# Patient Record
Sex: Male | Born: 1980
Health system: Southern US, Community
[De-identification: ages and names within clinical notes are randomized; demographics above are authoritative.]

## PROBLEM LIST (undated history)

## (undated) DIAGNOSIS — I10 Essential (primary) hypertension: Secondary | ICD-10-CM

## (undated) DIAGNOSIS — J45909 Unspecified asthma, uncomplicated: Secondary | ICD-10-CM

## (undated) DIAGNOSIS — G43909 Migraine, unspecified, not intractable, without status migrainosus: Secondary | ICD-10-CM

## (undated) DIAGNOSIS — E079 Disorder of thyroid, unspecified: Secondary | ICD-10-CM

## (undated) DIAGNOSIS — N809 Endometriosis, unspecified: Secondary | ICD-10-CM

## (undated) DIAGNOSIS — U071 COVID-19: Secondary | ICD-10-CM

## (undated) DIAGNOSIS — J40 Bronchitis, not specified as acute or chronic: Secondary | ICD-10-CM

## (undated) HISTORY — PX: ABDOMINAL HYSTERECTOMY: SHX81

## (undated) HISTORY — DX: Disorder of thyroid, unspecified: E07.9

## (undated) HISTORY — PX: ANKLE SURGERY: SHX546

---

## 2013-07-11 ENCOUNTER — Encounter (HOSPITAL_COMMUNITY): Payer: Self-pay

## 2013-07-11 ENCOUNTER — Emergency Department (HOSPITAL_COMMUNITY)
Admission: EM | Admit: 2013-07-11 | Discharge: 2013-07-11 | Discharge status: Against Medical Advice | Payer: Medicare Other | Attending: Emergency Medicine | Admitting: Emergency Medicine

## 2013-07-11 DIAGNOSIS — I10 Essential (primary) hypertension: Secondary | ICD-10-CM | POA: Insufficient documentation

## 2013-07-11 DIAGNOSIS — J45909 Unspecified asthma, uncomplicated: Secondary | ICD-10-CM | POA: Insufficient documentation

## 2013-07-11 DIAGNOSIS — J3489 Other specified disorders of nose and nasal sinuses: Secondary | ICD-10-CM | POA: Insufficient documentation

## 2013-07-11 HISTORY — DX: Essential (primary) hypertension: I10

## 2013-07-11 HISTORY — DX: Migraine, unspecified, not intractable, without status migrainosus: G43.909

## 2013-07-11 HISTORY — DX: Endometriosis, unspecified: N80.9

## 2013-07-11 HISTORY — DX: Unspecified asthma, uncomplicated: J45.909

## 2013-07-11 HISTORY — DX: Bronchitis, not specified as acute or chronic: J40

## 2013-07-11 NOTE — ED Notes (Signed)
Called pt, no answer.

## 2013-07-11 NOTE — ED Notes (Signed)
Called pt no answer °

## 2013-07-11 NOTE — ED Notes (Signed)
Pt c/o head cold and cough for several days, pt reports her BP was elevated per [facility], [facility] for [facility] evaluation. Pt denies taking her prescribed HTN medication, states "I just forget, then they gave me one at night that kept me up all night going to the bathroom." Pt took her prescribed BP as her endocrinologist suggested and now feels better. Pt requesting to leave, pt encouraged to stay and be evaluated. Pt denies chest pain, sob, dizziness, n/v

## 2013-08-01 ENCOUNTER — Emergency Department (HOSPITAL_COMMUNITY): Payer: Medicare Other

## 2013-08-01 ENCOUNTER — Emergency Department (HOSPITAL_COMMUNITY)
Admission: EM | Admit: 2013-08-01 | Discharge: 2013-08-01 | Disposition: A | Discharge status: Home / Self Care | Payer: Medicare Other | Attending: Emergency Medicine | Admitting: Emergency Medicine

## 2013-08-01 ENCOUNTER — Encounter (HOSPITAL_COMMUNITY): Payer: Self-pay | Admitting: *Deleted

## 2013-08-01 DIAGNOSIS — S93401A Sprain of unspecified ligament of right ankle, initial encounter: Secondary | ICD-10-CM

## 2013-08-01 DIAGNOSIS — G43909 Migraine, unspecified, not intractable, without status migrainosus: Secondary | ICD-10-CM | POA: Insufficient documentation

## 2013-08-01 DIAGNOSIS — S93409A Sprain of unspecified ligament of unspecified ankle, initial encounter: Secondary | ICD-10-CM | POA: Insufficient documentation

## 2013-08-01 DIAGNOSIS — I10 Essential (primary) hypertension: Secondary | ICD-10-CM | POA: Insufficient documentation

## 2013-08-01 DIAGNOSIS — Z79899 Other long term (current) drug therapy: Secondary | ICD-10-CM | POA: Insufficient documentation

## 2013-08-01 DIAGNOSIS — M25571 Pain in right ankle and joints of right foot: Secondary | ICD-10-CM

## 2013-08-01 DIAGNOSIS — Z791 Long term (current) use of non-steroidal anti-inflammatories (NSAID): Secondary | ICD-10-CM | POA: Insufficient documentation

## 2013-08-01 DIAGNOSIS — Y939 Activity, unspecified: Secondary | ICD-10-CM | POA: Insufficient documentation

## 2013-08-01 DIAGNOSIS — J45909 Unspecified asthma, uncomplicated: Secondary | ICD-10-CM | POA: Insufficient documentation

## 2013-08-01 DIAGNOSIS — X500XXA Overexertion from strenuous movement or load, initial encounter: Secondary | ICD-10-CM | POA: Insufficient documentation

## 2013-08-01 DIAGNOSIS — Y929 Unspecified place or not applicable: Secondary | ICD-10-CM | POA: Insufficient documentation

## 2013-08-01 MED ORDER — OXYCODONE-ACETAMINOPHEN 5-325 MG PO TABS: 1.0000 | ORAL_TABLET | Freq: Three times a day (TID) | ORAL | Status: DC | PRN

## 2013-08-01 NOTE — ED Provider Notes (Signed)
CSN: [id number]     Arrival date & time 08/01/13  1011 History   First MD Initiated Contact with Patient 08/01/13 1058     Chief Complaint  Patient presents with  . Ankle Pain   (Consider location/radiation/quality/duration/timing/severity/associated sxs/prior Treatment) The history is provided by the patient. No language interpreter was used.  Anthony Rowe is a 32 year old male presenting to the emergency apartment with right ankle pain that has been ongoing for the past month. Patient reported that she fell back in August 2014, reported that while she turned quickly and landed on her right ankle in an inverted manner. Patient reported that there is a constant sharp pain that is localized to the right ankle without radiation, reported that there is mild stiffness. Patient reports she was seen in [facility] where imaging was performed with negative findings-reported that she was not given any discharge papers or recommendations at that visit. Patient reports the pain worsens with walking with nothing making it better. Reported that she put on her own Ace bandage and has been using ibuprofen and Aleve as needed with minimal relief. Denied numbness, tingling, weakness, pain, previous injury to the right ankle. PCP none  Past Medical History  Diagnosis Date  . Endometriosis   . Hypertension   . Migraine   . Asthma   . Bronchitis    Past Surgical History  Procedure Laterality Date  . Ankle surgery     History reviewed. No pertinent family history. History  Substance Use Topics  . Smoking status: Never Smoker   . Smokeless tobacco: Not on file  . Alcohol Use: No    Review of Systems  Musculoskeletal: Positive for arthralgias (Right ankle).  Neurological: Negative for weakness and numbness.  All other systems reviewed and are negative.    Allergies  Sulfa antibiotics  Home Medications   Current Outpatient Rx  Name  Route  Sig  Dispense  Refill  . albuterol (PROVENTIL  HFA;VENTOLIN HFA) 108 (90 BASE) MCG/ACT inhaler   Inhalation   Inhale 2 puffs into the lungs every 6 (six) hours as needed for wheezing or shortness of breath.         Marland Kitchen aspirin-acetaminophen-caffeine (EXCEDRIN MIGRAINE) 250-250-65 MG per tablet   Oral   Take 2 tablets by mouth every 6 (six) hours as needed for pain (migraine).         . cyclobenzaprine (FLEXERIL) 5 MG tablet   Oral   Take 5 mg by mouth 3 (three) times daily as needed for muscle spasms.         . diphenhydrAMINE (BENADRYL) 25 MG tablet   Oral   Take 25 mg by mouth every 6 (six) hours as needed for sleep.         Marland Kitchen Doxylamine Succinate, Sleep, (SLEEP AID PO)   Oral   Take 4 tablets by mouth daily as needed (sleep).         Marland Kitchen lisinopril (PRINIVIL,ZESTRIL) 5 MG tablet   Oral   Take 5 mg by mouth daily.         . naproxen sodium (ANAPROX) 220 MG tablet   Oral   Take 440 mg by mouth 2 (two) times daily with a meal.         . TESTOSTERONE IM   Intramuscular   Inject 100 mg into the muscle once a week.         Marland Kitchen oxyCODONE-acetaminophen (PERCOCET/ROXICET) 5-325 MG per tablet   Oral   Take 1 tablet by  mouth every 8 (eight) hours as needed for pain.   7 tablet   0    BP 147/100  Pulse 99  Temp(Src) 98 F (36.7 C)  Resp 18  SpO2 100% Physical Exam  Nursing note and vitals reviewed. Constitutional: He is oriented to person, place, and time. He appears well-developed and well-nourished. No distress.  HENT:  Head: Normocephalic and atraumatic.  Neck: Normal range of motion. Neck supple.  Cardiovascular: Normal rate and normal heart sounds.  Exam reveals no friction rub.   No murmur heard. Pulses:      Radial pulses are 2+ on the right side, and 2+ on the left side.       Dorsalis pedis pulses are 2+ on the right side, and 2+ on the left side.  Pulmonary/Chest: Effort normal and breath sounds normal. No respiratory distress. He has no wheezes.  Musculoskeletal: He exhibits tenderness.  Mild  swelling noted to the lateral aspect of the right ankle. Negative signs of ecchymosis, inflammation, erythema, warmth upon palpation. Negative deformities identified. Discomfort with motion of the right ankle identified, most discomfort identified with eversion and inversion of the ankle. Full range of motion to the digits of the right foot. Discomfort upon palpation to the right ankle, lateral aspect mainly.  Neurological: He is alert and oriented to person, place, and time. He exhibits normal muscle tone. Coordination normal.  Strength 5+/5+ to lower extremities bilaterally with resistance applied, equal distribution identified. Sensation intact to upper and lower tremors bilaterally with differentiation to sharp and dull.  Skin: Skin is warm and dry. No rash noted. He is not diaphoretic. No erythema.  Psychiatric: He has a normal mood and affect. His behavior is normal. Thought content normal.    ED Course  Procedures (including critical care time) Labs Review Labs Reviewed - No data to display Imaging Review Dg Ankle Complete Right  08/01/2013   CLINICAL DATA:  Right ankle pain secondary to a fall.  EXAM: RIGHT ANKLE - COMPLETE 3+ VIEW  COMPARISON:  None.  FINDINGS: There is no evidence of fracture, dislocation, or joint effusion. There is no evidence of arthropathy or other focal bone abnormality. There is soft tissue swelling laterally.  IMPRESSION: No acute osseous abnormality. Lateral soft tissue swelling.   Electronically Signed   By: Geanie Cooley   On: 08/01/2013 12:05    MDM   1. Right ankle pain   2. Ankle sprain, right, initial encounter     Patient presenting to emergency Department with right ankle pain that has been ongoing for the past month, reported that she fell back in August 2014. Patient reported constant sharp shooting pain with stiffness to the right ankle without radiation. Reports she's been using Ace bandage for wrapping as well as Aleve and ibuprofen as needed with  minimal discomfort relief. Alert and oriented. Mild swelling noted to the lateral aspect of the right ankle, discomfort upon palpation to the right ankle. Full range of motion to the right ankle, mild discomfort noted with inversion and eversion of the right ankle. Strength intact. Pulses palpable and strong, distal and proximal. Sensation intact. Negative neurological deficits identified. Patient vascularly intact. Plain films of right ankle negative for acute fractures or subluxation. Suspicion to be possible ligamental injury, possible ligament sprain suspicion to be mainly anterior talofibular ligament. Patient placed in ankle brace and crutches given for comfort. Discharged patient with small dose of pain medications-discussed precautions, course, disposal. Discussed with patient to followup with orthopedics, referral given.  Discussed with patient to avoid any strenuous activities, discussed with patient to rest and stay hydrated. Discussed with patient to closely monitor symptoms and if symptoms are to worsen or change to report back to the emergency department - strict return instructions given. Patient agreed to plan of care, understood, all questions answered.   Raymon Mutton, PA-C 08/01/13 1746

## 2013-08-01 NOTE — ED Notes (Signed)
Right ankle appears swollen.

## 2013-08-01 NOTE — ED Notes (Signed)
PA at bedside.

## 2013-08-01 NOTE — ED Notes (Signed)
Pt reports falling in august, still having right ankle pain, no relief with ibuprofen. Has been seen at [facility] for same. Ambulatory at triage.

## 2013-08-02 NOTE — ED Provider Notes (Signed)
Medical screening examination/treatment/procedure(s) were performed by non-physician practitioner and as supervising physician I was immediately available for consultation/collaboration.  Flint Hakeem, MD 08/02/13 1139 

## 2013-09-18 ENCOUNTER — Emergency Department (INDEPENDENT_AMBULATORY_CARE_PROVIDER_SITE_OTHER)
Admission: EM | Admit: 2013-09-18 | Discharge: 2013-09-18 | Disposition: A | Discharge status: Home / Self Care | Payer: Medicare Other | Source: Home / Self Care | Attending: Emergency Medicine | Admitting: Emergency Medicine

## 2013-09-18 ENCOUNTER — Encounter (HOSPITAL_COMMUNITY): Payer: Self-pay | Admitting: Emergency Medicine

## 2013-09-18 DIAGNOSIS — T148XXA Other injury of unspecified body region, initial encounter: Secondary | ICD-10-CM

## 2013-09-18 NOTE — ED Provider Notes (Signed)
Medical screening examination/treatment/procedure(s) were performed by non-physician practitioner and as supervising physician I was immediately available for consultation/collaboration.  Leslee Home, M.D.  Reuben Likes, MD 09/18/13 [phone]

## 2013-09-18 NOTE — ED Provider Notes (Signed)
CSN: [id number]     Arrival date & time 09/18/13  1319 History   First MD Initiated Contact with Patient 09/18/13 1518     Chief Complaint  Patient presents with  . Cyst    upper right thigh since thursday.    (Consider location/radiation/quality/duration/timing/severity/associated sxs/prior Treatment) HPI Comments: Pt gives himself testosterone injections. Has never been taught to give them in the thigh but decided to try it after watching you tube videos.  Stuck himself 3 times with same needle in L upper leg but felt wasn't getting through skin deep enough so tried on the R side.  Site bled after needle was removed after injection.  Pt then developed large bruise around injection site and a hard tender lump at the injection site.  Has not attempted any treatments to help resolve lump.   The history is provided by the patient.    Past Medical History  Diagnosis Date  . Endometriosis   . Hypertension   . Migraine   . Asthma   . Bronchitis    Past Surgical History  Procedure Laterality Date  . Ankle surgery     History reviewed. No pertinent family history. History  Substance Use Topics  . Smoking status: Never Smoker   . Smokeless tobacco: Not on file  . Alcohol Use: No    Review of Systems  Constitutional: Negative for fever and chills.  Skin:       Lump in skin that is tender and large bruising around this    Allergies  Sulfa antibiotics  Home Medications   Current Outpatient Rx  Name  Route  Sig  Dispense  Refill  . albuterol (PROVENTIL HFA;VENTOLIN HFA) 108 (90 BASE) MCG/ACT inhaler   Inhalation   Inhale 2 puffs into the lungs every 6 (six) hours as needed for wheezing or shortness of breath.         . lisinopril (PRINIVIL,ZESTRIL) 5 MG tablet   Oral   Take 5 mg by mouth daily.         . TESTOSTERONE IM   Intramuscular   Inject 100 mg into the muscle once a week.         Marland Kitchen aspirin-acetaminophen-caffeine (EXCEDRIN MIGRAINE) 250-250-65 MG per  tablet   Oral   Take 2 tablets by mouth every 6 (six) hours as needed for pain (migraine).         . cyclobenzaprine (FLEXERIL) 5 MG tablet   Oral   Take 5 mg by mouth 3 (three) times daily as needed for muscle spasms.         . diphenhydrAMINE (BENADRYL) 25 MG tablet   Oral   Take 25 mg by mouth every 6 (six) hours as needed for sleep.         Marland Kitchen Doxylamine Succinate, Sleep, (SLEEP AID PO)   Oral   Take 4 tablets by mouth daily as needed (sleep).         . naproxen sodium (ANAPROX) 220 MG tablet   Oral   Take 440 mg by mouth 2 (two) times daily with a meal.         . oxyCODONE-acetaminophen (PERCOCET/ROXICET) 5-325 MG per tablet   Oral   Take 1 tablet by mouth every 8 (eight) hours as needed for pain.   7 tablet   0    BP 125/75  Pulse 96  Temp(Src) 98.2 F (36.8 C) (Oral)  Resp 16  SpO2 100% Physical Exam  Constitutional: He appears well-developed and well-nourished.  No distress.  Skin: Skin is warm and dry.     Skin on L upper thigh is normal.     ED Course  Procedures (including critical care time) Labs Review Labs Reviewed - No data to display Imaging Review No results found.  EKG Interpretation    Date/Time:    Ventricular Rate:    PR Interval:    QRS Duration:   QT Interval:    QTC Calculation:   R Axis:     Text Interpretation:              MDM   1. Hematoma   Most likely lump is hematoma. No evidence infection. Hematoma is fairly deep, not superficial. Pt to apply warm compresses 3-4 times per day for the next week. Return if not improving. Reviewed signs and sx of infection and reasons for returning sooner.     Cathlyn Parsons, NP 09/18/13 1525

## 2013-09-18 NOTE — ED Notes (Signed)
C/o knot on the upper right thigh since Thursday. Tender to touch. States giving self injection unsure if went to deep.  Denies any other symptoms.

## 2013-09-19 ENCOUNTER — Encounter (HOSPITAL_COMMUNITY): Payer: Self-pay | Admitting: Emergency Medicine

## 2013-09-19 ENCOUNTER — Emergency Department (HOSPITAL_COMMUNITY)
Admission: EM | Admit: 2013-09-19 | Discharge: 2013-09-19 | Disposition: A | Discharge status: Home / Self Care | Payer: Medicare Other | Attending: Emergency Medicine | Admitting: Emergency Medicine

## 2013-09-19 DIAGNOSIS — J45909 Unspecified asthma, uncomplicated: Secondary | ICD-10-CM | POA: Insufficient documentation

## 2013-09-19 DIAGNOSIS — Z8709 Personal history of other diseases of the respiratory system: Secondary | ICD-10-CM | POA: Insufficient documentation

## 2013-09-19 DIAGNOSIS — IMO0002 Reserved for concepts with insufficient information to code with codable children: Secondary | ICD-10-CM | POA: Insufficient documentation

## 2013-09-19 DIAGNOSIS — S7011XA Contusion of right thigh, initial encounter: Secondary | ICD-10-CM

## 2013-09-19 DIAGNOSIS — Z79899 Other long term (current) drug therapy: Secondary | ICD-10-CM | POA: Insufficient documentation

## 2013-09-19 DIAGNOSIS — I10 Essential (primary) hypertension: Secondary | ICD-10-CM | POA: Insufficient documentation

## 2013-09-19 DIAGNOSIS — Y849 Medical procedure, unspecified as the cause of abnormal reaction of the patient, or of later complication, without mention of misadventure at the time of the procedure: Secondary | ICD-10-CM | POA: Insufficient documentation

## 2013-09-19 NOTE — ED Provider Notes (Signed)
CSN: [id number]     Arrival date & time 09/19/13  0909 History   First MD Initiated Contact with Patient 09/19/13 [phone]     Chief Complaint  Patient presents with  . right leg pain    (Consider location/radiation/quality/duration/timing/severity/associated sxs/prior Treatment) The history is provided by the patient.   32 year old male currently undergoing testosterone injections for sex change. Patient seen in urgent care yesterday noted a area of hematoma at the site of the testosterone injections. No evidence of infection yesterday marked with skin marker. Patient returns here because she wanted a second opinion. No significant pain no fevers  Past Medical History  Diagnosis Date  . Endometriosis   . Hypertension   . Migraine   . Asthma   . Bronchitis    Past Surgical History  Procedure Laterality Date  . Ankle surgery     History reviewed. No pertinent family history. History  Substance Use Topics  . Smoking status: Never Smoker   . Smokeless tobacco: Not on file  . Alcohol Use: No    Review of Systems  Constitutional: Negative for fever.  HENT: Negative for congestion.   Eyes: Negative for redness.  Respiratory: Negative for shortness of breath.   Cardiovascular: Negative for chest pain.  Gastrointestinal: Negative for abdominal pain.  Genitourinary: Negative for dysuria.  Musculoskeletal: Negative for back pain.  Skin: Negative for rash.  Neurological: Negative for headaches.  Hematological: Does not bruise/bleed easily.  Psychiatric/Behavioral: Negative for confusion.    Allergies  Sulfa antibiotics  Home Medications   Current Outpatient Rx  Name  Route  Sig  Dispense  Refill  . albuterol (PROVENTIL HFA;VENTOLIN HFA) 108 (90 BASE) MCG/ACT inhaler   Inhalation   Inhale 2 puffs into the lungs every 6 (six) hours as needed for wheezing or shortness of breath.         . cyclobenzaprine (FLEXERIL) 5 MG tablet   Oral   Take 5 mg by mouth 3 (three) times  daily as needed for muscle spasms.         . diphenhydrAMINE (BENADRYL) 25 MG tablet   Oral   Take 25 mg by mouth every 6 (six) hours as needed for sleep.         Marland Kitchen Doxylamine Succinate, Sleep, (SLEEP AID PO)   Oral   Take 4 tablets by mouth daily as needed (sleep).         Marland Kitchen lisinopril (PRINIVIL,ZESTRIL) 5 MG tablet   Oral   Take 5 mg by mouth daily.         . TESTOSTERONE IM   Intramuscular   Inject 100 mg into the muscle once a week.          BP 138/87  Pulse 102  Temp(Src) 97.2 F (36.2 C) (Oral)  Resp 16  SpO2 100% Physical Exam  Nursing note and vitals reviewed. Constitutional: He is oriented to person, place, and time. He appears well-developed and well-nourished. No distress.  HENT:  Head: Normocephalic and atraumatic.  Eyes: Conjunctivae and EOM are normal.  Neck: Normal range of motion.  Cardiovascular: Normal rate, regular rhythm and normal heart sounds.   Pulmonary/Chest: Effort normal and breath sounds normal. No respiratory distress.  Abdominal: Soft. Bowel sounds are normal. There is no tenderness.  Neurological: He is alert and oriented to person, place, and time. No cranial nerve deficit. He exhibits normal muscle tone. Coordination normal.  Skin: Skin is warm.  Right anterior lateral fine with a 4-5 cm area of  bruising some induration consistent with a hematoma no evidence secondary infection no erythema no abscess cavity no fluctuance. Skin marks placed in urgent care yesterday no change in size.    ED Course  Procedures (including critical care time) Labs Review Labs Reviewed - No data to display Imaging Review No results found.  EKG Interpretation   None       MDM   1. Thigh hematoma, right, initial encounter    Patient seen in urgent care yesterday. Patient has a subcutaneous hematoma right anterior lateral fine based on the skin marks not enlarged since yesterday. today measuring approximately 4-5 cm. No evidence secondary  infection no evidence of abscess. No specific treatment required.    Shelda Jakes, MD 09/19/13 [phone]

## 2013-09-19 NOTE — ED Notes (Signed)
Pt given discharge paperwork. 

## 2013-09-19 NOTE — ED Notes (Signed)
Pt c/o right leg pain after attempting to give self testosterone injection last Wednesday. Is in process of sex-change with physician in [place]. Has "knot" on upper thigh-- marked with a circle today- told yesterday to get further checked out-- went to PT this am for right ankle-- was told that a note was needed prior to returning.

## 2013-09-19 NOTE — ED Notes (Signed)
Has knot on upper right thigh from Testosterone injection last Wednesday. Has positive pedal pulse.

## 2013-09-25 ENCOUNTER — Encounter: Payer: Self-pay | Admitting: Internal Medicine

## 2013-09-25 ENCOUNTER — Ambulatory Visit: Payer: Medicare Other | Attending: Internal Medicine | Admitting: Internal Medicine

## 2013-09-25 VITALS — BP 126/86 | HR 89 | Temp 98.2°F | Resp 16 | Ht 63.0 in | Wt 195.0 lb

## 2013-09-25 DIAGNOSIS — M25579 Pain in unspecified ankle and joints of unspecified foot: Secondary | ICD-10-CM | POA: Insufficient documentation

## 2013-09-25 DIAGNOSIS — E559 Vitamin D deficiency, unspecified: Secondary | ICD-10-CM

## 2013-09-25 DIAGNOSIS — M25572 Pain in left ankle and joints of left foot: Secondary | ICD-10-CM

## 2013-09-25 LAB — CMP AND LIVER
ALT: 12 U/L (ref 0–53)
Albumin: 4.2 g/dL (ref 3.5–5.2)
Alkaline Phosphatase: 67 U/L (ref 39–117)
BUN: 6 mg/dL (ref 6–23)
Bilirubin, Direct: 0.1 mg/dL (ref 0.0–0.3)
CO2: 29 mEq/L (ref 19–32)
Calcium: 9.5 mg/dL (ref 8.4–10.5)
Chloride: 104 mEq/L (ref 96–112)
Glucose, Bld: 81 mg/dL (ref 70–99)
Indirect Bilirubin: 0.4 mg/dL (ref 0.0–0.9)
Potassium: 3.9 mEq/L (ref 3.5–5.3)
Sodium: 139 mEq/L (ref 135–145)

## 2013-09-25 MED ORDER — ACETAMINOPHEN-CODEINE #3 300-30 MG PO TABS: 1.0000 | ORAL_TABLET | ORAL | Status: DC | PRN

## 2013-09-25 NOTE — Progress Notes (Signed)
Patient ID: Anthony Rowe, male   DOB: Jul 02, 1981, 32 y.o.   MRN: [id number] Patient Demographics  Anthony Rowe, is a 32 y.o. male  WUJ:[id number]  [id number]  DOB - [birth year]  CC:  Chief Complaint  Patient presents with  . Establish Care       HPI: Anthony Rowe is a 32 y.o. male here today to establish medical care. Patient is undergoing self testosterone injection for sex change to male. She noticed that her left ankle hurts. He has being seen in the ER and orthopedic clinic, x-ray was normal. He had an MRI done within the last week, still awaiting formal report. He is presently on Vicodin and will need a refill of medication. He has no other complaints. The bruises from previous testosterone injection is resolving. He does not smoke cigarette, he does not drink alcohol Patient has No headache, No chest pain, No abdominal pain - No Nausea, No new weakness tingling or numbness, No Cough - SOB.  Allergies  Allergen Reactions  . Sulfa Antibiotics Other (See Comments)    Makes patients legs numb    Past Medical History  Diagnosis Date  . Endometriosis   . Hypertension   . Migraine   . Asthma   . Bronchitis    Current Outpatient Prescriptions on File Prior to Visit  Medication Sig Dispense Refill  . albuterol (PROVENTIL HFA;VENTOLIN HFA) 108 (90 BASE) MCG/ACT inhaler Inhale 2 puffs into the lungs every 6 (six) hours as needed for wheezing or shortness of breath.      . cyclobenzaprine (FLEXERIL) 5 MG tablet Take 5 mg by mouth 3 (three) times daily as needed for muscle spasms.      . diphenhydrAMINE (BENADRYL) 25 MG tablet Take 25 mg by mouth every 6 (six) hours as needed for sleep.      Marland Kitchen Doxylamine Succinate, Sleep, (SLEEP AID PO) Take 4 tablets by mouth daily as needed (sleep).      Marland Kitchen lisinopril (PRINIVIL,ZESTRIL) 5 MG tablet Take 5 mg by mouth daily.      . TESTOSTERONE IM Inject 100 mg into the muscle once a week.       No current facility-administered medications  on file prior to visit.   History reviewed. No pertinent family history. History   Social History  . Marital Status: Married    Spouse Name: N/A    Number of Children: N/A  . Years of Education: N/A   Occupational History  . Not on file.   Social History Main Topics  . Smoking status: Never Smoker   . Smokeless tobacco: Not on file  . Alcohol Use: No  . Drug Use: No  . Sexual Activity: Not Currently   Other Topics Concern  . Not on file   Social History Narrative  . No narrative on file    Review of Systems: Constitutional: Negative for fever, chills, diaphoresis, activity change, appetite change and fatigue. HENT: Negative for ear pain, nosebleeds, congestion, facial swelling, rhinorrhea, neck pain, neck stiffness and ear discharge.  Eyes: Negative for pain, discharge, redness, itching and visual disturbance. Respiratory: Negative for cough, choking, chest tightness, shortness of breath, wheezing and stridor.  Cardiovascular: Negative for chest pain, palpitations and leg swelling. Gastrointestinal: Negative for abdominal distention. Genitourinary: Negative for dysuria, urgency, frequency, hematuria, flank pain, decreased urine volume, difficulty urinating and dyspareunia.  Musculoskeletal: Negative for back pain, joint swelling, arthralgia and gait problem. Neurological: Negative for dizziness, tremors, seizures, syncope, facial asymmetry, speech difficulty, weakness, light-headedness, numbness and  headaches.  Hematological: Negative for adenopathy. Does not bruise/bleed easily. Psychiatric/Behavioral: Negative for hallucinations, behavioral problems, confusion, dysphoric mood, decreased concentration and agitation.    Objective:   Filed Vitals:   09/25/13 1151  BP: 126/86  Pulse: 89  Temp: 98.2 F (36.8 C)  Resp: 16    Physical Exam: Constitutional: Patient appears well-developed and well-nourished. No distress. HENT: Normocephalic, atraumatic, External right  and left ear normal. Oropharynx is clear and moist.  Eyes: Conjunctivae and EOM are normal. PERRLA, no scleral icterus. Neck: Normal ROM. Neck supple. No JVD. No tracheal deviation. No thyromegaly. CVS: RRR, S1/S2 +, no murmurs, no gallops, no carotid bruit.  Pulmonary: Effort and breath sounds normal, no stridor, rhonchi, wheezes, rales.  Abdominal: Soft. BS +, no distension, tenderness, rebound or guarding.  Musculoskeletal: Normal range of motion. No edema and no tenderness.  Lymphadenopathy: No lymphadenopathy noted, cervical, inguinal or axillary Neuro: Alert. Normal reflexes, muscle tone coordination. No cranial nerve deficit. Skin: Skin is warm and dry. No rash noted. Not diaphoretic. No erythema. No pallor. Psychiatric: Normal mood and affect. Behavior, judgment, thought content normal.  No results found for this basename: WBC, HGB, HCT, MCV, PLT   No results found for this basename: CREATININE, BUN, NA, K, CL, CO2    No results found for this basename: HGBA1C   Lipid Panel  No results found for this basename: chol, trig, hdl, cholhdl, vldl, ldlcalc       Assessment and plan:   1. Left ankle pain - acetaminophen-codeine (TYLENOL #3) 300-30 MG per tablet; Take 1 tablet by mouth every 4 (four) hours as needed for moderate pain.  Dispense: 30 tablet; Refill: 0 - CMP and Liver We'll await the result of the MRI   Follow up in 3 months or when necessary   The patient was given clear instructions to go to ER or return to medical center if symptoms don't improve, worsen or new problems develop. The patient verbalized understanding. The patient was told to call to get lab results if they haven't heard anything in the next week.     Jeanann Lewandowsky, MD, MHA, FACP, FAAP [facility] [place], [place] [phone]   09/25/2013, 12:58 PM

## 2013-09-25 NOTE — Progress Notes (Signed)
Pt is here to establish care. Following up on on his right ankle pain. Pt gave himself an injection in his leg and the site is swollen and bruised.

## 2013-09-25 NOTE — Patient Instructions (Signed)
Ankle Pain  Ankle pain is a common symptom. The bones, cartilage, tendons, and muscles of the ankle joint perform a lot of work each day. The ankle joint holds your body weight and allows you to move around. Ankle pain can occur on either side or back of 1 or both ankles. Ankle pain may be sharp and burning or dull and aching. There may be tenderness, stiffness, redness, or warmth around the ankle. The pain occurs more often when a person walks or puts pressure on the ankle.  CAUSES   There are many reasons ankle pain can develop. It is important to work with your caregiver to identify the cause since many conditions can impact the bones, cartilage, muscles, and tendons. Causes for ankle pain include:  · Injury, including a break (fracture), sprain, or strain often due to a fall, sports, or a high-impact activity.  · Swelling (inflammation) of a tendon (tendonitis).  · Achilles tendon rupture.  · Ankle instability after repeated sprains and strains.  · Poor foot alignment.  · Pressure on a nerve (tarsal tunnel syndrome).  · Arthritis in the ankle or the lining of the ankle.  · Crystal formation in the ankle (gout or pseudogout).  DIAGNOSIS   A diagnosis is based on your medical history, your symptoms, results of your physical exam, and results of diagnostic tests. Diagnostic tests may include X-ray exams or a computerized magnetic scan (magnetic resonance imaging, MRI).  TREATMENT   Treatment will depend on the cause of your ankle pain and may include:  · Keeping pressure off the ankle and limiting activities.  · Using crutches or other walking support (a cane or brace).  · Using rest, ice, compression, and elevation.  · Participating in physical therapy or home exercises.  · Wearing shoe inserts or special shoes.  · Losing weight.  · Taking medications to reduce pain or swelling or receiving an injection.  · Undergoing surgery.  HOME CARE INSTRUCTIONS   · Only take over-the-counter or prescription medicines for  pain, discomfort, or fever as directed by your caregiver.  · Put ice on the injured area.  · Put ice in a plastic bag.  · Place a towel between your skin and the bag.  · Leave the ice on for 15-20 minutes at a time, 03-04 times a day.  · Keep your leg raised (elevated) when possible to lessen swelling.  · Avoid activities that cause ankle pain.  · Follow specific exercises as directed by your caregiver.  · Record how often you have ankle pain, the location of the pain, and what it feels like. This information may be helpful to you and your caregiver.  · Ask your caregiver about returning to work or sports and whether you should drive.  · Follow up with your caregiver for further examination, therapy, or testing as directed.  SEEK MEDICAL CARE IF:   · Pain or swelling continues or worsens beyond 1 week.  · You have an oral temperature above 102° F (38.9° C).  · You are feeling unwell or have chills.  · You are having an increasingly difficult time with walking.  · You have loss of sensation or other new symptoms.  · You have questions or concerns.  MAKE SURE YOU:   · Understand these instructions.  · Will watch your condition.  · Will get help right away if you are not doing well or get worse.  Document Released: 04/08/2010 Document Revised: 01/11/2012 Document Reviewed: 04/08/2010  [employer]®   Patient Information ©2014 [employer].

## 2013-10-04 ENCOUNTER — Telehealth: Payer: Self-pay | Admitting: Emergency Medicine

## 2013-10-04 NOTE — Telephone Encounter (Signed)
Pt given lab results 

## 2013-10-04 NOTE — Telephone Encounter (Signed)
Message copied by Darlis Loan on Wed Oct 04, 2013  6:01 PM ------      Message from: Jeanann Lewandowsky E      Created: Wed Oct 04, 2013  2:51 PM       Please let patient know that his comprehensive metabolic panel is normal ------

## 2013-12-26 ENCOUNTER — Ambulatory Visit: Payer: Medicare Other | Admitting: Internal Medicine

## 2014-10-04 ENCOUNTER — Ambulatory Visit (INDEPENDENT_AMBULATORY_CARE_PROVIDER_SITE_OTHER): Payer: Medicare Other

## 2014-10-04 ENCOUNTER — Ambulatory Visit (INDEPENDENT_AMBULATORY_CARE_PROVIDER_SITE_OTHER): Payer: Medicare Other | Admitting: Family Medicine

## 2014-10-04 ENCOUNTER — Emergency Department (HOSPITAL_COMMUNITY)
Admission: EM | Admit: 2014-10-04 | Discharge: 2014-10-04 | Disposition: A | Discharge status: Home / Self Care | Payer: Self-pay

## 2014-10-04 VITALS — BP 144/88 | HR 86 | Temp 98.2°F | Resp 16 | Ht 63.0 in | Wt 200.8 lb

## 2014-10-04 DIAGNOSIS — S46911A Strain of unspecified muscle, fascia and tendon at shoulder and upper arm level, right arm, initial encounter: Secondary | ICD-10-CM

## 2014-10-04 DIAGNOSIS — M25511 Pain in right shoulder: Secondary | ICD-10-CM

## 2014-10-04 NOTE — Patient Instructions (Signed)
Take naproxen 500 mg twice daily  Take the Flexeril 5 mg (cyclobenzaprine) one at bedtime  You will be contacted about the physical therapy referral. If you do not hear from us by 5 or 6 days from now call and ask if the referral is in progress.  If problems persist we will refer you to an orthopedist.
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
## 2014-10-15 ENCOUNTER — Telehealth: Payer: Self-pay | Admitting: *Deleted

## 2014-10-15 ENCOUNTER — Ambulatory Visit: Payer: Medicare Other | Attending: Internal Medicine | Admitting: Physical Therapy

## 2014-10-15 DIAGNOSIS — M25611 Stiffness of right shoulder, not elsewhere classified: Secondary | ICD-10-CM | POA: Insufficient documentation

## 2014-10-15 DIAGNOSIS — M25511 Pain in right shoulder: Secondary | ICD-10-CM | POA: Insufficient documentation

## 2014-10-15 DIAGNOSIS — G8929 Other chronic pain: Secondary | ICD-10-CM

## 2014-10-15 NOTE — Patient Instructions (Signed)
SHOULDER: External Rotation - Supine (Cane)   Hold cane with both hands. Rotate arm away from body. Keep elbow on floor and next to body. _10__ reps per set, _1-2_ sets per day. Add towel to keep elbow at side.  Copyright  VHI. All rights reserved.  Cane Horizontal - Supine   With straight arms holding cane above shoulders, bring cane out to right, center, out to left, and back to above head. Repeat _10__ times. Do _1-2__ times per day.  Copyright  VHI. All rights reserved.  Cane Exercise: Flexion   Lie on back, holding cane above chest. Keeping arms as straight as possible, lower cane toward floor beyond head. Repeat _10___ times. Do _1-2___ sessions per day.  http://[email or web address].exer.us/91   Copyright  VHI. All rights reserved.   Anthony Rowe, PT, DPT 10/15/2014 9:08 AM  [facility] [address] [place], [place] [address]  [phone] (office) [phone] (fax)
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
## 2014-10-15 NOTE — Telephone Encounter (Signed)
appts made and printed...td 

## 2014-10-19 ENCOUNTER — Ambulatory Visit: Payer: Medicare Other | Admitting: Physical Therapy

## 2014-10-19 NOTE — Therapy (Signed)
[facility] [address] [place], [place], [address] Phone: [phone]   Fax:  [phone]  Patient Details  Name: Anthony Rowe MRN: [id number] Date of Birth: [birth year]  Encounter Date: 10/19/2014 Patient arrived today for appt and stated he did not fell well. Apparently he took pain meds on an empty stomach this am.  He tried to eat on the way to therapy and he was sick in the clinic.  R/S appt. No charge.  Karie MainlandJennifer Krist Rosenboom, PT 10/19/2014 11:21 AM Phone: [phone] Fax: [phone]   Anthony Rowe 10/19/2014, 11:19 AM  [facility] [address] [place], [place], [address] Phone: [phone]   Fax:  [phone]

## 2014-10-23 ENCOUNTER — Ambulatory Visit: Payer: Medicare Other | Admitting: Rehabilitation

## 2014-10-23 DIAGNOSIS — G8929 Other chronic pain: Secondary | ICD-10-CM

## 2014-10-23 DIAGNOSIS — M25511 Pain in right shoulder: Principal | ICD-10-CM

## 2014-10-23 DIAGNOSIS — M25611 Stiffness of right shoulder, not elsewhere classified: Secondary | ICD-10-CM

## 2014-10-23 NOTE — Therapy (Addendum)
[place], [place], [address] Phone: [phone]   Fax:  [phone]  Physical Therapy Treatment  Patient Details  Name: Anthony Rowe MRN: [id number] Date of Birth: 1981-05-30  Encounter Date: 10/23/2014      PT End of Session - 10/23/14 1109    Visit Number 2   Number of Visits 12   Date for PT Re-Evaluation 12/14/14   PT Start Time 1105      Past Medical History  Diagnosis Date  . Endometriosis   . Hypertension   . Migraine   . Asthma   . Bronchitis     Past Surgical History  Procedure Laterality Date  . Ankle surgery      There were no vitals taken for this visit.  Visit Diagnosis:  Chronic right shoulder pain  Shoulder stiffness, right      Subjective Assessment - 10/23/14 1107    Symptoms not painful when propped at rest, increased with using UE, I dont use it unless I have to. I keep my hand in my pocket   Currently in Pain? Yes   Pain Score 8    Pain Location Shoulder   Pain Orientation Right   Pain Descriptors / Indicators Throbbing   Pain Type Chronic pain   Pain Frequency Constant   Aggravating Factors  using the RUE   Pain Relieving Factors not using it          [facility] PT Assessment - 10/23/14 1113    AROM   Right Shoulder Flexion 130 Degrees   Right Shoulder ABduction 108 Degrees   Right Shoulder Internal Rotation --  T8   Right Shoulder External Rotation --  t1                  [facility] Adult PT Treatment/Exercise - 10/23/14 1126    Shoulder Exercises: Supine   Other Supine Exercises Supine Cane press up, pullover, ER, Horiz Abdct x10 each with end range stretch   Shoulder Exercises: Standing   Row Strengthening;Both;10 reps;Theraband   Theraband Level (Shoulder Row) Level 2 (Red)   Other Standing Exercises wall ladder x 5 flexion   Other Standing Exercises Standing Cane x 10 each   Shoulder Exercises: Pulleys   Flexion 2 minutes   ABduction 2  minutes   Shoulder Exercises: ROM/Strengthening   Other ROM/Strengthening Exercises door way stretch for ER 3 x 30   Modalities   Modalities Cryotherapy   Cryotherapy   Number Minutes Cryotherapy 15 Minutes   Cryotherapy Location Shoulder   Type of Cryotherapy Ice pack                PT Education - 10/23/14 1146    Education provided Yes   Education Details HEP   Person(s) Educated Patient   Methods Explanation;Handout   Comprehension Verbalized understanding             PT Long Term Goals - 10/23/14 1117    PT LONG TERM GOAL #1   Title independent with HEP (11/26/14)   Time 6   Period Weeks   Status On-going   PT LONG TERM GOAL #2   Title improve RUE AROM to [facility] (11/26/14)   Time 6   Period Weeks   Status On-going   PT LONG TERM GOAL #3   Title report pain < 7/10 with activity and exercise (11/26/14)   Time 6   Period Weeks   Status On-going   PT LONG TERM  GOAL #4   Title verbalize understanding of posture and body mechanics to reduce risk of reinjury (11/26/14)   Time 6   Period Weeks   Status On-going               Plan - 10/23/14 1142    Clinical Impression Statement able to complete all therex without complaints of increased pain in clinic today, no goals met   PT Next Visit Plan add strengthening, UBE        Problem List Patient Active Problem List   Diagnosis Date Noted  . Left ankle pain 09/25/2013  . Hypovitaminosis D 09/25/2013    Dorene Ar, PTA 10/23/2014, 11:46 AM  [place], [place], [address] Phone: [phone]   Fax:  [phone]     PHYSICAL THERAPY DISCHARGE SUMMARY  Visits from Start of Care: 2  Current functional level related to goals / functional outcomes: See above; no goals met at pt only attended 2 sessions.  Pt did not show for remaining scheduled appointments.   Remaining deficits: unknown   Education /  Equipment: HEP  Plan: Patient agrees to discharge.  Patient goals were not met. Patient is being discharged due to not returning since the last visit.  ?????    Laureen Abrahams, PT, DPT 01/04/2015 9:20 AM  [facility] [address] [place] [address]  [phone] (office) [phone] (fax)

## 2014-10-23 NOTE — Patient Instructions (Signed)
Flexion (Eccentric) - Active-Assist (Cane)   Use unaffected arm to push affected arm forward. Avoid hiking shoulder. Keep palm relaxed. Slowly lower affected arm for 3-5 seconds, increasing use of affected arm. __10_ reps per set, __2_ sets per day, __7_ days per week. A  Copyright  VHI. All rights reserved  Cane Exercise: Abduction   Hold cane with right hand over end, palm-up, with other hand palm-down. Move arm out from side and up by pushing with other arm. Hold _51___ seconds. Repeat _10_x2__ times. Do __2__ sessions per day.  http://[email or web address].exer.us/81   Copyright  VHI. All rights reserved.      Cane Exercise: Extension / Internal Rotation   Stand holding cane behind back with both hands palm-up. Slide cane up spine toward head. Hold __5__ seconds. Repeat __10x2__ times. Do ___2_ sessions per day.  http://[email or web address].exer.us/85   Copyright  VHI. All rights reserved.  Gilmer Mor.  Cane Exercise: Extension   Stand holding cane behind back with both hands palm-up. Lift the cane away from body. Hold _5___ seconds. Repeat _10x2___ times. Do __2__ sessions per day.  http://[email or web address].exer.us/83   Copyright  VHI. All rights reserved.

## 2014-10-25 ENCOUNTER — Ambulatory Visit: Payer: Medicare Other | Admitting: Physical Therapy

## 2014-10-30 ENCOUNTER — Ambulatory Visit: Payer: Medicare Other | Admitting: Rehabilitation

## 2015-02-04 ENCOUNTER — Emergency Department (HOSPITAL_COMMUNITY)
Admission: EM | Admit: 2015-02-04 | Discharge: 2015-02-04 | Disposition: A | Discharge status: Home / Self Care | Payer: 59 | Attending: Emergency Medicine | Admitting: Emergency Medicine

## 2015-02-04 ENCOUNTER — Encounter (HOSPITAL_COMMUNITY): Payer: Self-pay | Admitting: Emergency Medicine

## 2015-02-04 DIAGNOSIS — S0990XA Unspecified injury of head, initial encounter: Secondary | ICD-10-CM | POA: Insufficient documentation

## 2015-02-04 DIAGNOSIS — S300XXA Contusion of lower back and pelvis, initial encounter: Secondary | ICD-10-CM | POA: Diagnosis not present

## 2015-02-04 DIAGNOSIS — G43909 Migraine, unspecified, not intractable, without status migrainosus: Secondary | ICD-10-CM | POA: Insufficient documentation

## 2015-02-04 DIAGNOSIS — S4991XA Unspecified injury of right shoulder and upper arm, initial encounter: Secondary | ICD-10-CM | POA: Diagnosis not present

## 2015-02-04 DIAGNOSIS — W108XXA Fall (on) (from) other stairs and steps, initial encounter: Secondary | ICD-10-CM | POA: Diagnosis not present

## 2015-02-04 DIAGNOSIS — J45909 Unspecified asthma, uncomplicated: Secondary | ICD-10-CM | POA: Diagnosis not present

## 2015-02-04 DIAGNOSIS — G44309 Post-traumatic headache, unspecified, not intractable: Secondary | ICD-10-CM

## 2015-02-04 DIAGNOSIS — Y998 Other external cause status: Secondary | ICD-10-CM | POA: Insufficient documentation

## 2015-02-04 DIAGNOSIS — M25511 Pain in right shoulder: Secondary | ICD-10-CM

## 2015-02-04 DIAGNOSIS — Z79899 Other long term (current) drug therapy: Secondary | ICD-10-CM | POA: Insufficient documentation

## 2015-02-04 DIAGNOSIS — Y9389 Activity, other specified: Secondary | ICD-10-CM | POA: Diagnosis not present

## 2015-02-04 DIAGNOSIS — S4992XA Unspecified injury of left shoulder and upper arm, initial encounter: Secondary | ICD-10-CM | POA: Diagnosis not present

## 2015-02-04 DIAGNOSIS — F0781 Postconcussional syndrome: Secondary | ICD-10-CM | POA: Diagnosis not present

## 2015-02-04 DIAGNOSIS — Y9289 Other specified places as the place of occurrence of the external cause: Secondary | ICD-10-CM | POA: Diagnosis not present

## 2015-02-04 DIAGNOSIS — W19XXXA Unspecified fall, initial encounter: Secondary | ICD-10-CM

## 2015-02-04 DIAGNOSIS — G8929 Other chronic pain: Secondary | ICD-10-CM

## 2015-02-04 DIAGNOSIS — I10 Essential (primary) hypertension: Secondary | ICD-10-CM | POA: Diagnosis not present

## 2015-02-04 DIAGNOSIS — S3992XA Unspecified injury of lower back, initial encounter: Secondary | ICD-10-CM | POA: Diagnosis present

## 2015-02-04 DIAGNOSIS — M25512 Pain in left shoulder: Secondary | ICD-10-CM

## 2015-02-04 NOTE — ED Notes (Signed)
Pt from home. Pt reports he had a fall last Tuesday (01/29/15). He reports while on Xanax and and Hydrocodone he fell on a stair in his home. He hit his coccyx and the back of his head. He has seen his primary care provider already for this. His pain is untouched by the hydrocodone he is already on for chronic back problems. He reports his migraines have gotten worse, but there were no changes in vision when he his his head, nor was there a loss of consciousness. Neuro assessment was unremarkable. He has started taking a new medication otc for weight loss and reports that he does not drink much water.
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
## 2015-02-04 NOTE — ED Provider Notes (Signed)
CSN: [id number]     Arrival date & time 02/04/15  1041 History   First MD Initiated Contact with Patient 02/04/15 1151     Chief Complaint  Patient presents with  . Fall     (Consider location/radiation/quality/duration/timing/severity/associated sxs/prior Treatment) HPI Comments: Bartholomew Padula is a 34 y.o. male with a PMHx of HTN, migraines, chronic back pain, chronic shoulder pain, and asthma, who presents to the ED with complaints of fall one week ago on 01/29/15 down proximally 4 steps landing on his coccyx and hitting his right shoulder and the back of his head. He states initially he experienced no pain, but he had gradual onset 8/10 constant sharp nonradiating pain in his coccyx and shoulder, worse with movement, and unrelieved with heat. He reports that he takes oxycodone at home given to him by his regular doctor, with some relief in his symptoms. He also reports that he has had intermittent migraines which she reports are the same as his typical migraines, completely resolved at this time, and that recently started taking Hydroxycut to lose weight and that these have worsened his migraines. He denies any loss of consciousness, confusion or altered mental status, fevers, neck stiffness, chest pain shortness of breath, abdominal pain, nausea, vomiting, diarrhea, constipation, dysuria, hematuria, incontinence of urine or stool, numbness, tingling, weakness, vision changes, dizziness, or lightheadedness. He states that he went to physical therapy for his shoulders in the past, but did not have any relief and was told that he would be referred to an orthopedist here but he has not yet followed up. He is requesting a orthopedics referral. Not on blood thinners  Patient is a 34 y.o. male presenting with fall. The history is provided by the patient. No language interpreter was used.  Fall This is a new problem. The current episode started in the past 7 days. The problem occurs rarely. The problem has  been rapidly improving. Associated symptoms include arthralgias (coccyx, R shoulder) and headaches (intermittent, none now). Pertinent negatives include no abdominal pain, chest pain, chills, fever, joint swelling, myalgias, nausea, neck pain, numbness, visual change, vomiting or weakness. Exacerbated by: movement. He has tried heat and oral narcotics for the symptoms. The treatment provided mild relief.    Past Medical History  Diagnosis Date  . Endometriosis   . Hypertension   . Migraine   . Asthma   . Bronchitis    Past Surgical History  Procedure Laterality Date  . Ankle surgery     History reviewed. No pertinent family history. History  Substance Use Topics  . Smoking status: Never Smoker   . Smokeless tobacco: Not on file  . Alcohol Use: No    Review of Systems  Constitutional: Negative for fever and chills.  Eyes: Negative for photophobia and visual disturbance.  Respiratory: Negative for shortness of breath.   Cardiovascular: Negative for chest pain.  Gastrointestinal: Negative for nausea, vomiting, abdominal pain, diarrhea and constipation.  Genitourinary: Negative for dysuria and hematuria.  Musculoskeletal: Positive for back pain (chronic and unchanged) and arthralgias (coccyx, R shoulder). Negative for myalgias, joint swelling, neck pain and neck stiffness.  Skin: Negative for color change and wound.  Neurological: Positive for headaches (intermittent, none now). Negative for dizziness, syncope, weakness, light-headedness and numbness.  Psychiatric/Behavioral: Negative for confusion.   10 Systems reviewed and are negative for acute change except as noted in the HPI.    Allergies  Frovatriptan; Sulfa antibiotics; and Tylenol  Home Medications   Prior to Admission medications  Medication Sig Start Date End Date Taking? Authorizing Provider  albuterol (PROVENTIL HFA;VENTOLIN HFA) 108 (90 BASE) MCG/ACT inhaler Inhale 2 puffs into the lungs every 6 (six) hours as  needed for wheezing or shortness of breath.   Yes Historical Provider, MD  alprazolam Prudy Feeler) 2 MG tablet Take 2 mg by mouth at bedtime as needed for anxiety.   Yes Historical Provider, MD  ibuprofen (ADVIL,MOTRIN) 600 MG tablet Take 600 mg by mouth every 6 (six) hours as needed for headache, mild pain or moderate pain.   Yes Historical Provider, MD  ibuprofen (ADVIL,MOTRIN) 800 MG tablet Take 800 mg by mouth every 8 (eight) hours as needed for headache, mild pain or moderate pain.   Yes Historical Provider, MD  lisinopril (PRINIVIL,ZESTRIL) 10 MG tablet Take 10 mg by mouth daily.   Yes Historical Provider, MD  Oxycodone HCl 10 MG TABS Take 10 mg by mouth every 8 (eight) hours. 01/28/15  Yes Historical Provider, MD  oxyCODONE-acetaminophen (PERCOCET) 10-325 MG per tablet Take 1 tablet by mouth every 8 (eight) hours as needed for pain.   Yes Historical Provider, MD  TESTOSTERONE IM Inject 100 mg into the muscle once a week.   Yes Historical Provider, MD  tiZANidine (ZANAFLEX) 4 MG tablet Take 4 mg by mouth every 6 (six) hours as needed for muscle spasms.   Yes Historical Provider, MD  traZODone (DESYREL) 100 MG tablet Take 200 mg by mouth at bedtime.   Yes Historical Provider, MD   BP 144/107 mmHg  Pulse 87  Temp(Src) 98.1 F (36.7 C) (Oral)  Resp 18  SpO2 98% Physical Exam  Constitutional: He is oriented to person, place, and time. Vital signs are normal. He appears well-developed and well-nourished.  Non-toxic appearance. No distress.  Afebrile, nontoxic, NAD, mild HTN noted  HENT:  Head: Normocephalic and atraumatic. Head is without abrasion, without contusion and without laceration.  Mouth/Throat: Oropharynx is clear and moist and mucous membranes are normal.  [place]/AT, no contusions or abrasions, no scalp tenderness or crepitus  Eyes: Conjunctivae and EOM are normal. Pupils are equal, round, and reactive to light. Right eye exhibits no discharge. Left eye exhibits no discharge.  PERRL, EOMI,  no nystagmus  Neck: Normal range of motion. Neck supple. No spinous process tenderness and no muscular tenderness present. No rigidity. Normal range of motion present.  FROM intact without spinous process or paraspinous muscle TTP, no bony stepoffs or deformities, no muscle spasms. No rigidity or meningeal signs. No bruising or swelling.   Cardiovascular: Normal rate, regular rhythm, normal heart sounds and intact distal pulses.  Exam reveals no gallop and no friction rub.   No murmur heard. Pulmonary/Chest: Effort normal and breath sounds normal. No respiratory distress. He has no decreased breath sounds. He has no wheezes. He has no rhonchi. He has no rales.  Abdominal: Soft. Normal appearance and bowel sounds are normal. He exhibits no distension. There is no tenderness. There is no rigidity, no rebound and no guarding.  Musculoskeletal: Normal range of motion.       Right shoulder: He exhibits tenderness. He exhibits normal range of motion, no bony tenderness, no swelling, no crepitus, no deformity, no spasm, normal pulse and normal strength.  R shoulder with FROM intact, no bony TTP, mild diffuse muscular TTP without spasms, no swelling/effusion, no crepitus/deformity, negative apley scratch, neg pain with resisted int/ext rotation, neg empty can test, neg cross arm test. Strength and sensation grossly intact in all extremities, distal pulses intact.  All spinal levels nonTTP without bony step offs. Gait steady.   Neurological: He is alert and oriented to person, place, and time. He has normal strength. No cranial nerve deficit or sensory deficit. Coordination and gait normal. GCS eye subscore is 4. GCS verbal subscore is 5. GCS motor subscore is 6.  CN 2-12 grossly intact A&O x4 GCS 15 Sensation and strength intact Gait nonataxic Coordination intact  Skin: Skin is warm, dry and intact. No rash noted.  Psychiatric: He has a normal mood and affect.  Nursing note and vitals reviewed.   ED  Course  Procedures (including critical care time) Labs Review Labs Reviewed - No data to display  Imaging Review No results found.   EKG Interpretation None      MDM   Final diagnoses:  Chronic pain of both shoulders  Contusion of coccyx, initial encounter  Post-concussion headache  Fall, initial encounter    34 y.o. male here with fall 1wk ago down 4 steps, gradual onset worsening of chronic low back pain, and R shoulder pain. Also has had intermittent migraines, none today. Has already seen his regular doctor and has pain meds at home. No red flag s/s of low back pain. No s/s of central cord compression or cauda equina. Lower extremities are neurovascularly intact and patient is ambulating without difficulty. R shoulder with FROM intact, strength and sensation grossly intact. Doubt need for imaging. Chart review reveals pt no-showed to his appointments. He is requesting ortho referral, which I think may be the next step despite being noncompliant with PT. No red flag s/sx for headaches, none ongoing now, neuro exam benign, no focal deficits, doubt need for CT.  Patient was counseled on back pain precautions and told to do activity as tolerated but do not lift, push, or pull heavy objects more than 10 pounds for the next week. Patient counseled to use ice or heat on back for no longer than 15 minutes every hour. Counseled on headache remedies/lifestyle modifications. Discussed ice/heat use and his home pain meds. Will have him f/up with PCP for chronic pain management and migraine management. Pt declines meds here. Patient urged to follow-up with PCP if pain does not improve with treatment and rest or if pain becomes recurrent. Urged to return with worsening severe pain, loss of bowel or bladder control, trouble walking. The patient verbalizes understanding and agrees with the plan.   BP 137/96 mmHg  Pulse 80  Temp(Src) 98.4 F (36.9 C) (Oral)  Resp 18  SpO2 100%   Laurance Heide  Camprubi-Soms, PA-C 02/04/15 1228  Toy CookeyMegan Docherty, MD 02/06/15 2044

## 2015-02-04 NOTE — ED Notes (Signed)
Pt not in lobby when called for triage.  

## 2015-02-04 NOTE — Discharge Instructions (Signed)
Continue taking your regular medications at home for pain. Avoid hydroxycut as this can make your headaches worse. Use heat or ice as needed for pain. Follow up with the orthopedist for your shoulder pain, and with your regular doctor regarding your chronic migraines. Return to the ER for changes or worsening symptoms.   Contusion A contusion is a deep bruise. Contusions happen when an injury causes bleeding under the skin. Signs of bruising include pain, puffiness (swelling), and discolored skin. The contusion may turn blue, purple, or yellow. HOME CARE   Put ice on the injured area.  Put ice in a plastic bag.  Place a towel between your skin and the bag.  Leave the ice on for 15-20 minutes, 03-04 times a day.  Only take medicine as told by your doctor.  Rest the injured area.  If possible, raise (elevate) the injured area to lessen puffiness. GET HELP RIGHT AWAY IF:   You have more bruising or puffiness.  You have pain that is getting worse.  Your puffiness or pain is not helped by medicine. MAKE SURE YOU:   Understand these instructions.  Will watch your condition.  Will get help right away if you are not doing well or get worse. Document Released: 04/06/2008 Document Revised: 01/11/2012 Document Reviewed: 08/24/2011 [facility] Patient Information 2015 [place], [place]. This information is not intended to replace advice given to you by your health care provider. Make sure you discuss any questions you have with your health care provider.  Cryotherapy Cryotherapy means treatment with cold. Ice or gel packs can be used to reduce both pain and swelling. Ice is the most helpful within the first 24 to 48 hours after an injury or flare-up from overusing a muscle or joint. Sprains, strains, spasms, burning pain, shooting pain, and aches can all be eased with ice. Ice can also be used when recovering from surgery. Ice is effective, has very few side effects, and is safe for most people  to use. PRECAUTIONS  Ice is not a safe treatment option for people with:  Raynaud phenomenon. This is a condition affecting small blood vessels in the extremities. Exposure to cold may cause your problems to return.  Cold hypersensitivity. There are many forms of cold hypersensitivity, including:  Cold urticaria. Red, itchy hives appear on the skin when the tissues begin to warm after being iced.  Cold erythema. This is a red, itchy rash caused by exposure to cold.  Cold hemoglobinuria. Red blood cells break down when the tissues begin to warm after being iced. The hemoglobin that carry oxygen are passed into the urine because they cannot combine with blood proteins fast enough.  Numbness or altered sensitivity in the area being iced. If you have any of the following conditions, do not use ice until you have discussed cryotherapy with your caregiver:  Heart conditions, such as arrhythmia, angina, or chronic heart disease.  High blood pressure.  Healing wounds or open skin in the area being iced.  Current infections.  Rheumatoid arthritis.  Poor circulation.  Diabetes. Ice slows the blood flow in the region it is applied. This is beneficial when trying to stop inflamed tissues from spreading irritating chemicals to surrounding tissues. However, if you expose your skin to cold temperatures for too long or without the proper protection, you can damage your skin or nerves. Watch for signs of skin damage due to cold. HOME CARE INSTRUCTIONS Follow these tips to use ice and cold packs safely.  Place a dry  or damp towel between the ice and skin. A damp towel will cool the skin more quickly, so you may need to shorten the time that the ice is used.  For a more rapid response, add gentle compression to the ice.  Ice for no more than 10 to 20 minutes at a time. The bonier the area you are icing, the less time it will take to get the benefits of ice.  Check your skin after 5 minutes to  make sure there are no signs of a poor response to cold or skin damage.  Rest 20 minutes or more between uses.  Once your skin is numb, you can end your treatment. You can test numbness by very lightly touching your skin. The touch should be so light that you do not see the skin dimple from the pressure of your fingertip. When using ice, most people will feel these normal sensations in this order: cold, burning, aching, and numbness.  Do not use ice on someone who cannot communicate their responses to pain, such as small children or people with dementia. HOW TO MAKE AN ICE PACK Ice packs are the most common way to use ice therapy. Other methods include ice massage, ice baths, and cryosprays. Muscle creams that cause a cold, tingly feeling do not offer the same benefits that ice offers and should not be used as a substitute unless recommended by your caregiver. To make an ice pack, do one of the following:  Place crushed ice or a bag of frozen vegetables in a sealable plastic bag. Squeeze out the excess air. Place this bag inside another plastic bag. Slide the bag into a pillowcase or place a damp towel between your skin and the bag.  Mix 3 parts water with 1 part rubbing alcohol. Freeze the mixture in a sealable plastic bag. When you remove the mixture from the freezer, it will be slushy. Squeeze out the excess air. Place this bag inside another plastic bag. Slide the bag into a pillowcase or place a damp towel between your skin and the bag. SEEK MEDICAL CARE IF:  You develop white spots on your skin. This may give the skin a blotchy (mottled) appearance.  Your skin turns blue or pale.  Your skin becomes waxy or hard.  Your swelling gets worse. MAKE SURE YOU:   Understand these instructions.  Will watch your condition.  Will get help right away if you are not doing well or get worse. Document Released: 06/15/2011 Document Revised: 03/05/2014 Document Reviewed: 06/15/2011 [facility]  Patient Information 2015 [place]. This information is not intended to replace advice given to you by your health care provider. Make sure you discuss any questions you have with your health care provider.  Musculoskeletal Pain Musculoskeletal pain is muscle and boney aches and pains. These pains can occur in any part of the body. Your caregiver may treat you without knowing the cause of the pain. They may treat you if blood or urine tests, X-rays, and other tests were normal.  CAUSES There is often not a definite cause or reason for these pains. These pains may be caused by a type of germ (virus). The discomfort may also come from overuse. Overuse includes working out too hard when your body is not fit. Boney aches also come from weather changes. Bone is sensitive to atmospheric pressure changes. HOME CARE INSTRUCTIONS   Ask when your test results will be ready. Make sure you get your test results.  Only take over-the-counter  or prescription medicines for pain, discomfort, or fever as directed by your caregiver. If you were given medications for your condition, do not drive, operate machinery or power tools, or sign legal documents for 24 hours. Do not drink alcohol. Do not take sleeping pills or other medications that may interfere with treatment.  Continue all activities unless the activities cause more pain. When the pain lessens, slowly resume normal activities. Gradually increase the intensity and duration of the activities or exercise.  During periods of severe pain, bed rest may be helpful. Lay or sit in any position that is comfortable.  Putting ice on the injured area.  Put ice in a bag.  Place a towel between your skin and the bag.  Leave the ice on for 15 to 20 minutes, 3 to 4 times a day.  Follow up with your caregiver for continued problems and no reason can be found for the pain. If the pain becomes worse or does not go away, it may be necessary to repeat tests or do  additional testing. Your caregiver may need to look further for a possible cause. SEEK IMMEDIATE MEDICAL CARE IF:  You have pain that is getting worse and is not relieved by medications.  You develop chest pain that is associated with shortness or breath, sweating, feeling sick to your stomach (nauseous), or throw up (vomit).  Your pain becomes localized to the abdomen.  You develop any new symptoms that seem different or that concern you. MAKE SURE YOU:   Understand these instructions.  Will watch your condition.  Will get help right away if you are not doing well or get worse. Document Released: 10/19/2005 Document Revised: 01/11/2012 Document Reviewed: 06/23/2013 [facility] Patient Information 2015 [place], [place]. This information is not intended to replace advice given to you by your health care provider. Make sure you discuss any questions you have with your health care provider.

## 2015-02-04 NOTE — ED Notes (Signed)
edpa at bedside. 

## 2015-03-08 ENCOUNTER — Ambulatory Visit: Payer: Medicare Other

## 2015-03-22 ENCOUNTER — Ambulatory Visit: Payer: 59 | Attending: Internal Medicine | Admitting: Physical Therapy

## 2015-03-22 ENCOUNTER — Encounter: Payer: Self-pay | Admitting: Physical Therapy

## 2015-03-22 DIAGNOSIS — G8929 Other chronic pain: Secondary | ICD-10-CM | POA: Insufficient documentation

## 2015-03-22 DIAGNOSIS — M25611 Stiffness of right shoulder, not elsewhere classified: Secondary | ICD-10-CM | POA: Diagnosis not present

## 2015-03-22 DIAGNOSIS — M25511 Pain in right shoulder: Secondary | ICD-10-CM | POA: Insufficient documentation
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
## 2015-03-22 NOTE — Patient Instructions (Signed)
SHOULDER: External Rotation - Supine (Cane)   Hold cane with both hands. Rotate arm away from body. Keep elbow on floor and next to body. _3-5__ reps per set, __3_ sets per day, __7_ days per week Add towel to keep elbow at side.  Copyright  VHI. All rights reserved.  Cane Horizontal - Supine   With straight arms holding cane above shoulders, bring cane out to right, center, out to left, and back to above head. Repeat _3__ times. Do __3_ times per day.  Copyright  VHI. All rights reserved.  Cane Exercise: Flexion   Lie on back, holding cane above chest. Keeping arms as straight as possible, lower cane toward floor beyond head. Hold _20___ seconds. Repeat __3__ times. Do __3__ sessions per day.  http://gt2.exer.us/91   Copyright  VHI. All rights reserved.

## 2015-04-09 ENCOUNTER — Ambulatory Visit: Payer: 59 | Attending: Internal Medicine | Admitting: Physical Therapy

## 2015-04-09 DIAGNOSIS — M25511 Pain in right shoulder: Secondary | ICD-10-CM | POA: Insufficient documentation

## 2015-04-09 DIAGNOSIS — M25611 Stiffness of right shoulder, not elsewhere classified: Secondary | ICD-10-CM

## 2015-04-09 DIAGNOSIS — G8929 Other chronic pain: Secondary | ICD-10-CM | POA: Diagnosis present

## 2015-04-09 NOTE — Patient Instructions (Signed)
Trigger Point Dry Needling  . What is Trigger Point Dry Needling (DN)? o DN is a physical therapy technique used to treat muscle pain and dysfunction. Specifically, DN helps deactivate muscle trigger points (muscle knots).  o A thin filiform needle is used to penetrate the skin and stimulate the underlying trigger point. The goal is for a local twitch response (LTR) to occur and for the trigger point to relax. No medication of any kind is injected during the procedure.   . What Does Trigger Point Dry Needling Feel Like?  o The procedure feels different for each individual patient. Some patients report that they do not actually feel the needle enter the skin and overall the process is not painful. Very mild bleeding may occur. However, many patients feel a deep cramping in the muscle in which the needle was inserted. This is the local twitch response.   Marland Kitchen. How Will I feel after the treatment? o Soreness is normal, and the onset of soreness may not occur for a few hours. Typically this soreness does not last longer than two days.  o Bruising is uncommon, however; ice can be used to decrease any possible bruising.  o In rare cases feeling tired or nauseous after the treatment is normal. In addition, your symptoms may get worse before they get better, this period will typically not last longer than 24 hours.   . What Can I do After My Treatment? o Increase your hydration by drinking more water for the next 24 hours. o You may place ice or heat on the areas treated that have become sore, however, do not use heat on inflamed or bruised areas. Heat often brings more relief post needling. o You can continue your regular activities, but vigorous activity is not recommended initially after the treatment for 24 hours. o DN is best combined with other physical therapy such as strengthening, stretching, and other therapies.   Levator Stretch   Grasp seat or sit on hand on side to be stretched. Turn head  toward other side and look down. Use hand on head to gently stretch neck in that position. Hold __30__ seconds. Repeat on other side. Repeat _2-3___ times. Do _2-3___ sessions per day.  http://gt2.exer.us/30   Copyright  VHI. All rights reserved.  Side-Bending   One hand on opposite side of head, pull head to side as far as is comfortable. Stop if there is pain. Hold __30__ seconds. Repeat with other hand to other side. Repeat _2-3___ times. Do 2-3____ sessions per day.   Copyright  VHI. All rights reserved.  Scapular Retraction (Standing)    Anthony Rowe, PT 04/09/2015 10:41 AM Phone: [phone] Fax: [phone]

## 2015-04-09 NOTE — Therapy (Signed)
[facility] [address] [place], [place], [address] Phone: [phone]   Fax:  [phone]  Physical Therapy Treatment  Patient Details  Name: Anthony Rowe MRN: [id number] Date of Birth: 10/08/1981 Referring Provider:  Quentin AngstJegede, Olugbemiga E, MD  Encounter Date: 04/09/2015      PT End of Session - 04/09/15 1103    Visit Number 2   Number of Visits 16   Date for PT Re-Evaluation 05/17/15   Authorization Type Medicare G code   PT Start Time 1030   PT Stop Time 1116   PT Time Calculation (min) 46 min   Activity Tolerance Patient tolerated treatment well   Behavior During Therapy [facility] for tasks assessed/performed      Past Medical History  Diagnosis Date  . Endometriosis   . Hypertension   . Migraine   . Asthma   . Bronchitis     Past Surgical History  Procedure Laterality Date  . Ankle surgery      There were no vitals filed for this visit.  Visit Diagnosis:  Chronic right shoulder pain  Shoulder stiffness, right      Subjective Assessment - 04/09/15 1033    Subjective Right shoulder stiff,    Pertinent History right hand dominant   Limitations Lifting            [facility] PT Assessment - 04/09/15 0001    AROM   Right Shoulder Flexion 142 Degrees   Right Shoulder ABduction 160 Degrees   Cervical Flexion 40   Cervical Extension 55   Cervical - Right Side Bend 55   Cervical - Left Side Bend 50   Cervical - Right Rotation 40   Cervical - Left Rotation 50                     [facility] Adult PT Treatment/Exercise - 04/09/15 1047    Shoulder Exercises: Supine   Other Supine Exercises Supine Cane press up, pullover, ER, Horiz Abdct x5 each with end range stretchreview   Moist Heat Therapy   Number Minutes Moist Heat 15 Minutes   Moist Heat Location --  neck and Right shoulder supine   Manual Therapy   Manual Therapy Myofascial release   Myofascial Release Right upper trap and levator   Neck  Exercises: Stretches   Upper Trapezius Stretch 2 reps;30 seconds   Upper Trapezius Stretch Limitations VC for correct execution   Levator Stretch 2 reps;30 seconds   Levator Stretch Limitations verbal cues for correct execution          Trigger Point Dry Needling - 04/09/15 1048    Consent Given? Yes   Education Handout Provided Yes   Muscles Treated Upper Body Upper trapezius;Levator scapulae   Upper Trapezius Response Twitch reponse elicited;Palpable increased muscle length  right and hyper reactive with immediate relaxation   Levator Scapulae Response Twitch response elicited;Palpable increased muscle length      right side only         PT Education - 04/09/15 1044    Education provided Yes   Education Details Trigger Point dry needling precautians and aftercaree levator and upper trap stretch   Person(s) Educated Patient   Methods Handout;Verbal cues;Tactile cues;Demonstration;Explanation   Comprehension Verbalized understanding;Returned demonstration          PT Short Term Goals - 04/09/15 1037    PT SHORT TERM GOAL #1   Title Patient will be independent in basic self care including use of ice/heat,  postural correction with shoulder ROM   Time 4   Period Weeks   Status On-going   PT SHORT TERM GOAL #2   Title Right shoulder    Time 4   Period Weeks   Status On-going   PT SHORT TERM GOAL #3   Title 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   Time 4   Period Weeks   Status On-going           PT Long Term Goals - 04/09/15 1038    PT LONG TERM GOAL #1   Title independent with HEP safe self progression   Time 8   Period Weeks   Status On-going   PT LONG TERM GOAL #2   Title Right shoulder flexion improved to 160 degrees needed for reaching overhead shelves   Time 8   Status New   PT LONG TERM GOAL #3   Title Patient will have grossly 4+/5 strength needed to carry or lift a 5# object.   PT LONG TERM  GOAL #4   Title FOTO functional outcome score improved from 57% to 33% indicating improved function with less pain.   Time 8   Period Weeks   Status On-going               Plan - 04/09/15 1036    Clinical Impression Statement Pt presents in clinic about 15 minutes late. Pt reviewed Supine cane and had very sensiitve reaction to light palpation of Right upper trap with mod to sever know at levator/upper trap.  Pt consented to trigger point dry needling and recieved immmediate relaxation of knot and AROM of cervical ROM taken and increase in Right shoulder flexion to 142 degrees.  Pt will continue with plan   Pt will benefit from skilled therapeutic intervention in order to improve on the following deficits Pain;Impaired UE functional use;Decreased strength;Decreased range of motion   Rehab Potential Good   Clinical Impairments Affecting Rehab Potential sedentary lifestyle, ?compliance with HEP   PT Frequency 2x / week   PT Duration 8 weeks   PT Next Visit Plan ; scapular mobility; GH and scapular mobs, UE Ranger, modalities as needed; low level strengthening.  Assess dry needling benefit   PT Home Exercise Plan Upper trap and levator stretch   Consulted and Agree with Plan of Care Patient        Problem List Patient Active Problem List   Diagnosis Date Noted  . Left ankle pain 09/25/2013  . Hypovitaminosis D 09/25/2013   Garen Lah, PT 04/09/2015 11:10 AM Phone: [phone] Fax: [phone]  [facility] [address] [place], [place], [address] Phone: [phone]   Fax:  [phone]

## 2015-04-11 ENCOUNTER — Ambulatory Visit: Payer: 59 | Admitting: Physical Therapy

## 2015-04-11 DIAGNOSIS — M25511 Pain in right shoulder: Secondary | ICD-10-CM | POA: Diagnosis not present

## 2015-04-11 DIAGNOSIS — M25611 Stiffness of right shoulder, not elsewhere classified: Secondary | ICD-10-CM

## 2015-04-11 DIAGNOSIS — G8929 Other chronic pain: Secondary | ICD-10-CM

## 2015-04-11 NOTE — Therapy (Signed)
[place], [place], [address] Phone: [phone]   Fax:  [phone]  Physical Therapy Treatment  Patient Details  Name: Anthony Rowe MRN: [id number] Date of Birth: May 02, 1981 Referring Provider:  Tresa Garter, MD  Encounter Date: 04/11/2015      PT End of Session - 04/11/15 1045    Visit Number 3   Number of Visits 16   Date for PT Re-Evaluation 05/17/15   Authorization Type Medicare G code   PT Start Time 3875   PT Stop Time 1118   PT Time Calculation (min) 63 min   Activity Tolerance Patient tolerated treatment well      Past Medical History  Diagnosis Date  . Endometriosis   . Hypertension   . Migraine   . Asthma   . Bronchitis     Past Surgical History  Procedure Laterality Date  . Ankle surgery      There were no vitals filed for this visit.  Visit Diagnosis:  Chronic right shoulder pain  Shoulder stiffness, right      Subjective Assessment - 04/11/15 1012    Subjective Reports feeling better, not much pain; states minimally sore following needling.     Currently in Pain? Yes   Pain Score 4    Pain Location Shoulder   Pain Orientation Right   Pain Type Chronic pain   Pain Onset More than a month ago   Pain Frequency Constant   Aggravating Factors  reaching overhead; avoiding lifting   Pain Relieving Factors dry needling was painful but helpful            [facility] PT Assessment - 04/11/15 1019    ROM / Strength   AROM / PROM / Strength Strength   AROM   Right Shoulder Flexion 175 Degrees   Right Shoulder ABduction 170 Degrees   Right Shoulder Internal Rotation --  T8   Right Shoulder External Rotation --  C7   Strength   Right Shoulder Flexion 4+/5   Right Shoulder Extension 4+/5   Right Shoulder ABduction 4+/5   Right Shoulder Internal Rotation 4+/5                     [facility] Adult PT Treatment/Exercise - 04/11/15 1032    Shoulder  Exercises: Standing   External Rotation Strengthening;Right;15 reps;Theraband   Theraband Level (Shoulder External Rotation) Level 2 (Red)   Internal Rotation Strengthening;Right;15 reps;Theraband   Extension Strengthening;Right;15 reps;Theraband   Theraband Level (Shoulder Extension) Level 2 (Red)   Row Right;15 reps;Theraband   Theraband Level (Shoulder Row) Level 2 (Red)   Other Standing Exercises UE Ranger on floor,2 steps, wall mount L14, 18, 22 10 reps each   Other Standing Exercises NMR supraspinatus on UE Ranger 2x10   Moist Heat Therapy   Number Minutes Moist Heat 10 Minutes   Moist Heat Location Shoulder   Manual Therapy   Manual Therapy Joint mobilization;Soft tissue mobilization   Joint Mobilization GH distraction,inferior and mobs with movement grade 3 10x each   Soft tissue mobilization right upper trap/levator scap          Trigger Point Dry Needling - 04/11/15 1044    Consent Given? Yes   Muscles Treated Upper Body Upper trapezius;Levator scapulae   Upper Trapezius Response Twitch reponse elicited;Palpable increased muscle length   Levator Scapulae Response Twitch response elicited;Palpable increased muscle length     Right only.  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   Time 4   Period Weeks   Status Partially Met           PT Long Term  Goals - 04/11/15 1118    PT LONG TERM GOAL #1   Title independent with HEP safe self progression   Time 8   Period Weeks   Status On-going   PT LONG TERM GOAL #2   Title Right shoulder flexion improved to 160 degrees needed for reaching overhead shelves   Status Achieved   PT LONG TERM GOAL #3   Title Patient will have grossly 4+/5 strength needed to carry or lift a 5# object.   Time 8   Period Weeks   Status Partially Met   PT LONG TERM GOAL #4   Title FOTO functional outcome score improved from 57% to 33% indicating improved function with less pain.   Time 8   Period Weeks   Status On-going               Plan - 04/11/15 1046    Clinical Impression Statement Excellent improvement in AROM of shoulder in all planes particularly with overhead.  Verbal and tactile cueing to decrease upper trap compensatory shoulder hike.  Trigger points in upper traps and levator scap continue to be painful but decreased size and number compared to last visit.  Progressing well with treatment plan.   PT Next Visit Plan assess response to dry needling and new red band "rockwood" type exercises; continue UE ranger for overhead without compensation and NMR of supraspinatus;check % improved        Problem List Patient Active Problem List   Diagnosis Date Noted  . Left ankle pain 09/25/2013  . Hypovitaminosis D 09/25/2013    Anthony Rowe 04/11/2015, 11:23 AM  [facility] [address] [place], [place], [address] Phone: [phone]   Fax:  [phone]    Ruben Im, PT 04/11/2015 11:23 AM Phone: [phone] Fax: [phone]

## 2015-04-11 NOTE — Patient Instructions (Addendum)
  Strengthening: Resisted Internal Rotation   Hold tubing in left hand, elbow at side and forearm out. Rotate forearm in across body. Repeat ___15_ times per set. Do ___1-2_ sets per session. Do __1__ sessions per day.  http://orth.exer.us/830   Copyright  VHI. All rights reserved.  Strengthening: Resisted External Rotation   Hold tubing in right hand, elbow at side and forearm across body. Rotate forearm out. Repeat _15___ times per set. Do _1-2___ sets per session. Do __1__ sessions per day.  http://orth.exer.us/828   Strengthening: Resisted Extension   Hold tubing in right hand, arm forward. Pull arm back, elbow straight. Repeat __15__ times per set. Do __1-2__ sets per session. Do __1__ sessions per day.  http://orth.exer.us/832   Copyright  VHI. All rights reserved.

## 2015-04-16 ENCOUNTER — Ambulatory Visit: Payer: 59 | Admitting: Physical Therapy

## 2015-04-18 ENCOUNTER — Ambulatory Visit: Payer: 59 | Admitting: Physical Therapy

## 2015-04-18 DIAGNOSIS — M25611 Stiffness of right shoulder, not elsewhere classified: Secondary | ICD-10-CM

## 2015-04-18 DIAGNOSIS — M25511 Pain in right shoulder: Secondary | ICD-10-CM | POA: Diagnosis not present

## 2015-04-18 DIAGNOSIS — G8929 Other chronic pain: Secondary | ICD-10-CM

## 2015-04-18 NOTE — Therapy (Signed)
[place], [place], [address] Phone: [phone]   Fax:  [phone]  Physical Therapy Treatment  Patient Details  Name: Anthony Rowe MRN: [id number] Date of Birth: 05-28-1981 Referring Provider:  Tresa Garter, MD  Encounter Date: 04/18/2015      PT End of Session - 04/18/15 1148    Visit Number 4   Number of Visits 16   Date for PT Re-Evaluation 05/17/15   Authorization Type Medicare G code   PT Start Time 4765   PT Stop Time 1100   PT Time Calculation (min) 45 min   Activity Tolerance Patient tolerated treatment well      Past Medical History  Diagnosis Date  . Endometriosis   . Hypertension   . Migraine   . Asthma   . Bronchitis     Past Surgical History  Procedure Laterality Date  . Ankle surgery      There were no vitals filed for this visit.  Visit Diagnosis:  Chronic right shoulder pain  Shoulder stiffness, right      Subjective Assessment - 04/18/15 1017    Subjective It's doing good but doing ice/heat at night. Soreness top of right shoulder.  Looking into a another job with less lifting.  Going to [place] on 6/27.   Pertinent History right hand dominant   Currently in Pain? No/denies   Pain Score 0-No pain   Pain Onset More than a month ago                         [facility] Adult PT Treatment/Exercise - 04/18/15 0001    Exercises   Exercises Shoulder   Shoulder Exercises: Prone   Extension Right;Strengthening;Weights   Extension Weight (lbs) 3   Horizontal ABduction 1 Strengthening;Right;15 reps   Horizontal ABduction 2 Weight (lbs) Y formation scaption 3# 20x   Shoulder Exercises: Sidelying   External Rotation Strengthening;Right;15 reps;Weights   External Rotation Weight (lbs) 3   Shoulder Exercises: Standing   Protraction --  Body Blade 3 positions 45 sec x2 each   External Rotation Strengthening;Right;15 reps;Theraband   Theraband Level  (Shoulder External Rotation) Level 3 (Green)   Internal Rotation Strengthening;Right;15 reps;Theraband   Theraband Level (Shoulder Internal Rotation) Level 3 (Green)   Extension Strengthening;Right;20 reps;Theraband   Theraband Level (Shoulder Extension) Level 3 (Green)   Row Right;Theraband;15 reps   Theraband Level (Shoulder Row) Level 3 (Green)   Other Standing Exercises UE Ranger on floor,2 steps, wall mount  18, 22, 26 10 reps each   Other Standing Exercises NMR supraspinatus on UE Ranger 2x10   Shoulder Exercises: ROM/Strengthening   Other ROM/Strengthening Exercises Standing lat pull down 30# 20x                  PT Short Term Goals - 04/18/15 1151    PT SHORT TERM GOAL #1   Title Patient will be independent in basic self care including use of ice/heat, postural correction with shoulder ROM   Status Achieved   PT SHORT TERM GOAL #3   Title 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   Status Achieved           PT Long Term Goals - 04/18/15 1151    PT LONG TERM GOAL #1   Title independent with HEP safe self progression   Time 8   Period Weeks  Status On-going   PT LONG TERM GOAL #2   Title Right shoulder flexion improved to 160 degrees needed for reaching overhead shelves   Status Achieved   PT LONG TERM GOAL #3   Title Patient will have grossly 4+/5 strength needed to carry or lift a 5# object.   Time 8   Period Weeks   Status Partially Met   PT LONG TERM GOAL #4   Title FOTO functional outcome score improved from 57% to 33% indicating improved function with less pain.   Time 8   Period Weeks   Status On-going               Plan - 04/18/15 1148    Clinical Impression Statement Patient reports no pain during or following shoulder ROM and strengthening exercises.  Verbal and tactile cues to decrease compensatory upper trap hike.  Progressing well with short and long term goals.     PT Next Visit  Plan Continue with strengthening especially peri scap muscles;  dry needling and manual as needed.  Patient going to [place] soon.          Problem List Patient Active Problem List   Diagnosis Date Noted  . Left ankle pain 09/25/2013  . Hypovitaminosis D 09/25/2013    [name] 04/18/2015, 11:52 AM  [facility] [address] [place], [place], [address] Phone: [phone]   Fax:  [phone]  Ruben Im, PT 04/18/2015 11:52 AM Phone: [phone] Fax: [phone]

## 2015-04-23 ENCOUNTER — Ambulatory Visit: Payer: 59 | Admitting: Physical Therapy

## 2015-04-25 ENCOUNTER — Ambulatory Visit: Payer: 59 | Admitting: Physical Therapy

## 2015-04-30 ENCOUNTER — Encounter: Payer: Medicare Other | Admitting: Physical Therapy

## 2015-05-02 ENCOUNTER — Encounter: Payer: Medicare Other | Admitting: Physical Therapy

## 2015-05-07 ENCOUNTER — Ambulatory Visit: Payer: 59 | Attending: Internal Medicine | Admitting: Physical Therapy

## 2015-05-07 DIAGNOSIS — G8929 Other chronic pain: Secondary | ICD-10-CM | POA: Insufficient documentation

## 2015-05-07 DIAGNOSIS — M25511 Pain in right shoulder: Secondary | ICD-10-CM | POA: Insufficient documentation

## 2015-05-07 DIAGNOSIS — M25611 Stiffness of right shoulder, not elsewhere classified: Secondary | ICD-10-CM | POA: Insufficient documentation

## 2015-05-09 ENCOUNTER — Ambulatory Visit: Payer: 59 | Admitting: Physical Therapy

## 2015-05-09 DIAGNOSIS — M25511 Pain in right shoulder: Principal | ICD-10-CM

## 2015-05-09 DIAGNOSIS — G8929 Other chronic pain: Secondary | ICD-10-CM

## 2015-05-09 DIAGNOSIS — M25611 Stiffness of right shoulder, not elsewhere classified: Secondary | ICD-10-CM | POA: Diagnosis present

## 2015-05-09 NOTE — Therapy (Addendum)
[place], [place], [address] Phone: [phone]   Fax:  [phone]  Physical Therapy Treatment/Discharge Summary  Patient Details  Name: Anthony Rowe MRN: [id number] Date of Birth: 12/28/1980 Referring Provider:  Tresa Garter, MD  Encounter Date: 05/09/2015      PT End of Session - 05/09/15 1047    Visit Number 5   Number of Visits 16   Date for PT Re-Evaluation 05/17/15   Authorization Type Medicare G code   PT Start Time 1016   PT Stop Time 1100   PT Time Calculation (min) 44 min   Activity Tolerance Patient tolerated treatment well      Past Medical History  Diagnosis Date  . Endometriosis   . Hypertension   . Migraine   . Asthma   . Bronchitis     Past Surgical History  Procedure Laterality Date  . Ankle surgery      There were no vitals filed for this visit.  Visit Diagnosis:  Chronic right shoulder pain  Shoulder stiffness, right      Subjective Assessment - 05/09/15 1018    Subjective Tired from tired to [place].  Shoulder hurt on/off but none presently.     Currently in Pain? No/denies   Pain Location Shoulder   Pain Orientation Right   Pain Type Chronic pain   Pain Onset More than a month ago   Pain Frequency Intermittent   Aggravating Factors  nothing identified                         [facility] Adult PT Treatment/Exercise - 05/09/15 0001    Exercises   Exercises Shoulder   Shoulder Exercises: Seated   Other Seated Exercises UBE FW and BW 5 min   Shoulder Exercises: Prone   Retraction Strengthening;Right;15 reps;Weights   Retraction Weight (lbs) 4   Extension Strengthening;Right;15 reps;Weights   Extension Weight (lbs) 4   Horizontal ABduction 1 Strengthening;Right;15 reps   Horizontal ABduction 1 Weight (lbs) 2   Horizontal ABduction 2 Strengthening;Right;15 reps;Weights   Horizontal ABduction 2 Weight (lbs) 2   Shoulder Exercises:  Sidelying   External Rotation Strengthening;Right;15 reps;Weights   External Rotation Weight (lbs) 4   Shoulder Exercises: Standing   External Rotation Strengthening;Right;15 reps;Theraband   Theraband Level (Shoulder External Rotation) Level 3 (Green)   Internal Rotation Strengthening;Right;15 reps;Theraband   Theraband Level (Shoulder Internal Rotation) Level 3 (Green)   Other Standing Exercises UE Ranger L20 20x and with IR/scaption   Other Standing Exercises Green band diagonal extensions 15x each                  PT Short Term Goals - 05/09/15 1052    PT SHORT TERM GOAL #1   Title Patient will be independent in basic self care including use of ice/heat, postural correction with shoulder ROM   Status Achieved   PT SHORT TERM GOAL #3   Title 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   Status Achieved           PT Long Term Goals - 05/09/15 1053    PT LONG TERM GOAL #1   Title independent with HEP safe self progression   Time 8   Period Weeks   Status On-going   PT LONG TERM GOAL #2   Title Right shoulder flexion improved to 160 degrees needed for reaching  overhead shelves   Status Achieved   PT LONG TERM GOAL #3   Title Patient will have grossly 4+/5 strength needed to carry or lift a 5# object.   Time 8   Period Weeks   Status Partially Met   PT LONG TERM GOAL #4   Title FOTO functional outcome score improved from 57% to 33% indicating improved function with less pain.   Time 8   Period Weeks   Status On-going               Plan - 05/09/15 1048    Clinical Impression Statement Patient denies pain with ther exercises.  Min verbal and tactile cues for scapular setting with exercises.  Assess progress toward goals next visit.  May be ready for discharge.   PT Next Visit Plan Recheck ROM, MMT, FOTO, progress toward toward goals, recert vs. discharge next visit       PHYSICAL THERAPY DISCHARGE  SUMMARY  Visits from Start of Care: 5  Current functional level related to goals / functional outcomes: The patient did not show for last scheduled visit.  He has not called to reschedule and chart has been inactive for several months.     Remaining deficits: As above   Education / Equipment: HEP Plan:                                                    Patient goals were partially met. Patient is being discharged due to not returning since the last visit.  ?????        Problem List Patient Active Problem List   Diagnosis Date Noted  . Left ankle pain 09/25/2013  . Hypovitaminosis D 09/25/2013    [name] 05/09/2015, 10:57 AM  [facility] [address] [place], [place], [address] Phone: [phone]   Fax:  [phone]

## 2015-05-13 ENCOUNTER — Ambulatory Visit: Payer: 59 | Admitting: Physical Therapy

## 2015-05-19 ENCOUNTER — Emergency Department (HOSPITAL_COMMUNITY)
Admission: EM | Admit: 2015-05-19 | Discharge: 2015-05-19 | Disposition: A | Discharge status: Home / Self Care | Payer: 59 | Attending: Emergency Medicine | Admitting: Emergency Medicine

## 2015-05-19 ENCOUNTER — Encounter (HOSPITAL_COMMUNITY): Payer: Self-pay

## 2015-05-19 ENCOUNTER — Emergency Department (HOSPITAL_COMMUNITY): Payer: 59

## 2015-05-19 DIAGNOSIS — Y9389 Activity, other specified: Secondary | ICD-10-CM | POA: Insufficient documentation

## 2015-05-19 DIAGNOSIS — Y9289 Other specified places as the place of occurrence of the external cause: Secondary | ICD-10-CM | POA: Diagnosis not present

## 2015-05-19 DIAGNOSIS — Z87438 Personal history of other diseases of male genital organs: Secondary | ICD-10-CM | POA: Insufficient documentation

## 2015-05-19 DIAGNOSIS — W230XXA Caught, crushed, jammed, or pinched between moving objects, initial encounter: Secondary | ICD-10-CM | POA: Diagnosis not present

## 2015-05-19 DIAGNOSIS — Z79899 Other long term (current) drug therapy: Secondary | ICD-10-CM | POA: Diagnosis not present

## 2015-05-19 DIAGNOSIS — I10 Essential (primary) hypertension: Secondary | ICD-10-CM | POA: Insufficient documentation

## 2015-05-19 DIAGNOSIS — S8991XA Unspecified injury of right lower leg, initial encounter: Secondary | ICD-10-CM | POA: Diagnosis not present

## 2015-05-19 DIAGNOSIS — J45909 Unspecified asthma, uncomplicated: Secondary | ICD-10-CM | POA: Diagnosis not present

## 2015-05-19 DIAGNOSIS — G43909 Migraine, unspecified, not intractable, without status migrainosus: Secondary | ICD-10-CM | POA: Diagnosis not present

## 2015-05-19 DIAGNOSIS — Y998 Other external cause status: Secondary | ICD-10-CM | POA: Diagnosis not present

## 2015-05-19 DIAGNOSIS — M25561 Pain in right knee: Secondary | ICD-10-CM

## 2015-05-19 MED ORDER — NAPROXEN 500 MG PO TABS: 500.0000 mg | ORAL_TABLET | Freq: Two times a day (BID) | ORAL | Status: DC

## 2015-05-19 MED ORDER — KETOROLAC TROMETHAMINE 30 MG/ML IJ SOLN
30.0000 mg | Freq: Once | INTRAMUSCULAR | Status: AC
  Administered 2015-05-19: 30 mg via INTRAMUSCULAR
  Filled 2015-05-19: qty 1

## 2015-05-19 NOTE — Discharge Instructions (Signed)
Please follow up with your primary care physician in 1-2 days. If you do not have one please call the [facility] and wellness Center number listed above. Please read all discharge instructions and return precautions.   Knee Pain The knee is the complex joint between your thigh and your lower leg. It is made up of bones, tendons, ligaments, and cartilage. The bones that make up the knee are:  The femur in the thigh.  The tibia and fibula in the lower leg.  The patella or kneecap riding in the groove on the lower femur. CAUSES  Knee pain is a common complaint with many causes. A few of these causes are:  Injury, such as:  A ruptured ligament or tendon injury.  Torn cartilage.  Medical conditions, such as:  Gout  Arthritis  Infections  Overuse, over training, or overdoing a physical activity. Knee pain can be minor or severe. Knee pain can accompany debilitating injury. Minor knee problems often respond well to self-care measures or get well on their own. More serious injuries may need medical intervention or even surgery. SYMPTOMS The knee is complex. Symptoms of knee problems can vary widely. Some of the problems are:  Pain with movement and weight bearing.  Swelling and tenderness.  Buckling of the knee.  Inability to straighten or extend your knee.  Your knee locks and you cannot straighten it.  Warmth and redness with pain and fever.  Deformity or dislocation of the kneecap. DIAGNOSIS  Determining what is wrong may be very straight forward such as when there is an injury. It can also be challenging because of the complexity of the knee. Tests to make a diagnosis may include:  Your caregiver taking a history and doing a physical exam.  Routine X-rays can be used to rule out other problems. X-rays will not reveal a cartilage tear. Some injuries of the knee can be diagnosed by:  Arthroscopy a surgical technique by which a small video camera is inserted through tiny  incisions on the sides of the knee. This procedure is used to examine and repair internal knee joint problems. Tiny instruments can be used during arthroscopy to repair the torn knee cartilage (meniscus).  Arthrography is a radiology technique. A contrast liquid is directly injected into the knee joint. Internal structures of the knee joint then become visible on X-ray film.  An MRI scan is a non X-ray radiology procedure in which magnetic fields and a computer produce two- or three-dimensional images of the inside of the knee. Cartilage tears are often visible using an MRI scanner. MRI scans have largely replaced arthrography in diagnosing cartilage tears of the knee.  Blood work.  Examination of the fluid that helps to lubricate the knee joint (synovial fluid). This is done by taking a sample out using a needle and a syringe. TREATMENT The treatment of knee problems depends on the cause. Some of these treatments are:  Depending on the injury, proper casting, splinting, surgery, or physical therapy care will be needed.  Give yourself adequate recovery time. Do not overuse your joints. If you begin to get sore during workout routines, back off. Slow down or do fewer repetitions.  For repetitive activities such as cycling or running, maintain your strength and nutrition.  Alternate muscle groups. For example, if you are a weight lifter, work the upper body on one day and the lower body the next.  Either tight or weak muscles do not give the proper support for your knee. Tight  or weak muscles do not absorb the stress placed on the knee joint. Keep the muscles surrounding the knee strong.  Take care of mechanical problems.  If you have flat feet, orthotics or special shoes may help. See your caregiver if you need help.  Arch supports, sometimes with wedges on the inner or outer aspect of the heel, can help. These can shift pressure away from the side of the knee most bothered by  osteoarthritis.  A brace called an "unloader" brace also may be used to help ease the pressure on the most arthritic side of the knee.  If your caregiver has prescribed crutches, braces, wraps or ice, use as directed. The acronym for this is PRICE. This means protection, rest, ice, compression, and elevation.  Nonsteroidal anti-inflammatory drugs (NSAIDs), can help relieve pain. But if taken immediately after an injury, they may actually increase swelling. Take NSAIDs with food in your stomach. Stop them if you develop stomach problems. Do not take these if you have a history of ulcers, stomach pain, or bleeding from the bowel. Do not take without your caregiver's approval if you have problems with fluid retention, heart failure, or kidney problems.  For ongoing knee problems, physical therapy may be helpful.  Glucosamine and chondroitin are over-the-counter dietary supplements. Both may help relieve the pain of osteoarthritis in the knee. These medicines are different from the usual anti-inflammatory drugs. Glucosamine may decrease the rate of cartilage destruction.  Injections of a corticosteroid drug into your knee joint may help reduce the symptoms of an arthritis flare-up. They may provide pain relief that lasts a few months. You may have to wait a few months between injections. The injections do have a small increased risk of infection, water retention, and elevated blood sugar levels.  Hyaluronic acid injected into damaged joints may ease pain and provide lubrication. These injections may work by reducing inflammation. A series of shots may give relief for as long as 6 months.  Topical painkillers. Applying certain ointments to your skin may help relieve the pain and stiffness of osteoarthritis. Ask your pharmacist for suggestions. Many over the-counter products are approved for temporary relief of arthritis pain.  In some countries, doctors often prescribe topical NSAIDs for relief of  chronic conditions such as arthritis and tendinitis. A review of treatment with NSAID creams found that they worked as well as oral medications but without the serious side effects. PREVENTION  Maintain a healthy weight. Extra pounds put more strain on your joints.  Get strong, stay limber. Weak muscles are a common cause of knee injuries. Stretching is important. Include flexibility exercises in your workouts.  Be smart about exercise. If you have osteoarthritis, chronic knee pain or recurring injuries, you may need to change the way you exercise. This does not mean you have to stop being active. If your knees ache after jogging or playing basketball, consider switching to swimming, water aerobics, or other low-impact activities, at least for a few days a week. Sometimes limiting high-impact activities will provide relief.  Make sure your shoes fit well. Choose footwear that is right for your sport.  Protect your knees. Use the proper gear for knee-sensitive activities. Use kneepads when playing volleyball or laying carpet. Buckle your seat belt every time you drive. Most shattered kneecaps occur in car accidents.  Rest when you are tired. SEEK MEDICAL CARE IF:  You have knee pain that is continual and does not seem to be getting better.  SEEK IMMEDIATE MEDICAL CARE IF:  Your knee joint feels hot to the touch and you have a high fever. MAKE SURE YOU:   Understand these instructions.  Will watch your condition.  Will get help right away if you are not doing well or get worse. Document Released: 08/16/2007 Document Revised: 01/11/2012 Document Reviewed: 08/16/2007 [facility] Patient Information 2015 [place], [place]. This information is not intended to replace advice given to you by your health care provider. Make sure you discuss any questions you have with your health care provider. Elastic Bandage and RICE Elastic bandages come in different shapes and sizes. They perform different  functions. Your caregiver will help you to decide what is best for your protection, recovery, or rehabilitation following an injury. The following are some general tips to help you use an elastic bandage.  Use the bandage as directed by the maker of the bandage you are using.  Do not wrap it too tight. This may cut off the circulation of the arm or leg below the bandage.  If part of your body beyond the bandage becomes blue, numb, or swollen, it is too tight. Loosen the bandage as needed to prevent these problems.  See your caregiver or trainer if the bandage seems to be making your problems worse rather than better. Bandages may be a reminder to you that you have an injury. However, they provide very little support. The few pounds of support they provide are minor considering the pressure it takes to injure a joint or tear ligaments. Therefore, the joint will not be able to handle all of the wear and tear it could before the injury. The routine care of many injuries includes Rest, Ice, Compression, and Elevation (RICE).  Rest is required to allow your body to heal. Generally, routine activities can be resumed when comfortable. Injured tendons and bones take about 6 weeks to heal.  Icing the injury helps keep the swelling down and reduces pain. Do not apply ice directly to the skin. Put ice in a plastic bag. Place a towel between the skin and the bag. This will prevent frostbite to the skin. Apply ice bags to the injured area for 15-20 minutes, every 2 hours while awake. Do this for the first 24 to 48 hours, then as directed by your caregiver.  Compression helps keep swelling down, gives support, and helps with discomfort. If an elastic bandage has been applied today, it should be removed and reapplied every 3 to 4 hours. It should not be applied tightly, but firmly enough to keep swelling down. Watch fingers or toes for swelling, bluish discoloration, coldness, numbness, or increased pain. If any of  these problems occur, remove the bandage and reapply it more loosely. If these problems persist, contact your caregiver.  Elevation helps reduce swelling and decreases pain. The injured area (arms, hands, legs, or feet) should be placed near to or above the heart (center of the chest) if able. Persistent pain and inability to use the injured area for more than 2 to 3 days are warning signs. You should see a caregiver for a follow-up visit as soon as possible. Initially, a minor broken bone (hairline fracture) may not be seen on X-rays. It may take 7 to 10 days to finally show up. Continued pain and swelling show that further evaluation and/or X-rays are needed. Make a follow-up visit with your caregiver. A specialist in reading X-rays (radiologist) will read your X-rays again. Finding out the results of your test Not all test results are available during your  visit. If your test results are not back during the visit, make an appointment with your caregiver to find out the results. Do not assume everything is normal if you have not heard from your caregiver or the medical facility. It is important for you to follow up on all of your test results. Document Released: 04/10/2002 Document Revised: 01/11/2012 Document Reviewed: 02/20/2008 [facility] Patient Information 2015 [place], [place]. This information is not intended to replace advice given to you by your health care provider. Make sure you discuss any questions you have with your health care provider.

## 2015-05-19 NOTE — ED Notes (Signed)
Pt presents with c/o right leg injury. Pt reports that he injured his body was pinned in between the door and the door was repeatedly closed on his leg while he was play fighting with a friend. Ambulatory to triage.
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
## 2015-06-22 ENCOUNTER — Encounter (HOSPITAL_COMMUNITY): Payer: Self-pay

## 2015-06-22 DIAGNOSIS — Z87828 Personal history of other (healed) physical injury and trauma: Secondary | ICD-10-CM | POA: Diagnosis not present

## 2015-06-22 DIAGNOSIS — Z791 Long term (current) use of non-steroidal anti-inflammatories (NSAID): Secondary | ICD-10-CM | POA: Diagnosis not present

## 2015-06-22 DIAGNOSIS — Z79891 Long term (current) use of opiate analgesic: Secondary | ICD-10-CM | POA: Diagnosis not present

## 2015-06-22 DIAGNOSIS — Z87438 Personal history of other diseases of male genital organs: Secondary | ICD-10-CM | POA: Diagnosis not present

## 2015-06-22 DIAGNOSIS — M79651 Pain in right thigh: Secondary | ICD-10-CM | POA: Insufficient documentation

## 2015-06-22 DIAGNOSIS — J45909 Unspecified asthma, uncomplicated: Secondary | ICD-10-CM | POA: Diagnosis not present

## 2015-06-22 DIAGNOSIS — Z79899 Other long term (current) drug therapy: Secondary | ICD-10-CM | POA: Diagnosis not present

## 2015-06-22 DIAGNOSIS — I10 Essential (primary) hypertension: Secondary | ICD-10-CM | POA: Diagnosis not present

## 2015-06-22 NOTE — ED Notes (Signed)
Pt has been having pain in his right thigh since July. States the pain is worse and not getting any better.

## 2015-06-23 ENCOUNTER — Emergency Department (HOSPITAL_COMMUNITY): Payer: 59

## 2015-06-23 ENCOUNTER — Emergency Department (HOSPITAL_COMMUNITY)
Admission: EM | Admit: 2015-06-23 | Discharge: 2015-06-23 | Disposition: A | Discharge status: Home / Self Care | Payer: 59 | Attending: Emergency Medicine | Admitting: Emergency Medicine

## 2015-06-23 DIAGNOSIS — M79651 Pain in right thigh: Secondary | ICD-10-CM

## 2015-06-23 DIAGNOSIS — R52 Pain, unspecified: Secondary | ICD-10-CM

## 2015-06-23 NOTE — ED Notes (Signed)
MD at bedside. 

## 2015-06-23 NOTE — ED Notes (Signed)
Patient transported to X-ray 
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
## 2015-06-23 NOTE — Discharge Instructions (Signed)
Quadriceps Contusion  Trampus, continue to use ice packs and ibuprofen for pain control.  See orthopaedic surgery within 3 days for close follow up.  If symptoms worsen, come back to the ED immediately.  Thank you.   A quadriceps contusion is a deep bruise of the large muscle in the front of your thigh. Contusions happen when an injury causes bleeding under the skin. Signs of bruising include pain, puffiness (swelling), and discolored skin. The contusion may turn blue, purple, or yellow. Follow your doctor's directions when this muscle is bruised. HOME CARE  Put ice on the injured area.  Put ice in a plastic bag.  Place a towel between your skin and the bag.  Leave the ice on for 15-20 minutes, 03-04 times a day.  Only take medicines as told by your doctor.  Rest your thigh until the pain and puffiness are better.  Raise (elevate) your leg to lessen puffiness. Lie down and put a pillow under your knee.  Put on an elastic wrap as told by your doctor. You can take it off for sleeping, showers, and baths. If your toes get cold, blue, or lose feeling (numb), take the wrap off and put it back on more loosely.  Walk or move around if it is not painful, or as told. Start normal activities only when your doctor says it is okay.  Keep all doctor visits as told. GET HELP IF:  You have more bruising or puffiness.  Your pain gets worse.  Your puffiness or pain is not helped by medicine.  Your toes or foot become cold or turn blue.  You see that your thigh is getting larger. MAKE SURE YOU:   Understand these instructions.  Will watch your condition.  Will get help right away if you are not doing well or get worse. Document Released: 10/08/2011 Document Revised: 01/11/2012 Document Reviewed: 10/08/2011 [facility] Patient Information 2015 [place], [place]. This information is not intended to replace advice given to you by your health care provider. Make sure you discuss any questions  you have with your health care provider. RICE: Routine Care for Injuries Rest, Ice, Compression, and Elevation (RICE) are often used to care for injuries. HOME CARE  Rest your injury.  Put ice on the injury.  Put ice in a plastic bag.  Place a towel between your skin and the bag.  Leave the ice on for 15-20 minutes, 03-04 times a day. Do this for as long as told by your doctor.  Apply pressure (compression) with an elastic bandage. Remove and reapply the bandage every 3 to 4 hours. Do not wrap the bandage too tight. Wrap the bandage looser if the fingers or toes are puffy (swollen), blue, cold, painful, or lose feeling (numb).  Raise (elevate) your injury. Raise your injury above the heart if you can. GET HELP RIGHT AWAY IF:  You have lasting pain or puffiness.  Your injury is red, weak, or loses feeling.  Your problems get worse, not better, after several days. MAKE SURE YOU:  Understand these instructions.  Will watch your condition.  Will get help right away if you are not doing well or get worse. Document Released: 04/06/2008 Document Revised: 01/11/2012 Document Reviewed: 03/20/2011 [facility] Patient Information 2015 [place], [place]. This information is not intended to replace advice given to you by your health care provider. Make sure you discuss any questions you have with your health care provider.

## 2015-08-06 ENCOUNTER — Emergency Department (HOSPITAL_COMMUNITY)
Admission: EM | Admit: 2015-08-06 | Discharge: 2015-08-06 | Disposition: A | Discharge status: Home / Self Care | Payer: BLUE CROSS/BLUE SHIELD | Attending: Emergency Medicine | Admitting: Emergency Medicine

## 2015-08-06 ENCOUNTER — Encounter (HOSPITAL_COMMUNITY): Payer: Self-pay | Admitting: Emergency Medicine

## 2015-08-06 DIAGNOSIS — I10 Essential (primary) hypertension: Secondary | ICD-10-CM | POA: Insufficient documentation

## 2015-08-06 DIAGNOSIS — G43909 Migraine, unspecified, not intractable, without status migrainosus: Secondary | ICD-10-CM | POA: Diagnosis not present

## 2015-08-06 DIAGNOSIS — E876 Hypokalemia: Secondary | ICD-10-CM | POA: Diagnosis not present

## 2015-08-06 DIAGNOSIS — J45909 Unspecified asthma, uncomplicated: Secondary | ICD-10-CM | POA: Diagnosis not present

## 2015-08-06 DIAGNOSIS — R079 Chest pain, unspecified: Secondary | ICD-10-CM | POA: Insufficient documentation

## 2015-08-06 DIAGNOSIS — R42 Dizziness and giddiness: Secondary | ICD-10-CM | POA: Diagnosis not present

## 2015-08-06 DIAGNOSIS — Z79899 Other long term (current) drug therapy: Secondary | ICD-10-CM | POA: Diagnosis not present

## 2015-08-06 LAB — I-STAT CHEM 8, ED
BUN: 6 mg/dL (ref 6–20)
CALCIUM ION: 1.21 mmol/L (ref 1.12–1.23)
CREATININE: 1.1 mg/dL (ref 0.61–1.24)
Chloride: 103 mmol/L (ref 101–111)
Glucose, Bld: 93 mg/dL (ref 65–99)
HCT: 49 % (ref 39.0–52.0)
Hemoglobin: 16.7 g/dL (ref 13.0–17.0)
Potassium: 2.9 mmol/L — ABNORMAL LOW (ref 3.5–5.1)
Sodium: 142 mmol/L (ref 135–145)
TCO2: 22 mmol/L (ref 0–100)

## 2015-08-06 LAB — D-DIMER, QUANTITATIVE (NOT AT ARMC)

## 2015-08-06 LAB — CBC
HCT: 44.5 % (ref 39.0–52.0)
HEMOGLOBIN: 15.6 g/dL (ref 13.0–17.0)
MCH: 29.7 pg (ref 26.0–34.0)
MCHC: 35.1 g/dL (ref 30.0–36.0)
MCV: 84.8 fL (ref 78.0–100.0)
PLATELETS: 279 10*3/uL (ref 150–400)
RBC: 5.25 MIL/uL (ref 4.22–5.81)
RDW: 13 % (ref 11.5–15.5)
WBC: 5.8 10*3/uL (ref 4.0–10.5)

## 2015-08-06 LAB — I-STAT TROPONIN, ED: TROPONIN I, POC: 0.01 ng/mL (ref 0.00–0.08)

## 2015-08-06 MED ORDER — SODIUM CHLORIDE 0.9 % IV BOLUS (SEPSIS)
1000.0000 mL | Freq: Once | INTRAVENOUS | Status: AC
  Administered 2015-08-06: 1000 mL via INTRAVENOUS

## 2015-08-06 MED ORDER — POTASSIUM CHLORIDE ER 20 MEQ PO TBCR: 20.0000 meq | EXTENDED_RELEASE_TABLET | Freq: Every day | ORAL | Status: DC

## 2015-08-06 MED ORDER — POTASSIUM CHLORIDE CRYS ER 20 MEQ PO TBCR
40.0000 meq | EXTENDED_RELEASE_TABLET | Freq: Once | ORAL | Status: AC
  Administered 2015-08-06: 40 meq via ORAL
  Filled 2015-08-06: qty 2

## 2015-08-06 MED ORDER — LISINOPRIL 20 MG PO TABS: 20.0000 mg | ORAL_TABLET | Freq: Every day | ORAL | Status: DC

## 2015-08-06 NOTE — ED Notes (Signed)
Pt reports noting his blood pressure was elevated last night at work. Pt reports dizziness, headache, not feeling well. Pt states usually takes lopressor, not very good compliance per patient report. Pt awake, alert, oriented x4, NAD at present.

## 2015-08-06 NOTE — ED Notes (Signed)
Pt stated upon standing for orthostatic vital signs he was extremely dizzy. PA made aware

## 2015-08-06 NOTE — Discharge Instructions (Signed)
Please read and follow all provided instructions.  Your diagnoses today include:  1. Lightheadedness   2. Essential hypertension     Tests performed today include:  An EKG of your heart  Cardiac enzymes - a blood test for heart muscle damage  Blood counts and electrolytes  Blood test for blood clot - negative  Vital signs. See below for your results today.   Medications prescribed:   None  Take any prescribed medications only as directed.  Follow-up instructions: Please follow-up with your primary care provider in 1 week for further evaluation of your symptoms.   Return instructions:  SEEK IMMEDIATE MEDICAL ATTENTION IF:  You have severe chest pain, especially if the pain is crushing or pressure-like and spreads to the arms, back, neck, or jaw, or if you have sweating, nausea (feeling sick to your stomach), or shortness of breath. THIS IS AN EMERGENCY. Don't wait to see if the pain will go away. Get medical help at once. Call 911 or [phone] (operator). DO NOT drive yourself to the hospital.   Your chest pain gets worse and does not go away with rest.   You have an attack of chest pain lasting longer than usual, despite rest and treatment with the medications your caregiver has prescribed.   You wake from sleep with chest pain or shortness of breath.  You feel dizzy or faint.  You have chest pain not typical of your usual pain for which you originally saw your caregiver.   You have any other emergent concerns regarding your health.  Your vital signs today were: BP 145/79 mmHg   Pulse 104   Temp(Src) 99.4 F (37.4 C) (Oral)   Resp 32   SpO2 100% If your blood pressure (BP) was elevated above 135/85 this visit, please have this repeated by your doctor within one month. --------------

## 2015-08-06 NOTE — ED Provider Notes (Signed)
CSN: [id number]     Arrival date & time 08/06/15  0755 History   First MD Initiated Contact with Patient 08/06/15 [phone]     Chief Complaint  Patient presents with  . Hypertension     (Consider location/radiation/quality/duration/timing/severity/associated sxs/prior Treatment) HPI Comments: Patient who is undergoing gender reassignment from male to male, testosterone use, hypertension with poor compliance -- presents with complaint of lightheadedness. Patient states that he was at work last night and acutely became very dizzy, described as a lightheadedness. No vertigo or spinning. He had associated headache, malaise. He checked his blood pressure several times and it was in the 160s. No focal chest pain or shortness of breath but states that he has chest tightness like he is having an asthma attack. No pain with deep breathing. He reports recent "stomach bug" with associated nausea, vomiting, and diarrhea. This started 5 days ago at the time of a long car ride to [place]. Patient returned from [place] 2 days ago. He states that he drove most of the way and only stopped for gas. He denies pain or swelling in the lower extremities. No history of blood clots. No treatments prior to arrival. Course is constant. No exacerbation of symptoms with activity but more lightheaded with standing. Patient denies signs of stroke including: facial droop, slurred speech, aphasia, weakness/numbness in extremities, imbalance/trouble walking.    Patient is a 34 y.o. male presenting with hypertension. The history is provided by the patient.  Hypertension Associated symptoms include fatigue, headaches and nausea. Pertinent negatives include no abdominal pain, chest pain, chills, coughing, fever, myalgias, rash, sore throat, vomiting or weakness.    Past Medical History  Diagnosis Date  . Endometriosis   . Hypertension   . Migraine   . Asthma   . Bronchitis    Past Surgical History  Procedure  Laterality Date  . Ankle surgery     History reviewed. No pertinent family history. Social History  Substance Use Topics  . Smoking status: Never Smoker   . Smokeless tobacco: None  . Alcohol Use: No    Review of Systems  Constitutional: Positive for fatigue. Negative for fever and chills.  HENT: Negative for rhinorrhea and sore throat.   Eyes: Negative for redness.  Respiratory: Positive for chest tightness. Negative for cough and shortness of breath.   Cardiovascular: Negative for chest pain and leg swelling.  Gastrointestinal: Positive for nausea. Negative for vomiting, abdominal pain and diarrhea.  Genitourinary: Negative for dysuria.  Musculoskeletal: Negative for myalgias.  Skin: Negative for rash.  Neurological: Positive for dizziness, light-headedness and headaches. Negative for syncope and weakness.    Allergies  Frovatriptan; Sulfa antibiotics; and Tylenol  Home Medications   Prior to Admission medications   Medication Sig Start Date End Date Taking? Authorizing Provider  albuterol (PROVENTIL HFA;VENTOLIN HFA) 108 (90 BASE) MCG/ACT inhaler Inhale 2 puffs into the lungs every 6 (six) hours as needed for wheezing or shortness of breath.    Historical Provider, MD  alprazolam Prudy Feeler) 2 MG tablet Take 2 mg by mouth 3 (three) times daily as needed for anxiety.     Historical Provider, MD  ibuprofen (ADVIL,MOTRIN) 600 MG tablet Take 600-1,200 mg by mouth every 6 (six) hours as needed for headache, mild pain or moderate pain (migraine).     Historical Provider, MD  ibuprofen (ADVIL,MOTRIN) 800 MG tablet Take 800-1,600 mg by mouth every 8 (eight) hours as needed for headache, mild pain or moderate pain (migraine).  Historical Provider, MD  lisinopril (PRINIVIL,ZESTRIL) 10 MG tablet Take 20 mg by mouth at bedtime.     Historical Provider, MD  Oxycodone HCl 10 MG TABS Take 10 mg by mouth every 8 (eight) hours. 01/28/15   Historical Provider, MD  TESTOSTERONE IM Inject 100 mg  into the muscle See admin instructions. Every 10 days    Historical Provider, MD  tiZANidine (ZANAFLEX) 4 MG tablet Take 4 mg by mouth every 6 (six) hours as needed for muscle spasms.    Historical Provider, MD  topiramate (TOPAMAX) 50 MG tablet Take 50 mg by mouth 2 (two) times daily as needed (migraines).  04/25/15   Historical Provider, MD  traZODone (DESYREL) 100 MG tablet Take 100 mg by mouth at bedtime as needed for sleep.     Historical Provider, MD   BP 135/80 mmHg  Pulse 86  Temp(Src) 99.4 F (37.4 C) (Oral)  Resp 16  SpO2 100%   Physical Exam  Constitutional: He appears well-developed and well-nourished.  HENT:  Head: Normocephalic and atraumatic.  Right Ear: Hearing, tympanic membrane and ear canal normal.  Left Ear: Hearing, tympanic membrane and ear canal normal.  Nose: No mucosal edema or rhinorrhea.  Mouth/Throat: Uvula is midline, oropharynx is clear and moist and mucous membranes are normal.  Eyes: Conjunctivae are normal. Right eye exhibits no discharge. Left eye exhibits no discharge.  Neck: Normal range of motion. Neck supple.  Cardiovascular: Normal rate, regular rhythm and normal heart sounds.   No murmur heard. Pulmonary/Chest: Effort normal and breath sounds normal. No respiratory distress. He has no wheezes. He has no rales.  Abdominal: Soft. There is no tenderness.  Musculoskeletal: He exhibits no tenderness.  No lower extremity swelling or tenderness.   Neurological: He is alert.  Skin: Skin is warm and dry.  Psychiatric: He has a normal mood and affect.  Nursing note and vitals reviewed.   ED Course  Procedures (including critical care time) Labs Review Labs Reviewed  I-STAT CHEM 8, ED - Abnormal; Notable for the following:    Potassium 2.9 (*)    All other components within normal limits  D-DIMER, QUANTITATIVE (NOT AT [facility])  CBC  I-STAT TROPOININ, ED    Imaging Review No results found. I have personally reviewed and evaluated these images  and lab results as part of my medical decision-making.   EKG Interpretation   Date/Time:  Tuesday August 06 2015 09:27:15 EDT Ventricular Rate:  80 PR Interval:  146 QRS Duration: 78 QT Interval:  367 QTC Calculation: 423 R Axis:   49 Text Interpretation:  Sinus rhythm Confirmed by LITTLE MD, RACHEL ([id number])  on 08/06/2015 11:15:22 AM        9:15 AM Patient seen and examined. Work-up initiated.   Vital signs reviewed and are as follows: BP 135/80 mmHg  Pulse 86  Temp(Src) 99.4 F (37.4 C) (Oral)  Resp 16  SpO2 100%  Discussed with Dr. Clarene Duke. Patient with vague symptoms including lightheadedness and chest tightness. He has risk factors for thromboembolism including hormone use and recent long car ride. Low suspicion for PE based on exam, but given risks will screen for PE with d-dimer.   10:52 AM Labs reviewed. Will ambulate after fluids.   12:15 PM Ambulated without difficulty after fluids. Patient states that he is feeling better. Will discharge to home with prescription for lisinopril as well as potassium due to low potassium today. This was treated with PO potassium in ED.  Encourage follow-up with the  PCP in the next week. Patient is to return to the emergency department with shortness of breath, chest pain, fever, syncope, or other concerns.  MDM   Final diagnoses:  Lightheadedness  Essential hypertension  Hypokalemia   Patient with vague symptoms of dizziness, malaise, headache. Near-syncope workup undertaken and is unremarkable. There is slightly low potassium, no EKG changes, treated in ED. Will restart on blood pressure medications. Patient treated with fluids with improvement in his orthostasis. He appears well at time of discharge and wants to go home.    Renne Crigler, PA-C 08/06/15 1216  Laurence Spates, MD 08/07/15 [phone]

## 2015-11-03 HISTORY — PX: MASTECTOMY: SHX3

## 2016-01-03 ENCOUNTER — Emergency Department (HOSPITAL_COMMUNITY)
Admission: EM | Admit: 2016-01-03 | Discharge: 2016-01-03 | Disposition: A | Discharge status: Home / Self Care | Payer: BLUE CROSS/BLUE SHIELD | Attending: Emergency Medicine | Admitting: Emergency Medicine

## 2016-01-03 ENCOUNTER — Encounter (HOSPITAL_COMMUNITY): Payer: Self-pay | Admitting: Emergency Medicine

## 2016-01-03 DIAGNOSIS — H6121 Impacted cerumen, right ear: Secondary | ICD-10-CM | POA: Diagnosis not present

## 2016-01-03 DIAGNOSIS — Z87438 Personal history of other diseases of male genital organs: Secondary | ICD-10-CM | POA: Diagnosis not present

## 2016-01-03 DIAGNOSIS — J069 Acute upper respiratory infection, unspecified: Secondary | ICD-10-CM | POA: Insufficient documentation

## 2016-01-03 DIAGNOSIS — Z79891 Long term (current) use of opiate analgesic: Secondary | ICD-10-CM | POA: Insufficient documentation

## 2016-01-03 DIAGNOSIS — I10 Essential (primary) hypertension: Secondary | ICD-10-CM | POA: Diagnosis not present

## 2016-01-03 DIAGNOSIS — Z7951 Long term (current) use of inhaled steroids: Secondary | ICD-10-CM | POA: Insufficient documentation

## 2016-01-03 DIAGNOSIS — G43909 Migraine, unspecified, not intractable, without status migrainosus: Secondary | ICD-10-CM | POA: Diagnosis not present

## 2016-01-03 DIAGNOSIS — J029 Acute pharyngitis, unspecified: Secondary | ICD-10-CM | POA: Diagnosis present

## 2016-01-03 DIAGNOSIS — Z79899 Other long term (current) drug therapy: Secondary | ICD-10-CM | POA: Diagnosis not present

## 2016-01-03 DIAGNOSIS — J45909 Unspecified asthma, uncomplicated: Secondary | ICD-10-CM | POA: Insufficient documentation

## 2016-01-03 MED ORDER — FLUTICASONE PROPIONATE 50 MCG/ACT NA SUSP: 2.0000 | Freq: Every day | NASAL | Status: DC

## 2016-01-03 NOTE — ED Provider Notes (Signed)
CSN: [id number]     Arrival date & time 01/03/16  0805 History   First MD Initiated Contact with Patient 01/03/16 [phone]     Chief Complaint  Patient presents with  . Otalgia  . Sore Throat     HPI   35 year old male presents today with complaints of URI symptoms. Patient reports that on Monday started developing a runny nose, congestion, dry nonproductive cough and bilateral ear pain. He reports that he saw primary care on Monday who prescribed Mucinex. He reports this did not improve symptoms, noting that he had azithromycin at home and is on the third day treatment of a 5 day course. He reports symptoms have not worsened but have not improved, he denies any fever at home, chest pain, shortness of breath, productive cough, purulent or unilateral discharge from the nose. Patient denies any difficulty swallowing or breathing, changes in voice, inability to handle or tolerate oral secretions.  Patient also reports he suffers from migraines currently having a migraine this is bilateral temporal with no focal neurological deficits, no headache red flags. He reports this started slow and is typical of his migraines.   Past Medical History  Diagnosis Date  . Endometriosis   . Hypertension   . Migraine   . Asthma   . Bronchitis    Past Surgical History  Procedure Laterality Date  . Ankle surgery     No family history on file. Social History  Substance Use Topics  . Smoking status: Never Smoker   . Smokeless tobacco: None  . Alcohol Use: No    Review of Systems  All other systems reviewed and are negative.     Allergies  Frovatriptan; Sulfa antibiotics; and Tylenol  Home Medications   Prior to Admission medications   Medication Sig Start Date End Date Taking? Authorizing Provider  ibuprofen (ADVIL,MOTRIN) 800 MG tablet Take 800-1,600 mg by mouth every 8 (eight) hours as needed for headache, mild pain or moderate pain (migraine).    Yes Historical Provider, MD  lisinopril  (PRINIVIL,ZESTRIL) 20 MG tablet Take 1 tablet (20 mg total) by mouth daily. 08/06/15  Yes Renne CriglerJoshua Geiple, PA-C  Oxycodone HCl 10 MG TABS Take 10 mg by mouth every 8 (eight) hours. 01/28/15  Yes Historical Provider, MD  albuterol (PROVENTIL HFA;VENTOLIN HFA) 108 (90 BASE) MCG/ACT inhaler Inhale 2 puffs into the lungs every 6 (six) hours as needed for wheezing or shortness of breath.    Historical Provider, MD  alprazolam Prudy Feeler(XANAX) 2 MG tablet Take 2 mg by mouth 3 (three) times daily as needed for anxiety.     Historical Provider, MD  fluticasone (FLONASE) 50 MCG/ACT nasal spray Place 2 sprays into both nostrils daily. 01/03/16   Eyvonne MechanicJeffrey Toye Rouillard, PA-C  potassium chloride 20 MEQ TBCR Take 20 mEq by mouth daily. 08/06/15   Renne CriglerJoshua Geiple, PA-C  TESTOSTERONE IM Inject 100 mg into the muscle See admin instructions. Every 10 days    Historical Provider, MD  tiZANidine (ZANAFLEX) 4 MG tablet Take 4 mg by mouth every 6 (six) hours as needed for muscle spasms.    Historical Provider, MD  traZODone (DESYREL) 100 MG tablet Take 100 mg by mouth at bedtime as needed for sleep.     Historical Provider, MD   BP 144/95 mmHg  Pulse 93  Temp(Src) 98 F (36.7 C) (Oral)  Resp 18  SpO2 98%   Physical Exam  Constitutional: He is oriented to person, place, and time. He appears well-developed and well-nourished.  HENT:  Head: Normocephalic and atraumatic.  Right Ear: Hearing normal.  Left Ear: Hearing, tympanic membrane, external ear and ear canal normal.  Mouth/Throat: Uvula is midline, oropharynx is clear and moist and mucous membranes are normal. No oropharyngeal exudate, posterior oropharyngeal edema, posterior oropharyngeal erythema or tonsillar abscesses.  Cerumen impaction right  Eyes: Conjunctivae are normal. Pupils are equal, round, and reactive to light. Right eye exhibits no discharge. Left eye exhibits no discharge. No scleral icterus.  Neck: Normal range of motion. No JVD present. No tracheal deviation present.   Cardiovascular: Regular rhythm, normal heart sounds and intact distal pulses.  Exam reveals no gallop and no friction rub.   No murmur heard. Pulmonary/Chest: Effort normal and breath sounds normal. No stridor. No respiratory distress. He has no wheezes. He has no rales. He exhibits no tenderness.  Neurological: He is alert and oriented to person, place, and time. He has normal strength. No cranial nerve deficit or sensory deficit. Coordination and gait normal. GCS eye subscore is 4. GCS verbal subscore is 5. GCS motor subscore is 6.  Reflex Scores:      Patellar reflexes are 2+ on the right side and 2+ on the left side. Skin: Skin is warm and dry. No rash noted. No erythema. No pallor.  Psychiatric: He has a normal mood and affect. His behavior is normal. Judgment and thought content normal.  Nursing note and vitals reviewed.   ED Course  Procedures (including critical care time) Labs Review Labs Reviewed - No data to display  Imaging Review No results found. I have personally reviewed and evaluated these images and lab results as part of my medical decision-making.   EKG Interpretation None      MDM   Final diagnoses:  URI (upper respiratory infection)    Labs:   Imaging:  Consults:  Therapeutics:  Discharge Meds: Flonase  Assessment/Plan: 35 year old male presents with likely viral URI. Symptoms have been present for 5 days, patient using Mucinex and recently started azithromycin on day 3. Patient has no focal signs of bacterial infection.  Patient is afebrile, nontoxic in no acute distress. This is likely a viral URI, patient currently on day 3 of azithromycin he is encouraged to continue using antibiotic. Patient will be instructed use Flonase, Nettie pot, follow-up with primary care in 3 days for reevaluation of symptoms persist. Patient given strict return precautions, he verbalized understanding and agreement to today's plan and had no further questions or concerns  at time of discharge.        Eyvonne Mechanic, PA-C 01/03/16 [id number]  Tilden Fossa, MD 01/03/16 1041

## 2016-01-03 NOTE — ED Notes (Signed)
Patient states has had bilateral ear pain and sore throat with chills since Monday.   Patient states he went to his doctor on Tuesday and received mucinex and a prescription of nortriptyline.   Patient is with a pain management group.   Patient states he has headache and goes to neurologist for his chronic headaches.

## 2016-01-03 NOTE — Discharge Instructions (Signed)
Upper Respiratory Infection, Adult Most upper respiratory infections (URIs) are a viral infection of the air passages leading to the lungs. A URI affects the nose, throat, and upper air passages. The most common type of URI is nasopharyngitis and is typically referred to as "the common cold." URIs run their course and usually go away on their own. Most of the time, a URI does not require medical attention, but sometimes a bacterial infection in the upper airways can follow a viral infection. This is called a secondary infection. Sinus and middle ear infections are common types of secondary upper respiratory infections. Bacterial pneumonia can also complicate a URI. A URI can worsen asthma and chronic obstructive pulmonary disease (COPD). Sometimes, these complications can require emergency medical care and may be life threatening.  CAUSES Almost all URIs are caused by viruses. A virus is a type of germ and can spread from one person to another.  RISKS FACTORS You may be at risk for a URI if:   You smoke.   You have chronic heart or lung disease.  You have a weakened defense (immune) system.   You are very young or very old.   You have nasal allergies or asthma.  You work in crowded or poorly ventilated areas.  You work in health care facilities or schools. SIGNS AND SYMPTOMS  Symptoms typically develop 2-3 days after you come in contact with a cold virus. Most viral URIs last 7-10 days. However, viral URIs from the influenza virus (flu virus) can last 14-18 days and are typically more severe. Symptoms may include:   Runny or stuffy (congested) nose.   Sneezing.   Cough.   Sore throat.   Headache.   Fatigue.   Fever.   Loss of appetite.   Pain in your forehead, behind your eyes, and over your cheekbones (sinus pain).  Muscle aches.  DIAGNOSIS  Your health care provider may diagnose a URI by:  Physical exam.  Tests to check that your symptoms are not due to  another condition such as:  Strep throat.  Sinusitis.  Pneumonia.  Asthma. TREATMENT  A URI goes away on its own with time. It cannot be cured with medicines, but medicines may be prescribed or recommended to relieve symptoms. Medicines may help:  Reduce your fever.  Reduce your cough.  Relieve nasal congestion. HOME CARE INSTRUCTIONS   Take medicines only as directed by your health care provider.   Gargle warm saltwater or take cough drops to comfort your throat as directed by your health care provider.  Use a warm mist humidifier or inhale steam from a shower to increase air moisture. This may make it easier to breathe.  Drink enough fluid to keep your urine clear or pale yellow.   Eat soups and other clear broths and maintain good nutrition.   Rest as needed.   Return to work when your temperature has returned to normal or as your health care provider advises. You may need to stay home longer to avoid infecting others. You can also use a face mask and careful hand washing to prevent spread of the virus.  Increase the usage of your inhaler if you have asthma.   Do not use any tobacco products, including cigarettes, chewing tobacco, or electronic cigarettes. If you need help quitting, ask your health care provider. PREVENTION  The best way to protect yourself from getting a cold is to practice good hygiene.   Avoid oral or hand contact with people with cold  symptoms.   Wash your hands often if contact occurs.  There is no clear evidence that vitamin C, vitamin E, echinacea, or exercise reduces the chance of developing a cold. However, it is always recommended to get plenty of rest, exercise, and practice good nutrition.  SEEK MEDICAL CARE IF:   You are getting worse rather than better.   Your symptoms are not controlled by medicine.   You have chills.  You have worsening shortness of breath.  You have brown or red mucus.  You have yellow or brown nasal  discharge.  You have pain in your face, especially when you bend forward.  You have a fever.  You have swollen neck glands.  You have pain while swallowing.  You have white areas in the back of your throat. SEEK IMMEDIATE MEDICAL CARE IF:   You have severe or persistent:  Headache.  Ear pain.  Sinus pain.  Chest pain.  You have chronic lung disease and any of the following:  Wheezing.  Prolonged cough.  Coughing up blood.  A change in your usual mucus.  You have a stiff neck.  You have changes in your:  Vision.  Hearing.  Thinking.  Mood. MAKE SURE YOU:   Understand these instructions.  Will watch your condition.  Will get help right away if you are not doing well or get worse.   This information is not intended to replace advice given to you by your health care provider. Make sure you discuss any questions you have with your health care provider.   Document Released: 04/14/2001 Document Revised: 03/05/2015 Document Reviewed: 01/24/2014 [employer] Interactive Patient Education 2016 [employer]   Please use Flonase, Nettie pot, Tylenol or ibuprofen as needed for discomfort. Please follow-up with her primary care symptoms continue to persist, please return to the emergency room if new or worsening signs or symptoms present.

## 2016-02-15 ENCOUNTER — Encounter (HOSPITAL_COMMUNITY): Payer: Self-pay | Admitting: *Deleted

## 2016-02-15 ENCOUNTER — Emergency Department (HOSPITAL_COMMUNITY)
Admission: EM | Admit: 2016-02-15 | Discharge: 2016-02-15 | Disposition: A | Discharge status: Home / Self Care | Payer: Medicare Other | Attending: Emergency Medicine | Admitting: Emergency Medicine

## 2016-02-15 ENCOUNTER — Emergency Department (HOSPITAL_COMMUNITY): Payer: Medicare Other

## 2016-02-15 DIAGNOSIS — Z87448 Personal history of other diseases of urinary system: Secondary | ICD-10-CM | POA: Insufficient documentation

## 2016-02-15 DIAGNOSIS — J45909 Unspecified asthma, uncomplicated: Secondary | ICD-10-CM | POA: Diagnosis not present

## 2016-02-15 DIAGNOSIS — R079 Chest pain, unspecified: Secondary | ICD-10-CM | POA: Diagnosis not present

## 2016-02-15 DIAGNOSIS — G43909 Migraine, unspecified, not intractable, without status migrainosus: Secondary | ICD-10-CM | POA: Insufficient documentation

## 2016-02-15 DIAGNOSIS — Z79899 Other long term (current) drug therapy: Secondary | ICD-10-CM | POA: Diagnosis not present

## 2016-02-15 DIAGNOSIS — I10 Essential (primary) hypertension: Secondary | ICD-10-CM | POA: Diagnosis not present

## 2016-02-15 LAB — CBC
HCT: 49.2 % (ref 39.0–52.0)
Hemoglobin: 17.2 g/dL — ABNORMAL HIGH (ref 13.0–17.0)
MCH: 30.1 pg (ref 26.0–34.0)
MCHC: 35 g/dL (ref 30.0–36.0)
MCV: 86.2 fL (ref 78.0–100.0)
PLATELETS: 263 10*3/uL (ref 150–400)
RBC: 5.71 MIL/uL (ref 4.22–5.81)
RDW: 13 % (ref 11.5–15.5)
WBC: 5.2 10*3/uL (ref 4.0–10.5)

## 2016-02-15 LAB — BASIC METABOLIC PANEL
Anion gap: 11 (ref 5–15)
BUN: 10 mg/dL (ref 6–20)
CALCIUM: 9.5 mg/dL (ref 8.9–10.3)
CO2: 24 mmol/L (ref 22–32)
CREATININE: 1.33 mg/dL — AB (ref 0.61–1.24)
Chloride: 105 mmol/L (ref 101–111)
GFR calc Af Amer: >60 mL/min (ref >60)
GLUCOSE: 101 mg/dL — AB (ref 65–99)
POTASSIUM: 3.7 mmol/L (ref 3.5–5.1)
SODIUM: 140 mmol/L (ref 135–145)

## 2016-02-15 LAB — I-STAT TROPONIN, ED
TROPONIN I, POC: 0 ng/mL (ref 0.00–0.08)
Troponin i, poc: 0 ng/mL (ref 0.00–0.08)

## 2016-02-15 LAB — TROPONIN I

## 2016-02-15 LAB — D-DIMER, QUANTITATIVE: D-Dimer, Quant: <0.27 ug/mL-FEU (ref 0.00–0.50)

## 2016-02-15 MED ORDER — IBUPROFEN 800 MG PO TABS
800.0000 mg | ORAL_TABLET | Freq: Once | ORAL | Status: AC
  Administered 2016-02-15: 800 mg via ORAL
  Filled 2016-02-15: qty 1

## 2016-02-15 NOTE — ED Provider Notes (Signed)
CSN: [id number]     Arrival date & time 02/15/16  0506 History   First MD Initiated Contact with Patient 02/15/16 [phone]     Chief Complaint  Patient presents with  . Chest Pain     (Consider location/radiation/quality/duration/timing/severity/associated sxs/prior Treatment) HPI This is a 35 year old transgender, identifies his male who presents today complaining of right-sided chest pain. He states he woke up at 345 with right upper chest pain that is sharp in nature. He is having some shortness of breath due to dyspnea. He states that the pain is sharp in nature. It radiates down his right arm. He has not had any similar symptoms to this recently. Denies any history of blood clots in his legs or lungs. He denies any DVT risk factors, leg swelling, lateralized leg pain. He has not had fever or chills. He denies any history of coronary artery disease, drug use, caffeine use, or smoking area he does have a history of hypertension and is taking lisinopril for this. Past Medical History  Diagnosis Date  . Endometriosis   . Hypertension   . Migraine   . Asthma   . Bronchitis    Past Surgical History  Procedure Laterality Date  . Ankle surgery     No family history on file. Social History  Substance Use Topics  . Smoking status: Never Smoker   . Smokeless tobacco: None  . Alcohol Use: No    Review of Systems  All other systems reviewed and are negative.     Allergies  Frovatriptan; Sulfa antibiotics; and Tylenol  Home Medications   Prior to Admission medications   Medication Sig Start Date End Date Taking? Authorizing Provider  albuterol (PROVENTIL HFA;VENTOLIN HFA) 108 (90 BASE) MCG/ACT inhaler Inhale 2 puffs into the lungs every 6 (six) hours as needed for wheezing or shortness of breath.   Yes Historical Provider, MD  alprazolam Prudy Feeler(XANAX) 2 MG tablet Take 2 mg by mouth 3 (three) times daily as needed for anxiety.    Yes Historical Provider, MD  ibuprofen (ADVIL,MOTRIN) 800 MG  tablet Take 800-1,600 mg by mouth every 8 (eight) hours as needed for headache, mild pain or moderate pain (migraine).    Yes Historical Provider, MD  lisinopril (PRINIVIL,ZESTRIL) 20 MG tablet Take 1 tablet (20 mg total) by mouth daily. 08/06/15  Yes Renne CriglerJoshua Geiple, PA-C  Oxycodone HCl 10 MG TABS Take 10 mg by mouth every 8 (eight) hours. 01/28/15  Yes Historical Provider, MD  TESTOSTERONE IM Inject 100 mg into the muscle See admin instructions. Every 10 days   Yes Historical Provider, MD  tiZANidine (ZANAFLEX) 4 MG tablet Take 4 mg by mouth every 6 (six) hours as needed for muscle spasms.   Yes Historical Provider, MD  traZODone (DESYREL) 100 MG tablet Take 100 mg by mouth at bedtime as needed for sleep.    Yes Historical Provider, MD   BP 151/99 mmHg  Pulse 68  Temp(Src) 98.3 F (36.8 C)  Resp 21  Ht 5\' 4"  (1.626 m)  Wt 78.926 kg  BMI 29.85 kg/m2  SpO2 98% Physical Exam  Constitutional: He is oriented to person, place, and time. He appears well-developed and well-nourished. No distress.  HENT:  Head: Normocephalic and atraumatic.  Eyes: Conjunctivae are normal. Pupils are equal, round, and reactive to light.  Neck: Normal range of motion. Neck supple.  Cardiovascular: Normal rate, regular rhythm, normal heart sounds and intact distal pulses.   Pulmonary/Chest: Effort normal and breath sounds normal. No respiratory distress.  He has no wheezes. He has no rales. He exhibits tenderness.    Abdominal: Soft. Bowel sounds are normal.  Musculoskeletal: Normal range of motion. He exhibits no edema or tenderness.  Neurological: He is alert and oriented to person, place, and time. He has normal reflexes. He displays normal reflexes. No cranial nerve deficit. Coordination normal.  Skin: Skin is warm and dry.  Psychiatric: He has a normal mood and affect.  Nursing note and vitals reviewed.   ED Course  Procedures (including critical care time) Labs Review Labs Reviewed  BASIC METABOLIC PANEL  - Abnormal; Notable for the following:    Glucose, Bld 101 (*)    Creatinine, Ser 1.33 (*)    All other components within normal limits  CBC - Abnormal; Notable for the following:    Hemoglobin 17.2 (*)    All other components within normal limits  TROPONIN I  D-DIMER, QUANTITATIVE (NOT AT [facility])  Rosezena Sensor, ED    Imaging Review Dg Chest 2 View  02/15/2016  CLINICAL DATA:  Awakened with right-sided chest pain radiating through to the back. Dyspnea. EXAM: CHEST  2 VIEW COMPARISON:  None. FINDINGS: The heart size and mediastinal contours are within normal limits. Both lungs are clear. The visualized skeletal structures are unremarkable. IMPRESSION: No active cardiopulmonary disease. Electronically Signed   By: Ellery Plunk M.D.   On: 02/15/2016 06:18   I have personally reviewed and evaluated these images and lab results as part of my medical decision-making.   EKG Interpretation   Date/Time:  Saturday February 15 2016 05:13:22 EDT Ventricular Rate:  91 PR Interval:  130 QRS Duration: 74 QT Interval:  364 QTC Calculation: 447 R Axis:   51 Text Interpretation:  Normal sinus rhythm Normal ECG Confirmed by Tarez Bowns MD,  Duwayne Heck ([id number]) on 02/15/2016 8:28:19 AM      MDM   Final diagnoses:  Chest pain, unspecified chest pain type  35 year old transgender male presents today with pleuritic right-sided chest pain.  DDX-  Pneumothorax-cxr no pneumo or acute pulmonary disease Pneumonia pe Cardiac ischemia-normal ekg, first trop normal, plan delta now Dissection Musculoskeletal- some reproduction.       Margarita Grizzle, MD 02/15/16 1125

## 2016-02-15 NOTE — Discharge Instructions (Signed)

## 2016-02-15 NOTE — ED Notes (Signed)
The pt woke up at 345 with rt upper chest and shoulder pain.  C/o sob  None now.  No previous history

## 2016-02-15 NOTE — ED Notes (Signed)
Pt sitting in the chairs at triage  Moved chart back in waiting  She has not left

## 2016-02-15 NOTE — ED Notes (Signed)
Called X 2, no answer.
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
## 2016-06-18 ENCOUNTER — Encounter (HOSPITAL_COMMUNITY): Payer: Self-pay | Admitting: *Deleted

## 2016-06-18 ENCOUNTER — Emergency Department (HOSPITAL_COMMUNITY)
Admission: EM | Admit: 2016-06-18 | Discharge: 2016-06-18 | Disposition: A | Discharge status: Home / Self Care | Payer: BLUE CROSS/BLUE SHIELD | Attending: Emergency Medicine | Admitting: Emergency Medicine

## 2016-06-18 DIAGNOSIS — Y939 Activity, unspecified: Secondary | ICD-10-CM | POA: Insufficient documentation

## 2016-06-18 DIAGNOSIS — Y999 Unspecified external cause status: Secondary | ICD-10-CM | POA: Insufficient documentation

## 2016-06-18 DIAGNOSIS — M25512 Pain in left shoulder: Secondary | ICD-10-CM | POA: Diagnosis not present

## 2016-06-18 DIAGNOSIS — J45909 Unspecified asthma, uncomplicated: Secondary | ICD-10-CM | POA: Insufficient documentation

## 2016-06-18 DIAGNOSIS — M25511 Pain in right shoulder: Secondary | ICD-10-CM | POA: Diagnosis not present

## 2016-06-18 DIAGNOSIS — Y92410 Unspecified street and highway as the place of occurrence of the external cause: Secondary | ICD-10-CM | POA: Diagnosis not present

## 2016-06-18 DIAGNOSIS — I10 Essential (primary) hypertension: Secondary | ICD-10-CM | POA: Diagnosis not present

## 2016-06-18 MED ORDER — TRAMADOL HCL 50 MG PO TABS: 50.0000 mg | ORAL_TABLET | Freq: Four times a day (QID) | ORAL | 0 refills | Status: DC | PRN

## 2016-06-18 MED ORDER — METHOCARBAMOL 500 MG PO TABS: 500.0000 mg | ORAL_TABLET | Freq: Two times a day (BID) | ORAL | 0 refills | Status: DC

## 2016-06-18 NOTE — ED Notes (Signed)
Declined W/C at D/C and was escorted to lobby by RN. 
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
## 2016-08-19 DIAGNOSIS — F649 Gender identity disorder, unspecified: Secondary | ICD-10-CM | POA: Insufficient documentation

## 2018-07-18 ENCOUNTER — Other Ambulatory Visit: Payer: Self-pay

## 2018-07-18 ENCOUNTER — Ambulatory Visit (HOSPITAL_COMMUNITY)
Admission: EM | Admit: 2018-07-18 | Discharge: 2018-07-18 | Disposition: A | Discharge status: Home / Self Care | Payer: Medicare Other | Attending: Family Medicine | Admitting: Family Medicine

## 2018-07-18 ENCOUNTER — Encounter (HOSPITAL_COMMUNITY): Payer: Self-pay | Admitting: Emergency Medicine

## 2018-07-18 DIAGNOSIS — H6121 Impacted cerumen, right ear: Secondary | ICD-10-CM | POA: Diagnosis not present

## 2018-07-18 NOTE — ED Provider Notes (Signed)
[place]    CSN: [id number] Arrival date & time: 07/18/18  1655     History   Chief Complaint Chief Complaint  Patient presents with  . Ear Problem    HPI Anthony Rowe is a 37 y.o. adult.   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  He notes that he has used Q-tips which may have caused an earwax impaction.  He is tried a home earwax removal kit which was unsuccessful.  Is having no trouble with his left ear.  Patient denies any pain or fever     Past Medical History:  Diagnosis Date  . Asthma   . Bronchitis   . Endometriosis   . Hypertension   . Migraine     Patient Active Problem List   Diagnosis Date Noted  . Left ankle pain 09/25/2013  . Hypovitaminosis D 09/25/2013    Past Surgical History:  Procedure Laterality Date  . ANKLE SURGERY         Home Medications    Prior to Admission medications   Medication Sig Start Date End Date Taking? Authorizing Provider  albuterol (PROVENTIL HFA;VENTOLIN HFA) 108 (90 BASE) MCG/ACT inhaler Inhale 2 puffs into the lungs every 6 (six) hours as needed for wheezing or shortness of breath.    [provider]  ibuprofen (ADVIL,MOTRIN) 800 MG tablet Take 800-1,600 mg by mouth every 8 (eight) hours as needed for headache, mild pain or moderate pain (migraine).     [provider]  lisinopril (PRINIVIL,ZESTRIL) 20 MG tablet Take 1 tablet (20 mg total) by mouth daily. 08/06/15   Carlisle Cater, PA-C  Oxycodone HCl 10 MG TABS Take 10 mg by mouth every 8 (eight) hours. 01/28/15   [provider]  TESTOSTERONE IM Inject 100 mg into the muscle See admin instructions. Every 10 days    [provider]  tiZANidine (ZANAFLEX) 4 MG tablet Take 4 mg by mouth every 6 (six) hours as needed for muscle spasms.    [provider]  traZODone (DESYREL) 100 MG tablet Take 100 mg by mouth at bedtime as needed for sleep.      [provider]    Family History History reviewed. No pertinent family history.  Social History Social History   Tobacco Use  . Smoking status: Never Smoker  Substance Use Topics  . Alcohol use: No  . Drug use: No     Allergies   Frovatriptan; Sulfa antibiotics; and Tylenol [acetaminophen]   Review of Systems Review of Systems  Constitutional: Negative.   HENT: Positive for hearing loss. Negative for congestion and ear pain.      Physical Exam Triage Vital Signs ED Triage Vitals  Enc Vitals Group     BP 07/18/18 1730 117/71     Pulse Rate 07/18/18 1730 (!) 102     Resp 07/18/18 1730 18     Temp 07/18/18 1730 97.9 F (36.6 C)     Temp Source 07/18/18 1730 Oral     SpO2 07/18/18 1730 98 %     Weight --      Height --      Head Circumference --      Peak Flow --      Pain Score 07/18/18 1727 0     Pain Loc --      Pain Edu? --      Excl. in [place]? --  No data found.  Updated Vital Signs BP 117/71 (BP Location: Right Arm)   Pulse (!) 102   Temp 97.9 F (36.6 C) (Oral)   Resp 18   SpO2 98%   Physical Exam  Constitutional: He is oriented to person, place, and time. He appears well-developed and well-nourished.  HENT:  Head: Normocephalic.  Cerumen impaction on the right, moderate cerumen on the left  Eyes: Conjunctivae are normal.  Neck: Normal range of motion. Neck supple.  Pulmonary/Chest: Effort normal.  Musculoskeletal: Normal range of motion.  Neurological: He is alert and oriented to person, place, and time.  Skin: Skin is warm and dry.  Nursing note and vitals reviewed.    UC Treatments / Results  Labs (all labs ordered are listed, but only abnormal results are displayed) Labs Reviewed - No data to display  EKG None  Radiology No results found.  Procedures Procedures (including critical care time)  Medications Ordered in UC Medications - No data to display  Initial Impression / Assessment and Plan / UC Course  I have  reviewed the triage vital signs and the nursing notes.  Pertinent labs & imaging results that were available during my care of the patient were reviewed by me and considered in my medical decision making (see chart for details).    Final Clinical Impressions(s) / UC Diagnoses   Final diagnoses:  Impacted cerumen of right ear   Discharge Instructions   None    ED Prescriptions    None     Controlled Substance Prescriptions [place] Controlled Substance Registry consulted? Not Applicable   Robyn Haber, MD 07/18/18 1819

## 2018-07-18 NOTE — ED Triage Notes (Signed)
Unable to hear out of right ear for a week

## 2018-07-27 ENCOUNTER — Encounter: Payer: Self-pay | Admitting: Allergy

## 2018-07-27 ENCOUNTER — Ambulatory Visit (INDEPENDENT_AMBULATORY_CARE_PROVIDER_SITE_OTHER): Payer: Medicare Other | Admitting: Allergy

## 2018-07-27 ENCOUNTER — Telehealth: Payer: Self-pay

## 2018-07-27 VITALS — BP 122/68 | HR 92 | Temp 98.6°F | Resp 16 | Ht 63.0 in | Wt 163.8 lb

## 2018-07-27 DIAGNOSIS — J4530 Mild persistent asthma, uncomplicated: Secondary | ICD-10-CM

## 2018-07-27 DIAGNOSIS — L299 Pruritus, unspecified: Secondary | ICD-10-CM

## 2018-07-27 MED ORDER — LEVOCETIRIZINE DIHYDROCHLORIDE 5 MG PO TABS: 5.0000 mg | ORAL_TABLET | Freq: Two times a day (BID) | ORAL | 5 refills | Status: DC

## 2018-07-27 MED ORDER — ALBUTEROL SULFATE HFA 108 (90 BASE) MCG/ACT IN AERS: INHALATION_SPRAY | RESPIRATORY_TRACT | 1 refills | Status: DC

## 2018-07-27 MED ORDER — FAMOTIDINE 20 MG PO TABS
20.0000 mg | ORAL_TABLET | Freq: Two times a day (BID) | ORAL | 5 refills | Status: DC
Start: 2018-07-27 — End: 2018-08-08

## 2018-07-27 NOTE — Telephone Encounter (Signed)
Left message with staff member regarding patient labs. Message was sent to the nurse to have them call back to let us know what labs have been drawn.

## 2018-07-27 NOTE — Patient Instructions (Addendum)
Chronic itch   - itch for years that's worsen since August   - at this time etiology of itch is unknown.  Itch can be caused by a variety of different triggers including illness/infection, foods, medications, stings, exercise, pressure, vibrations, extremes of temperature to name a few however there may be no identifiable trigger.  Opiate medication use (oxycodone) can cause itch as a side effect.     - will obtain environmental panel and basic food panel at this time and await to here from your PCP regarding the labs you recently had done.   Once we get your recent labs done if I need to add additional labs we will let you know.     - recommend taking [id number][facility]7[facility] ours as needed for cough/wheeze/shortness of breath/chest tightness.  May use 15-20 minutes prior to activity.   Monitor frequency of use.    Asthma control goals:   Full participation in all desired activities (may need albuterol before activity)  Albuterol use two time or less a week on average (not counting use with activity)  Cough interfering with sleep two time or less a month  Oral steroids no more than once a year  No hospitalizations  Follow-up 2-3 months or sooner if needed

## 2018-07-27 NOTE — Progress Notes (Signed)
New Patient Note  RE: Anthony Rowe MRN: [id number] DOB: 1981/10/01 Date of Office Visit: 07/27/2018  Referring provider: Ronney Lion, NP Primary care provider: Fleet Contras, MD  Chief Complaint: itching  History of present illness: Anthony Rowe is a 37 y.o. adult presenting today for consultation for itching.   Itching all over for years but states the itching is getting worse since August. Itching is constant throughout the day.  He states there can be times where it just a patch of skin that may itch and not be widespread itching but something is also itching.  He denies having visible or palpable rash.  No one else in the household has similar symptoms.  He has not been able to identify any exacerbating factors.  He denies any foods that seem to make the itch worse.  Denies any recent travel prior to the itching getting worse.  He has been to urgent care for the symptoms and states he was told to take Claritin once a day but that has not helped.  He also has tried levocetirizine once a day as well as hydroxyzine once a day neither of these things have helped with the itch.  However he states he has been taking antihistamine and has not stopped for this appointment.  He did have labs done by the urgent care and states he was told that his thyroid studies were abnormal.  His primary care has not been able to get the results of these labs thus he states his primary care did further lab testing which he is set to get the results on October 11.  He does not know what testing was done.  He denies any symptoms of thyroid disease but states that he thinks they said that his thyroid was over functioning.   He also has been on oxycodone for pain.  He states he was seeing a pain specialist for period of time but then stopped sometime over the summer and thus was not on oxycodone but has recently been back on it for the past 5 to 6 months.  He does not feel that the itching was last improved when he  was not taking the oxycodone.  He also states he also has been having issues with his asthma.  He has a history of asthma diagnosed in childhood but states that it had been quiet for a long time until recently over the past several months.  He states he has been having mostly shortness of breath and some wheezing.  He is using albuterol several times a week.  He is on Flovent 2 puffs but he states he has been taking with the albuterol does not taking it every day.  Denies hospitalization for asthma.  No history of eczema, food allergy or environmental allergy.     Review of systems: Review of Systems  Constitutional: Negative for chills, fever, malaise/fatigue and weight loss.  HENT: Negative for congestion, ear discharge, ear pain, nosebleeds and sore throat.   Eyes: Negative for pain, discharge and redness.  Respiratory: Positive for shortness of breath and wheezing.   Cardiovascular: Negative for chest pain.  Gastrointestinal: Negative for abdominal pain, constipation, diarrhea, heartburn, nausea and vomiting.  Musculoskeletal: Negative for joint pain.  Skin: Positive for itching. Negative for rash.  Neurological: Negative for headaches.    All other systems negative unless noted above in HPI  Past medical history: Past Medical History:  Diagnosis Date  . Asthma   . Bronchitis   .  Endometriosis   . Hypertension   . Migraine     Past surgical history: Past Surgical History:  Procedure Laterality Date  . ANKLE SURGERY    . MASTECTOMY Bilateral 2017   male to male     Family history:  Family History  Problem Relation Age of Onset  . Cancer Mother        lung  . Hypertension Maternal Grandmother   . Diabetes Maternal Grandmother     Social history: He lives in a home with wife and kids with carpeting with gas and electric heating and central and window cooling.  No pets in the home but there are cats outside the home.  No concern for water damage, mildew or  roaches in the home.  He works as a Conservation officer, [employer].  Denies a smoking history.  Medication List: Allergies as of 07/27/2018      Reactions   Frovatriptan Other (See Comments)   Numbness in both legs   Sulfa Antibiotics Other (See Comments)   Makes patients legs numb    Tylenol [acetaminophen] Rash      Medication List        Accurate as of 07/27/18  1:19 PM. Always use your most recent med list.          albuterol 108 (90 Base) MCG/ACT inhaler Commonly known as:  PROVENTIL HFA;VENTOLIN HFA Inhale 2 puffs into the lungs every 6 (six) hours as needed for wheezing or shortness of breath.   alprazolam 2 MG tablet Commonly known as:  XANAX Take by mouth.   cyclobenzaprine 10 MG tablet Commonly known as:  FLEXERIL Take by mouth.   diclofenac sodium 1 % Gel Commonly known as:  VOLTAREN APPLY 4 GRAMS 4 TIMES A DAY AS NEEDED FOR PAIN   FLOVENT HFA 220 MCG/ACT inhaler Generic drug:  fluticasone TAKE 2 PUFFS BY MOUTH TWICE A DAY   hydrochlorothiazide 25 MG tablet Commonly known as:  HYDRODIURIL hydrochlorothiazide 25 mg tablet  Take 1 tablet every day by oral route.   hydrOXYzine 25 MG tablet Commonly known as:  ATARAX/VISTARIL TAKE 1 TABLET BY MOUTH TWICE A DAY AS NEEDED ITCHING   ibuprofen 800 MG tablet Commonly known as:  ADVIL,MOTRIN Take 800-1,600 mg by mouth every 8 (eight) hours as needed for headache, mild pain or moderate pain (migraine).   lisinopril 20 MG tablet Commonly known as:  PRINIVIL,ZESTRIL Take 1 tablet (20 mg total) by mouth daily.   loratadine 10 MG tablet Commonly known as:  CLARITIN loratadine 10 mg tablet  Take 1 tablet every day by oral route for 30 days.   ondansetron 4 MG disintegrating tablet Commonly known as:  ZOFRAN-ODT ondansetron 4 mg disintegrating tablet  Take 1 tablet 3 times a day by oral route as needed.   Oxycodone HCl 10 MG Tabs Take 10 mg by mouth every 8 (eight) hours.   TESTOSTERONE IM Inject 100 mg into the muscle See  admin instructions. Every 10 days   tiZANidine 4 MG tablet Commonly known as:  ZANAFLEX Take 4 mg by mouth every 6 (six) hours as needed for muscle spasms.   traZODone 100 MG tablet Commonly known as:  DESYREL Take 100 mg by mouth at bedtime as needed for sleep.       Known medication allergies: Allergies  Allergen Reactions  . Frovatriptan Other (See Comments)    Numbness in both legs  . Sulfa Antibiotics Other (See Comments)    Makes patients legs numb   . Tylenol [  Acetaminophen] Rash     Physical examination: Blood pressure 122/68, pulse 92, temperature 98.6 F (37 C), temperature source Oral, resp. rate 16, height 5\' 3"  (1.6 m), weight 163 lb 12.8 oz (74.3 kg), SpO2 98 %.  General: Alert, interactive, in no acute distress. HEENT: PERRLA, TMs pearly gray, turbinates non-edematous without discharge, post-pharynx non erythematous. Neck: Supple without lymphadenopathy. Lungs: Clear to auscultation without wheezing, rhonchi or rales. {no increased work of breathing. CV: Normal S1, S2 without murmurs. Abdomen: Nondistended, nontender. Skin: Multiple tattoos on bilateral arms. Extremities:  No clubbing, cyanosis or edema. Neuro:   Grossly intact.  Diagnositics/Labs:  Spirometry: FEV1: 2.39L 84%, FVC: 2.89L 84%, ratio consistent with Nonobstructive pattern  Allergy testing: Unable to perform due to recent antihistamine use  Assessment and plan:   Chronic itch   - itch for years that's worsened since August   - at this time etiology of itch is unknown.  Itch can be caused by a variety of different triggers including illness/infection, foods, medications, stings, exercise, pressure, vibrations, extremes of temperature to name a few however there may be no identifiable trigger.  Opiate medication use (oxycodone) can cause itch as a side effect.     - will obtain environmental panel and basic food panel at this time and await to here from your PCP regarding the labs you  recently had done.   Once we get your recent labs done if I need to add additional labs we will let you know.     - recommend taking Zyrtec, Xyzal or Allegra 1 tablet twice a day with Pepcid 20mg  1 tablet twice a day   -Consider trial of Eucrisa topically to help with his control  Asthma   - take your Flovent 2 puffs twice a day everyday at this time   - have access to albuterol inhaler 2 puffs every 4-6 hours as needed for cough/wheeze/shortness of breath/chest tightness.  May use 15-20 minutes prior to activity.   Monitor frequency of use.    Asthma control goals:   Full participation in all desired activities (may need albuterol before activity)  Albuterol use two time or less a week on average (not counting use with activity)  Cough interfering with sleep two time or less a month  Oral steroids no more than once a year  No hospitalizations  Follow-up 2-3 months or sooner if needed  I appreciate the opportunity to take part in Seibert's care. Please do not hesitate to contact me with questions.  Sincerely,   Margo Aye, MD Allergy/Immunology [facility]

## 2018-07-27 NOTE — Telephone Encounter (Signed)
Spoke with nurse patient labs will be faxed over to office. Patient had TSH, T3 and T4 as well as T3 uptake.

## 2018-07-30 LAB — ALLERGEN PROFILE, BASIC FOOD
Beef IgE: <0.1 kU/L
Egg, Whole IgE: <0.1 kU/L
Food Mix (Seafoods) IgE: NEGATIVE
MILK IGE: 0.14 kU/L — AB
Peanut IgE: <0.1 kU/L
Pork IgE: <0.1 kU/L
Soybean IgE: <0.1 kU/L

## 2018-07-30 LAB — ALLERGENS W/TOTAL IGE AREA 2
Alternaria Alternata IgE: <0.1 kU/L
Aspergillus Fumigatus IgE: <0.1 kU/L
Bermuda Grass IgE: <0.1 kU/L
Cat Dander IgE: 0.12 kU/L — AB
Cedar, Mountain IgE: <0.1 kU/L
Common Silver Birch IgE: 0.6 kU/L — AB
D Farinae IgE: <0.1 kU/L
D Pteronyssinus IgE: <0.1 kU/L
Dog Dander IgE: 0.16 kU/L — AB
IGE (IMMUNOGLOBULIN E), SERUM: 30 [IU]/mL (ref 6–495)
Mouse Urine IgE: <0.1 kU/L
Oak, White IgE: <0.1 kU/L
Pigweed, Rough IgE: <0.1 kU/L
Ragweed, Short IgE: 1.12 kU/L — AB
T001-IGE MAPLE/BOX ELDER: 0.19 kU/L — AB
Timothy Grass IgE: <0.1 kU/L
White Mulberry IgE: <0.1 kU/L

## 2018-08-08 MED ORDER — FAMOTIDINE 20 MG PO TABS: 20.0000 mg | ORAL_TABLET | Freq: Two times a day (BID) | ORAL | 1 refills | Status: DC

## 2018-08-08 NOTE — Addendum Note (Signed)
Addended by: Osa Craver on: 08/08/2018 04:27 PM   Modules accepted: Orders

## 2018-08-12 ENCOUNTER — Encounter: Payer: Self-pay | Admitting: Endocrinology

## 2018-08-12 ENCOUNTER — Ambulatory Visit (INDEPENDENT_AMBULATORY_CARE_PROVIDER_SITE_OTHER): Payer: Medicare Other | Admitting: Endocrinology

## 2018-08-12 DIAGNOSIS — I10 Essential (primary) hypertension: Secondary | ICD-10-CM | POA: Diagnosis not present

## 2018-08-12 DIAGNOSIS — E0590 Thyrotoxicosis, unspecified without thyrotoxic crisis or storm: Secondary | ICD-10-CM | POA: Diagnosis not present

## 2018-08-12 DIAGNOSIS — G43809 Other migraine, not intractable, without status migrainosus: Secondary | ICD-10-CM | POA: Diagnosis not present

## 2018-08-12 DIAGNOSIS — F640 Transsexualism: Secondary | ICD-10-CM | POA: Diagnosis not present

## 2018-08-12 DIAGNOSIS — J45909 Unspecified asthma, uncomplicated: Secondary | ICD-10-CM

## 2018-08-12 DIAGNOSIS — Z789 Other specified health status: Secondary | ICD-10-CM | POA: Insufficient documentation

## 2018-08-12 DIAGNOSIS — G43909 Migraine, unspecified, not intractable, without status migrainosus: Secondary | ICD-10-CM | POA: Insufficient documentation

## 2018-08-12 LAB — CBC WITH DIFFERENTIAL/PLATELET
BASOS ABS: 0 10*3/uL (ref 0.0–0.1)
Basophils Relative: 1 % (ref 0.0–3.0)
EOS PCT: 0.7 % (ref 0.0–5.0)
Eosinophils Absolute: 0 10*3/uL (ref 0.0–0.7)
HEMATOCRIT: 38.2 % — AB (ref 39.0–52.0)
Hemoglobin: 12.8 g/dL — ABNORMAL LOW (ref 13.0–17.0)
LYMPHS PCT: 61.4 % — AB (ref 12.0–46.0)
Lymphs Abs: 2.1 10*3/uL (ref 0.7–4.0)
MCHC: 33.5 g/dL (ref 30.0–36.0)
MCV: 78.3 fl (ref 78.0–100.0)
MONOS PCT: 10.6 % (ref 3.0–12.0)
Monocytes Absolute: 0.4 10*3/uL (ref 0.1–1.0)
NEUTROS ABS: 0.9 10*3/uL — AB (ref 1.4–7.7)
Neutrophils Relative %: 26.3 % — ABNORMAL LOW (ref 43.0–77.0)
PLATELETS: 254 10*3/uL (ref 150.0–400.0)
RBC: 4.89 Mil/uL (ref 4.22–5.81)
RDW: 13.9 % (ref 11.5–15.5)
WBC: 3.4 10*3/uL — ABNORMAL LOW (ref 4.0–10.5)

## 2018-08-12 LAB — T4, FREE: Free T4: 4.39 ng/dL — ABNORMAL HIGH (ref 0.60–1.60)

## 2018-08-12 LAB — TSH

## 2018-08-12 MED ORDER — METHIMAZOLE 10 MG PO TABS: 10.0000 mg | ORAL_TABLET | Freq: Two times a day (BID) | ORAL | 2 refills | Status: DC

## 2018-08-12 NOTE — Progress Notes (Signed)
Subjective:    Patient ID: Anthony Rowe, adult    DOB: 1981/01/10, 37 y.o.   MRN: [id number]  HPI Pt is referred by Dr Concepcion Elk, for transgender state (F to M).  Pt reports he first suspected the transgender state at age 70.  He had puberty at the age of 53.  He had breast reduction surgery in 2017.  He last went to counseling in 2014. He has been on testosterone since 2015.  Last dose was 1 week ago.  He has no biological children.  He has no h/o DVT, dyslipidemia, liver disease, kidney disease, sleep apnea, heart disease, CVA, acne, diabetes, smoking, epilepsy, violent behavior, gallbladder disease, blood disorders, osteoporosis, or cancer.  Pt is also referred for hyperthyroidism.  Pt reports he was dx'ed with hyperthyroidism 6 weeks ago.  He has never been on therapy for this.  He has never had XRT to the anterior neck, or thyroid surgery. He does not consume kelp or any other non-prescribed thyroid medication.  He has never been on amiodarone.  He has moderate tremor of the hands, and assoc weight loss.   Past Medical History:  Diagnosis Date  . Asthma   . Bronchitis   . Endometriosis   . Hypertension   . Migraine     Past Surgical History:  Procedure Laterality Date  . ANKLE SURGERY    . MASTECTOMY Bilateral 2017   male to male     Social History   Socioeconomic History  . Marital status: Married    Spouse name: Not on file  . Number of children: Not on file  . Years of education: Not on file  . Highest education level: Not on file  Occupational History  . Not on file  Social Needs  . Financial resource strain: Not on file  . Food insecurity:    Worry: Not on file    Inability: Not on file  . Transportation needs:    Medical: Not on file    Non-medical: Not on file  Tobacco Use  . Smoking status: Never Smoker  . Smokeless tobacco: Never Used  Substance and Sexual Activity  . Alcohol use: No  . Drug use: No  . Sexual activity: Not Currently  Lifestyle  .  Physical activity:    Days per week: Not on file    Minutes per session: Not on file  . Stress: Not on file  Relationships  . Social connections:    Talks on phone: Not on file    Gets together: Not on file    Attends religious service: Not on file    Active member of club or organization: Not on file    Attends meetings of clubs or organizations: Not on file    Relationship status: Not on file  . Intimate partner violence:    Fear of current or ex partner: Not on file    Emotionally abused: Not on file    Physically abused: Not on file    Forced sexual activity: Not on file  Other Topics Concern  . Not on file  Social History Narrative  . Not on file    Current Outpatient Medications on File Prior to Visit  Medication Sig Dispense Refill  . albuterol (PROVENTIL HFA;VENTOLIN HFA) 108 (90 Base) MCG/ACT inhaler 2 Puff every 4-6 hours as needed 1 Inhaler 1  . alprazolam (XANAX) 2 MG tablet Take by mouth.    . cyclobenzaprine (FLEXERIL) 10 MG tablet Take by mouth.    Marland Kitchen  diclofenac sodium (VOLTAREN) 1 % GEL APPLY 4 GRAMS 4 TIMES A DAY AS NEEDED FOR PAIN  5  . famotidine (PEPCID) 20 MG tablet Take 1 tablet (20 mg total) by mouth 2 (two) times daily. 180 tablet 1  . FLOVENT HFA 220 MCG/ACT inhaler TAKE 2 PUFFS BY MOUTH TWICE A DAY  5  . hydrochlorothiazide (HYDRODIURIL) 25 MG tablet hydrochlorothiazide 25 mg tablet  Take 1 tablet every day by oral route.    . hydrOXYzine (ATARAX/VISTARIL) 25 MG tablet TAKE 1 TABLET BY MOUTH TWICE A DAY AS NEEDED ITCHING  2  . ibuprofen (ADVIL,MOTRIN) 800 MG tablet Take 800-1,600 mg by mouth every 8 (eight) hours as needed for headache, mild pain or moderate pain (migraine).     Marland Kitchen levocetirizine (XYZAL) 5 MG tablet Take 1 tablet (5 mg total) by mouth 2 (two) times daily. 60 tablet 5  . lisinopril (PRINIVIL,ZESTRIL) 20 MG tablet Take 1 tablet (20 mg total) by mouth daily. 30 tablet 1  . loratadine (CLARITIN) 10 MG tablet loratadine 10 mg tablet  Take 1  tablet every day by oral route for 30 days.    . ondansetron (ZOFRAN-ODT) 4 MG disintegrating tablet ondansetron 4 mg disintegrating tablet  Take 1 tablet 3 times a day by oral route as needed.    . Oxycodone HCl 10 MG TABS Take 10 mg by mouth every 8 (eight) hours.  0  . TESTOSTERONE IM Inject 100 mg into the muscle See admin instructions. Every 10 days    . tiZANidine (ZANAFLEX) 4 MG tablet Take 4 mg by mouth every 6 (six) hours as needed for muscle spasms.    . traZODone (DESYREL) 100 MG tablet Take 100 mg by mouth at bedtime as needed for sleep.      No current facility-administered medications on file prior to visit.     Allergies  Allergen Reactions  . Frovatriptan Other (See Comments)    Numbness in both legs  . Sulfa Antibiotics Other (See Comments)    Makes patients legs numb   . Tylenol [Acetaminophen] Rash    Family History  Problem Relation Age of Onset  . Cancer Mother        lung  . Hypertension Maternal Grandmother   . Diabetes Maternal Grandmother   . Thyroid disease Neg Hx     BP 118/78 (BP Location: Left Arm, Patient Position: Sitting)   Pulse (!) 110   Ht 5\' 3"  (1.6 m)   Wt 158 lb (71.7 kg)   SpO2 99%   BMI 27.99 kg/m   Review of Systems denies fever, hoarseness, diplopia, diarrhea, polyuria, muscle weakness, edema, excessive diaphoresis, heat intolerance, easy bruising, and rhinorrhea. He has intermitt headache, anxiety, wheezing, and palpitations.     Objective:   Physical Exam VS: see vs page GEN: no distress HEAD: head: no deformity eyes: no periorbital swelling; moderate bilat proptosis external nose and ears are normal mouth: no lesion seen NECK: thyroid is approx twice normal size.  No palpable nodule CHEST WALL: no deformity.   LUNGS:  Clear to auscultation BREASTS: Old healed surgical scars.  No palpable breast tissue CV: reg rate and rhythm, no murmur ABD: abdomen is soft, nontender.  no hepatosplenomegaly.  not distended.  no  hernia GENITALIA:  Normal external male, except for 4 cm phallus.   MUSCULOSKELETAL: muscle bulk and strength are grossly normal.  no obvious joint swelling.  gait is normal and steady EXTEMITIES: no deformity.  no ulcer on the feet.  feet are of normal color and temp.  no edema PULSES: dorsalis pedis intact bilat.  no carotid bruit NEURO:  cn 2-12 grossly intact.   readily moves all 4's.  sensation is intact to touch on the feet SKIN:  Normal texture and temperature.  No rash or suspicious lesion is visible.  Minimal body hair. NODES:  None palpable at the neck PSYCH: alert, well-oriented.  Does not appear anxious nor depressed.  outside test results are reviewed: TSH=0.01  I have reviewed outside records, and summarized: There were 2 referrals: 1 for transgender hormonal rx, and 1 for hypewrthyroidism.  He presented with hyperthyroidism, and was ref here.   Testosterone was apparently reduced from 200 to 100 mg every 10 days, due to polycythemia and high testosterone level      Assessment & Plan:  Transgender state, new to me Hyperthyroidism, prob due to Grave's Dz.  Asthma: in this setting, I will hold off on b-blocker.    Patient Instructions  I have sent a prescription to your pharmacy, to slow the thyroid. If ever you have fever while taking methimazole, stop it and call us, even if the reason is obvious, because of the risk of a rare side-effect. When the thyroid level is better, you can consider other options. Please come back for a follow-up appointment in 3-4 weeks      Hyperthyroidism Hyperthyroidism is when the thyroid is too active (overactive). Your thyroid is a large gland that is located in your neck. The thyroid helps to control how your body uses food (metabolism). When your thyroid is overactive, it produces too much of a hormone called thyroxine. What are the causes? Causes of hyperthyroidism may include:  Graves disease. This is when your immune system  attacks the thyroid gland. This is the most common cause.  Inflammation of the thyroid gland.  Tumor in the thyroid gland or somewhere else.  Excessive use of thyroid medicines, including: ? Prescription thyroid supplement. ? Herbal supplements that mimic thyroid hormones.  Solid or fluid-filled lumps within your thyroid gland (thyroid nodules).  Excessive ingestion of iodine.  What increases the risk?  Being male.  Having a family history of thyroid conditions. What are the signs or symptoms? Signs and symptoms of hyperthyroidism may include:  Nervousness.  Inability to tolerate heat.  Unexplained weight loss.  Diarrhea.  Change in the texture of hair or skin.  Heart skipping beats or making extra beats.  Rapid heart rate.  Loss of menstruation.  Shaky hands.  Fatigue.  Restlessness.  Increased appetite.  Sleep problems.  Enlarged thyroid gland or nodules.  How is this diagnosed? Diagnosis of hyperthyroidism may include:  Medical history and physical exam.  Blood tests.  Ultrasound tests.  How is this treated? Treatment may include:  Medicines to control your thyroid.  Surgery to remove your thyroid.  Radiation therapy.  Follow these instructions at home:  Take medicines only as directed by your health care provider.  Do not use any tobacco products, including cigarettes, chewing tobacco, or electronic cigarettes. If you need help quitting, ask your health care provider.  Do not exercise or do physical activity until your health care provider approves.  Keep all follow-up appointments as directed by your health care provider. This is important. Contact a health care provider if:  Your symptoms do not get better with treatment.  You have fever.  You are taking thyroid replacement medicine and you: ? Have depression. ? Feel mentally and physically slow. ? Have  weight gain. Get help right away if:  You have decreased alertness  or a change in your awareness.  You have abdominal pain.  You feel dizzy.  You have a rapid heartbeat.  You have an irregular heartbeat. This information is not intended to replace advice given to you by your health care provider. Make sure you discuss any questions you have with your health care provider. Document Released: 10/19/2005 Document Revised: 03/19/2016 Document Reviewed: 03/06/2014 Elsevier Interactive Patient Education  2018 [employer].

## 2018-08-12 NOTE — Patient Instructions (Addendum)
I have sent a prescription to your pharmacy, to slow the thyroid. If ever you have fever while taking methimazole, stop it and call us, even if the reason is obvious, because of the risk of a rare side-effect. When the thyroid level is better, you can consider other options. Please come back for a follow-up appointment in 3-4 weeks      Hyperthyroidism Hyperthyroidism is when the thyroid is too active (overactive). Your thyroid is a large gland that is located in your neck. The thyroid helps to control how your body uses food (metabolism). When your thyroid is overactive, it produces too much of a hormone called thyroxine. What are the causes? Causes of hyperthyroidism may include:  Graves disease. This is when your immune system attacks the thyroid gland. This is the most common cause.  Inflammation of the thyroid gland.  Tumor in the thyroid gland or somewhere else.  Excessive use of thyroid medicines, including: ? Prescription thyroid supplement. ? Herbal supplements that mimic thyroid hormones.  Solid or fluid-filled lumps within your thyroid gland (thyroid nodules).  Excessive ingestion of iodine.  What increases the risk?  Being male.  Having a family history of thyroid conditions. What are the signs or symptoms? Signs and symptoms of hyperthyroidism may include:  Nervousness.  Inability to tolerate heat.  Unexplained weight loss.  Diarrhea.  Change in the texture of hair or skin.  Heart skipping beats or making extra beats.  Rapid heart rate.  Loss of menstruation.  Shaky hands.  Fatigue.  Restlessness.  Increased appetite.  Sleep problems.  Enlarged thyroid gland or nodules.  How is this diagnosed? Diagnosis of hyperthyroidism may include:  Medical history and physical exam.  Blood tests.  Ultrasound tests.  How is this treated? Treatment may include:  Medicines to control your thyroid.  Surgery to remove your  thyroid.  Radiation therapy.  Follow these instructions at home:  Take medicines only as directed by your health care provider.  Do not use any tobacco products, including cigarettes, chewing tobacco, or electronic cigarettes. If you need help quitting, ask your health care provider.  Do not exercise or do physical activity until your health care provider approves.  Keep all follow-up appointments as directed by your health care provider. This is important. Contact a health care provider if:  Your symptoms do not get better with treatment.  You have fever.  You are taking thyroid replacement medicine and you: ? Have depression. ? Feel mentally and physically slow. ? Have weight gain. Get help right away if:  You have decreased alertness or a change in your awareness.  You have abdominal pain.  You feel dizzy.  You have a rapid heartbeat.  You have an irregular heartbeat. This information is not intended to replace advice given to you by your health care provider. Make sure you discuss any questions you have with your health care provider. Document Released: 10/19/2005 Document Revised: 03/19/2016 Document Reviewed: 03/06/2014 Elsevier Interactive Patient Education  2018 [employer].

## 2018-08-14 ENCOUNTER — Other Ambulatory Visit: Payer: Self-pay | Admitting: Endocrinology

## 2018-08-14 DIAGNOSIS — F640 Transsexualism: Secondary | ICD-10-CM

## 2018-08-14 DIAGNOSIS — Z789 Other specified health status: Secondary | ICD-10-CM

## 2018-08-14 LAB — TESTOSTERONE,FREE AND TOTAL
Testosterone, Free: 25.6 pg/mL — ABNORMAL HIGH (ref 8.7–25.1)
Testosterone: >1500 ng/dL — ABNORMAL HIGH (ref 264–916)

## 2018-08-16 ENCOUNTER — Telehealth: Payer: Self-pay | Admitting: Endocrinology

## 2018-08-16 MED ORDER — TESTOSTERONE CYPIONATE 100 MG/ML IM SOLN: INTRAMUSCULAR | 0 refills | Status: DC

## 2018-08-16 NOTE — Telephone Encounter (Signed)
I checked [facility]. I have sent a prescription to your pharmacy.

## 2018-08-16 NOTE — Telephone Encounter (Signed)
Went over pt recent labs and pt needs a refill on testosterone

## 2018-08-16 NOTE — Telephone Encounter (Signed)
Patient returning call. Does not know why she was called. Please call patient at ph# [phone].

## 2018-08-17 ENCOUNTER — Other Ambulatory Visit: Payer: Self-pay

## 2018-08-17 ENCOUNTER — Telehealth: Payer: Self-pay

## 2018-08-17 ENCOUNTER — Other Ambulatory Visit: Payer: Self-pay | Admitting: Endocrinology

## 2018-08-17 MED ORDER — TESTOSTERONE CYPIONATE 100 MG/ML IM SOLN: INTRAMUSCULAR | 0 refills | Status: DC

## 2018-08-17 NOTE — Telephone Encounter (Signed)
-----   Message from Romero Belling, MD sent at 08/14/2018  6:19 PM EDT ----- please call patient: Testosterone is high.  Please reduce the testosterone to 50 mg every 10 days. Please redo the blood test in approx 6 weeks

## 2018-08-17 NOTE — Telephone Encounter (Signed)
Called pt to inform about Testosterone results, change to Rx'd medication and to repeat labs in 6 weeks. Verbalized acceptance and understanding of all instructions.

## 2018-08-17 NOTE — Telephone Encounter (Signed)
Received notification from [facility] stating testosterone cypionate 100mg /mL inj on back order with no release date. Spoke with Dr. Everardo All who advised me to speak with pt about transferring Rx to an alternate pharmacy. Spoke with pt who requested Rx be sent to [facility]. Faxed signed Rx at pt request

## 2018-09-02 ENCOUNTER — Ambulatory Visit: Payer: Medicare Other | Admitting: Endocrinology

## 2018-09-16 ENCOUNTER — Encounter: Payer: Self-pay | Admitting: Endocrinology

## 2018-09-16 ENCOUNTER — Ambulatory Visit (INDEPENDENT_AMBULATORY_CARE_PROVIDER_SITE_OTHER): Payer: Medicare Other | Admitting: Endocrinology

## 2018-09-16 VITALS — BP 130/80 | HR 97 | Ht 63.0 in | Wt 168.2 lb

## 2018-09-16 DIAGNOSIS — Z789 Other specified health status: Secondary | ICD-10-CM

## 2018-09-16 DIAGNOSIS — F640 Transsexualism: Secondary | ICD-10-CM | POA: Diagnosis not present

## 2018-09-16 DIAGNOSIS — E0590 Thyrotoxicosis, unspecified without thyrotoxic crisis or storm: Secondary | ICD-10-CM | POA: Diagnosis not present

## 2018-09-16 LAB — CBC WITH DIFFERENTIAL/PLATELET
BASOS ABS: 0 10*3/uL (ref 0.0–0.1)
BASOS PCT: 0.6 % (ref 0.0–3.0)
EOS PCT: 1.2 % (ref 0.0–5.0)
Eosinophils Absolute: 0.1 10*3/uL (ref 0.0–0.7)
HEMATOCRIT: 42 % (ref 39.0–52.0)
Hemoglobin: 14.1 g/dL (ref 13.0–17.0)
LYMPHS ABS: 1.5 10*3/uL (ref 0.7–4.0)
LYMPHS PCT: 29.7 % (ref 12.0–46.0)
MCHC: 33.7 g/dL (ref 30.0–36.0)
MCV: 81.1 fl (ref 78.0–100.0)
MONOS PCT: 7 % (ref 3.0–12.0)
Monocytes Absolute: 0.3 10*3/uL (ref 0.1–1.0)
NEUTROS ABS: 3.1 10*3/uL (ref 1.4–7.7)
NEUTROS PCT: 61.5 % (ref 43.0–77.0)
PLATELETS: 302 10*3/uL (ref 150.0–400.0)
RBC: 5.18 Mil/uL (ref 4.22–5.81)
RDW: 15.4 % (ref 11.5–15.5)
WBC: 5 10*3/uL (ref 4.0–10.5)

## 2018-09-16 LAB — TSH: TSH: <0.01 u[IU]/mL — ABNORMAL LOW (ref 0.35–4.50)

## 2018-09-16 LAB — T4, FREE: Free T4: 1.51 ng/dL (ref 0.60–1.60)

## 2018-09-16 NOTE — Patient Instructions (Addendum)
blood tests are requested for you today.  We'll let you know about the results. If ever you have fever while taking methimazole, stop it and call us, even if the reason is obvious, because of the risk of a rare side-effect. When the thyroid level is better, you can consider other options.   You could take the radioactive iodine pill.  It works like this: We would first check a thyroid "scan" (a special, but easy and painless type of thyroid x ray).  you go to the x-ray department of the hospital to swallow a pill, which contains a miniscule amount of radiation.  You will not notice any symptoms from this.  You will go back to the x-ray department the next day, to lie down in front of a camera.  The results of this will be sent to me.   Based on the results, i hope to order for you a treatment pill of radioactive iodine.  Although it is a larger amount of radiation, you will again notice no symptoms from this.  The pill is gone from your body in a few days (during which you should stay away from other people), but takes several months to work.  Therefore, please return here approximately 6-8 weeks after the treatment.  This treatment has been available for many years, and the only known side-effect is an underactive thyroid.  It is possible that i would eventually prescribe for you a thyroid hormone pill, which is very inexpensive.  You don't have to worry about side-effects of this thyroid hormone pill, because it is the same molecule your thyroid makes.  Please come back for a follow-up appointment in 2 months.

## 2018-09-16 NOTE — Progress Notes (Signed)
Subjective:    Patient ID: Anthony Rowe, adult    DOB: 1981-04-15, 37 y.o.   MRN: [id number]  HPI Pt returns for f/u of transgender state (F to M; he had breast reduction surgery in 2017; e last went to counseling in 2014; he has been on testosterone since 2015; dosage is limited by polycythemia).  Last dose was was 3 weeks ago Pt also has hyperthyroidism (dx'ed 2019; he has never had thyroid imaging; tapazole is chosen as initial rx, due to severity).  Since on the tapazole, pt states he feels no different, and well in general. Past Medical History:  Diagnosis Date  . Asthma   . Bronchitis   . Endometriosis   . Hypertension   . Migraine     Past Surgical History:  Procedure Laterality Date  . ANKLE SURGERY    . MASTECTOMY Bilateral 2017   male to male     Social History   Socioeconomic History  . Marital status: Married    Spouse name: Not on file  . Number of children: Not on file  . Years of education: Not on file  . Highest education level: Not on file  Occupational History  . Not on file  Social Needs  . Financial resource strain: Not on file  . Food insecurity:    Worry: Not on file    Inability: Not on file  . Transportation needs:    Medical: Not on file    Non-medical: Not on file  Tobacco Use  . Smoking status: Never Smoker  . Smokeless tobacco: Never Used  Substance and Sexual Activity  . Alcohol use: No  . Drug use: No  . Sexual activity: Not Currently  Lifestyle  . Physical activity:    Days per week: Not on file    Minutes per session: Not on file  . Stress: Not on file  Relationships  . Social connections:    Talks on phone: Not on file    Gets together: Not on file    Attends religious service: Not on file    Active member of club or organization: Not on file    Attends meetings of clubs or organizations: Not on file    Relationship status: Not on file  . Intimate partner violence:    Fear of current or ex partner: Not on file   Emotionally abused: Not on file    Physically abused: Not on file    Forced sexual activity: Not on file  Other Topics Concern  . Not on file  Social History Narrative  . Not on file    Current Outpatient Medications on File Prior to Visit  Medication Sig Dispense Refill  . albuterol (PROVENTIL HFA;VENTOLIN HFA) 108 (90 Base) MCG/ACT inhaler 2 Puff every 4-6 hours as needed 1 Inhaler 1  . alprazolam (XANAX) 2 MG tablet Take by mouth.    . cyclobenzaprine (FLEXERIL) 10 MG tablet Take by mouth.    . diclofenac sodium (VOLTAREN) 1 % GEL APPLY 4 GRAMS 4 TIMES A DAY AS NEEDED FOR PAIN  5  . famotidine (PEPCID) 20 MG tablet Take 1 tablet (20 mg total) by mouth 2 (two) times daily. 180 tablet 1  . FLOVENT HFA 220 MCG/ACT inhaler TAKE 2 PUFFS BY MOUTH TWICE A DAY  5  . hydrochlorothiazide (HYDRODIURIL) 25 MG tablet hydrochlorothiazide 25 mg tablet  Take 1 tablet every day by oral route.    . hydrOXYzine (ATARAX/VISTARIL) 25 MG tablet TAKE 1 TABLET BY  MOUTH TWICE A DAY AS NEEDED ITCHING  2  . ibuprofen (ADVIL,MOTRIN) 800 MG tablet Take 800-1,600 mg by mouth every 8 (eight) hours as needed for headache, mild pain or moderate pain (migraine).     Marland Kitchen levocetirizine (XYZAL) 5 MG tablet Take 1 tablet (5 mg total) by mouth 2 (two) times daily. 60 tablet 5  . lisinopril (PRINIVIL,ZESTRIL) 20 MG tablet Take 1 tablet (20 mg total) by mouth daily. 30 tablet 1  . loratadine (CLARITIN) 10 MG tablet loratadine 10 mg tablet  Take 1 tablet every day by oral route for 30 days.    . methimazole (TAPAZOLE) 10 MG tablet Take 1 tablet (10 mg total) by mouth 2 (two) times daily. 120 tablet 2  . ondansetron (ZOFRAN-ODT) 4 MG disintegrating tablet ondansetron 4 mg disintegrating tablet  Take 1 tablet 3 times a day by oral route as needed.    . testosterone cypionate (DEPO-TESTOSTERONE) 100 MG/ML injection 50 mg IM, every 10 days 10 mL 0  . tiZANidine (ZANAFLEX) 4 MG tablet Take 4 mg by mouth every 6 (six) hours as  needed for muscle spasms.    . traZODone (DESYREL) 100 MG tablet Take 100 mg by mouth at bedtime as needed for sleep.      No current facility-administered medications on file prior to visit.     Allergies  Allergen Reactions  . Frovatriptan Other (See Comments)    Numbness in both legs  . Sulfa Antibiotics Other (See Comments)    Makes patients legs numb   . Tylenol [Acetaminophen] Rash    Family History  Problem Relation Age of Onset  . Cancer Mother        lung  . Hypertension Maternal Grandmother   . Diabetes Maternal Grandmother   . Thyroid disease Neg Hx     BP 130/80 (BP Location: Right Arm, Patient Position: Sitting, Cuff Size: Normal)   Pulse 97   Ht 5\' 3"  (1.6 m)   Wt 168 lb 3.2 oz (76.3 kg)   SpO2 96%   BMI 29.80 kg/m    Review of Systems Denies fever    Objective:   Physical Exam VITAL SIGNS:  See vs page GENERAL: no distress NECK: thyroid is 3-5 times normal size, diffuse.  No palpable nodule.        Assessment & Plan:  Transgender state: due for labs today Hyperthyroidism: due for recheck  Patient Instructions  blood tests are requested for you today.  We'll let you know about the results. If ever you have fever while taking methimazole, stop it and call us, even if the reason is obvious, because of the risk of a rare side-effect. When the thyroid level is better, you can consider other options.   You could take the radioactive iodine pill.  It works like this: We would first check a thyroid "scan" (a special, but easy and painless type of thyroid x ray).  you go to the x-ray department of the hospital to swallow a pill, which contains a miniscule amount of radiation.  You will not notice any symptoms from this.  You will go back to the x-ray department the next day, to lie down in front of a camera.  The results of this will be sent to me.   Based on the results, i hope to order for you a treatment pill of radioactive iodine.  Although it is a  larger amount of radiation, you will again notice no symptoms from this.  The pill  is gone from your body in a few days (during which you should stay away from other people), but takes several months to work.  Therefore, please return here approximately 6-8 weeks after the treatment.  This treatment has been available for many years, and the only known side-effect is an underactive thyroid.  It is possible that i would eventually prescribe for you a thyroid hormone pill, which is very inexpensive.  You don't have to worry about side-effects of this thyroid hormone pill, because it is the same molecule your thyroid makes.  Please come back for a follow-up appointment in 2 months.

## 2018-09-18 LAB — TESTOSTERONE,FREE AND TOTAL
TESTOSTERONE FREE: 2.8 pg/mL — AB (ref 8.7–25.1)
Testosterone: 188 ng/dL — ABNORMAL LOW (ref 264–916)

## 2018-09-19 ENCOUNTER — Telehealth: Payer: Self-pay | Admitting: Endocrinology

## 2018-09-19 NOTE — Telephone Encounter (Signed)
Patient is returning Ammie's call. Please call patient at ph#[phone].

## 2018-09-19 NOTE — Telephone Encounter (Signed)
Addressed in separate encounter. This encounter closed as duplicate

## 2018-09-23 ENCOUNTER — Encounter (HOSPITAL_COMMUNITY): Payer: Self-pay | Admitting: Emergency Medicine

## 2018-09-23 ENCOUNTER — Ambulatory Visit (HOSPITAL_COMMUNITY)
Admission: EM | Admit: 2018-09-23 | Discharge: 2018-09-23 | Disposition: A | Discharge status: Home / Self Care | Payer: Medicare Other | Attending: Family Medicine | Admitting: Family Medicine

## 2018-09-23 DIAGNOSIS — J3489 Other specified disorders of nose and nasal sinuses: Secondary | ICD-10-CM | POA: Diagnosis not present

## 2018-09-23 LAB — ESTRADIOL, FREE
Estradiol, Free: 0.31 pg/mL
Estradiol: 23 pg/mL

## 2018-09-23 MED ORDER — AMOXICILLIN-POT CLAVULANATE 875-125 MG PO TABS: 1.0000 | ORAL_TABLET | Freq: Two times a day (BID) | ORAL | 0 refills | Status: DC

## 2018-09-23 MED ORDER — FLUTICASONE PROPIONATE 50 MCG/ACT NA SUSP: 2.0000 | Freq: Every day | NASAL | 0 refills | Status: DC

## 2018-09-23 MED ORDER — PREDNISONE 50 MG PO TABS: 50.0000 mg | ORAL_TABLET | Freq: Every day | ORAL | 0 refills | Status: DC

## 2018-09-23 MED ORDER — IPRATROPIUM BROMIDE 0.06 % NA SOLN: 2.0000 | Freq: Four times a day (QID) | NASAL | 0 refills | Status: DC

## 2018-09-23 NOTE — ED Triage Notes (Signed)
Pt c/o ear pain, throat pain, states he saw someone Monday and dx with sinus infection but states hes not feeling better.

## 2018-09-23 NOTE — Discharge Instructions (Signed)
Start prednisone for sinus pressure. Start flonase, atrovent nasal spray for nasal congestion/drainage. You can use over the counter nasal saline rinse such as neti pot for nasal congestion. Keep hydrated, your urine should be clear to pale yellow in color. Tylenol/motrin for fever and pain. Monitor for any worsening of symptoms, chest pain, shortness of breath, wheezing, swelling of the throat, follow up for reevaluation.   If symptoms not improving by 11/26, can fill augmentin for sinus infection.  For sore throat/cough try using a honey-based tea. Use 3 teaspoons of honey with juice squeezed from half lemon. Place shaved pieces of ginger into 1/2-1 cup of water and warm over stove top. Then mix the ingredients and repeat every 4 hours as needed.

## 2018-09-23 NOTE — ED Provider Notes (Signed)
[facility]    CSN: [id number] Arrival date & time: 09/23/18  1842     History   Chief Complaint Chief Complaint  Patient presents with  . URI    HPI Anthony Rowe is a 37 y.o. adult.   37 year old male to male comes in for 1 week history of URI symptoms.  States saw a provider 5 days ago, and was given samples of medicine without relief.  He denies any of the medicine being antibiotics.  States has had ear pain, sore throat, sinus pressure.  Denies obvious rhinorrhea, nasal congestion.  Has been sneezing without cough.  Hot and cold chills.  States has back pain at baseline, which has been worsening since URI symptoms started.  Denies any urinary symptoms such as frequency, dysuria, hematuria.  Never smoker.     Past Medical History:  Diagnosis Date  . Asthma   . Bronchitis   . Endometriosis   . Hypertension   . Migraine     Patient Active Problem List   Diagnosis Date Noted  . Migraine 08/12/2018  . HTN (hypertension) 08/12/2018  . Asthma 08/12/2018  . Male-to-male transgender person 08/12/2018  . Hyperthyroidism 08/12/2018  . Left ankle pain 09/25/2013  . Hypovitaminosis D 09/25/2013    Past Surgical History:  Procedure Laterality Date  . ANKLE SURGERY    . MASTECTOMY Bilateral 2017   male to male        Home Medications    Prior to Admission medications   Medication Sig Start Date End Date Taking? Authorizing Provider  albuterol (PROVENTIL HFA;VENTOLIN HFA) 108 (90 Base) MCG/ACT inhaler 2 Puff every 4-6 hours as needed 07/27/18   Marcelyn Bruins, MD  alprazolam Prudy Feeler) 2 MG tablet Take by mouth.    [provider]  amoxicillin-clavulanate (AUGMENTIN) 875-125 MG tablet Take 1 tablet by mouth every 12 (twelve) hours. Fill 11/26 09/27/18   Belinda Fisher, PA-C  cyclobenzaprine (FLEXERIL) 10 MG tablet Take by mouth. 01/25/13   [provider]  diclofenac sodium (VOLTAREN) 1 % GEL APPLY 4 GRAMS 4 TIMES A DAY AS  NEEDED FOR PAIN 07/08/18   [provider]  famotidine (PEPCID) 20 MG tablet Take 1 tablet (20 mg total) by mouth 2 (two) times daily. 08/08/18   Marcelyn Bruins, MD  FLOVENT HFA 220 MCG/ACT inhaler TAKE 2 PUFFS BY MOUTH TWICE A DAY 06/28/18   [provider]  fluticasone (FLONASE) 50 MCG/ACT nasal spray Place 2 sprays into both nostrils daily. 09/23/18   Cathie Hoops, Amy V, PA-C  hydrochlorothiazide (HYDRODIURIL) 25 MG tablet hydrochlorothiazide 25 mg tablet  Take 1 tablet every day by oral route.    [provider]  hydrOXYzine (ATARAX/VISTARIL) 25 MG tablet TAKE 1 TABLET BY MOUTH TWICE A DAY AS NEEDED ITCHING 06/09/18   [provider]  ibuprofen (ADVIL,MOTRIN) 800 MG tablet Take 800-1,600 mg by mouth every 8 (eight) hours as needed for headache, mild pain or moderate pain (migraine).     [provider]  ipratropium (ATROVENT) 0.06 % nasal spray Place 2 sprays into both nostrils 4 (four) times daily. 09/23/18   Cathie Hoops, Amy V, PA-C  levocetirizine (XYZAL) 5 MG tablet Take 1 tablet (5 mg total) by mouth 2 (two) times daily. 07/27/18   Marcelyn Bruins, MD  lisinopril (PRINIVIL,ZESTRIL) 20 MG tablet Take 1 tablet (20 mg total) by mouth daily. 08/06/15   Renne Crigler, PA-C  loratadine (CLARITIN) 10 MG tablet loratadine 10 mg tablet  Take 1  tablet every day by oral route for 30 days.    [provider]  methimazole (TAPAZOLE) 10 MG tablet Take 1 tablet (10 mg total) by mouth 2 (two) times daily. 08/12/18   Romero BellingEllison, Sean, MD  ondansetron (ZOFRAN-ODT) 4 MG disintegrating tablet ondansetron 4 mg disintegrating tablet  Take 1 tablet 3 times a day by oral route as needed.    [provider]  predniSONE (DELTASONE) 50 MG tablet Take 1 tablet (50 mg total) by mouth daily. 09/23/18   Cathie HoopsYu, Amy V, PA-C  testosterone cypionate (DEPO-TESTOSTERONE) 100 MG/ML injection 50 mg IM, every 10 days 08/17/18   Romero BellingEllison, Sean, MD  tiZANidine (ZANAFLEX) 4 MG  tablet Take 4 mg by mouth every 6 (six) hours as needed for muscle spasms.    [provider]  traZODone (DESYREL) 100 MG tablet Take 100 mg by mouth at bedtime as needed for sleep.     [provider]    Family History Family History  Problem Relation Age of Onset  . Cancer Mother        lung  . Hypertension Maternal Grandmother   . Diabetes Maternal Grandmother   . Thyroid disease Neg Hx     Social History Social History   Tobacco Use  . Smoking status: Never Smoker  . Smokeless tobacco: Never Used  Substance Use Topics  . Alcohol use: No  . Drug use: No     Allergies   Frovatriptan and Sulfa antibiotics   Review of Systems Review of Systems  Reason unable to perform ROS: See HPI as above.     Physical Exam Triage Vital Signs ED Triage Vitals [09/23/18 1907]  Enc Vitals Group     BP (!) 149/85     Pulse Rate 100     Resp 18     Temp 100 F (37.8 C)     Temp Source Temporal     SpO2 99 %     Weight      Height      Head Circumference      Peak Flow      Pain Score 10     Pain Loc      Pain Edu?      Excl. in GC?    No data found.  Updated Vital Signs BP (!) 149/85   Pulse 100   Temp 100 F (37.8 C) (Temporal)   Resp 18   SpO2 99%   Physical Exam  Constitutional: He is oriented to person, place, and time. He appears well-developed and well-nourished. No distress.  HENT:  Head: Normocephalic and atraumatic.  Right Ear: Tympanic membrane, external ear and ear canal normal. Tympanic membrane is not erythematous and not bulging.  Left Ear: Tympanic membrane, external ear and ear canal normal. Tympanic membrane is not erythematous and not bulging.  Nose: Right sinus exhibits frontal sinus tenderness. Right sinus exhibits no maxillary sinus tenderness. Left sinus exhibits frontal sinus tenderness. Left sinus exhibits no maxillary sinus tenderness.  Mouth/Throat: Uvula is midline, oropharynx is clear and moist and mucous membranes  are normal.  Eyes: Pupils are equal, round, and reactive to light. Conjunctivae are normal.  Neck: Normal range of motion. Neck supple.  Cardiovascular: Normal rate, regular rhythm and normal heart sounds. Exam reveals no gallop and no friction rub.  No murmur heard. Pulmonary/Chest: Effort normal and breath sounds normal. He has no decreased breath sounds. He has no wheezes. He has no rhonchi. He has no rales.  Musculoskeletal:  Diffuse tenderness to palpation of back. Full ROM.   Lymphadenopathy:    He has no cervical adenopathy.  Neurological: He is alert and oriented to person, place, and time.  Skin: Skin is warm and dry. He is not diaphoretic.  Psychiatric: He has a normal mood and affect. His behavior is normal. Judgment normal.     UC Treatments / Results  Labs (all labs ordered are listed, but only abnormal results are displayed) Labs Reviewed - No data to display  EKG None  Radiology No results found.  Procedures Procedures (including critical care time)  Medications Ordered in UC Medications - No data to display  Initial Impression / Assessment and Plan / UC Course  I have reviewed the triage vital signs and the nursing notes.  Pertinent labs & imaging results that were available during my care of the patient were reviewed by me and considered in my medical decision making (see chart for details).    Discussed with patient history and exam most consistent with viral URI. Symptomatic treatment as needed. Push fluids. Rx of augmentin sent to pharmacy, can take in 3-4 days if symptoms not improving. Return precautions given.   Final Clinical Impressions(s) / UC Diagnoses   Final diagnoses:  Sinus pressure    ED Prescriptions    Medication Sig Dispense Auth. Provider   predniSONE (DELTASONE) 50 MG tablet Take 1 tablet (50 mg total) by mouth daily. 5 tablet Yu, Amy V, PA-C   fluticasone (FLONASE) 50 MCG/ACT nasal spray Place 2 sprays into both nostrils daily. 1  g Yu, Amy V, PA-C   ipratropium (ATROVENT) 0.06 % nasal spray Place 2 sprays into both nostrils 4 (four) times daily. 15 mL Yu, Amy V, PA-C   amoxicillin-clavulanate (AUGMENTIN) 875-125 MG tablet Take 1 tablet by mouth every 12 (twelve) hours. Fill 11/26 14 tablet Threasa Alpha, [place] 09/23/18 1952

## 2018-09-28 ENCOUNTER — Ambulatory Visit: Payer: Medicare Other | Admitting: Allergy

## 2018-10-21 ENCOUNTER — Encounter: Payer: Self-pay | Admitting: Endocrinology

## 2018-10-21 ENCOUNTER — Ambulatory Visit (INDEPENDENT_AMBULATORY_CARE_PROVIDER_SITE_OTHER): Payer: Medicare Other | Admitting: Endocrinology

## 2018-10-21 VITALS — BP 100/80 | HR 89 | Temp 98.9°F | Ht 63.0 in | Wt 172.8 lb

## 2018-10-21 DIAGNOSIS — F640 Transsexualism: Secondary | ICD-10-CM | POA: Diagnosis not present

## 2018-10-21 DIAGNOSIS — E0590 Thyrotoxicosis, unspecified without thyrotoxic crisis or storm: Secondary | ICD-10-CM | POA: Diagnosis not present

## 2018-10-21 DIAGNOSIS — Z789 Other specified health status: Secondary | ICD-10-CM

## 2018-10-21 LAB — T4, FREE: FREE T4: 0.97 ng/dL (ref 0.60–1.60)

## 2018-10-21 LAB — TSH

## 2018-10-21 NOTE — Progress Notes (Signed)
Subjective:    Patient ID: Anthony Rowe, adult    DOB: [birth year], 37 y.o.   MRN: [id number]  HPI Pt is under medical rx for transgender state (F to M; he had breast reduction surgery in 2017; e last went to counseling in 2014; he has been on testosterone since 2015; dosage is limited by polycythemia).   Pt also has hyperthyroidism (dx'ed 2019; he has never had thyroid imaging; tapazole is chosen as initial rx, due to severity).  He takes tapazole as rx'ed.  He reports moderate tremor of the hands, and assoc anxiety.  He requests FMLA based on hyperthyroidism.   Past Medical History:  Diagnosis Date  . Asthma   . Bronchitis   . Endometriosis   . Hypertension   . Migraine     Past Surgical History:  Procedure Laterality Date  . ANKLE SURGERY    . MASTECTOMY Bilateral 2017   male to male     Social History   Socioeconomic History  . Marital status: Married    Spouse name: Not on file  . Number of children: Not on file  . Years of education: Not on file  . Highest education level: Not on file  Occupational History  . Not on file  Social Needs  . Financial resource strain: Not on file  . Food insecurity:    Worry: Not on file    Inability: Not on file  . Transportation needs:    Medical: Not on file    Non-medical: Not on file  Tobacco Use  . Smoking status: Never Smoker  . Smokeless tobacco: Never Used  Substance and Sexual Activity  . Alcohol use: No  . Drug use: No  . Sexual activity: Not Currently  Lifestyle  . Physical activity:    Days per week: Not on file    Minutes per session: Not on file  . Stress: Not on file  Relationships  . Social connections:    Talks on phone: Not on file    Gets together: Not on file    Attends religious service: Not on file    Active member of club or organization: Not on file    Attends meetings of clubs or organizations: Not on file    Relationship status: Not on file  . Intimate partner violence:    Fear of  current or ex partner: Not on file    Emotionally abused: Not on file    Physically abused: Not on file    Forced sexual activity: Not on file  Other Topics Concern  . Not on file  Social History Narrative  . Not on file    Current Outpatient Medications on File Prior to Visit  Medication Sig Dispense Refill  . albuterol (PROVENTIL HFA;VENTOLIN HFA) 108 (90 Base) MCG/ACT inhaler 2 Puff every 4-6 hours as needed 1 Inhaler 1  . alprazolam (XANAX) 2 MG tablet Take by mouth.    . cyclobenzaprine (FLEXERIL) 10 MG tablet Take by mouth.    . diclofenac sodium (VOLTAREN) 1 % GEL APPLY 4 GRAMS 4 TIMES A DAY AS NEEDED FOR PAIN  5  . famotidine (PEPCID) 20 MG tablet Take 1 tablet (20 mg total) by mouth 2 (two) times daily. 180 tablet 1  . FLOVENT HFA 220 MCG/ACT inhaler TAKE 2 PUFFS BY MOUTH TWICE A DAY  5  . fluticasone (FLONASE) 50 MCG/ACT nasal spray Place 2 sprays into both nostrils daily. 1 g 0  . hydrochlorothiazide (HYDRODIURIL) 25 MG tablet  hydrochlorothiazide 25 mg tablet  Take 1 tablet every day by oral route.    . hydrOXYzine (ATARAX/VISTARIL) 25 MG tablet TAKE 1 TABLET BY MOUTH TWICE A DAY AS NEEDED ITCHING  2  . ibuprofen (ADVIL,MOTRIN) 800 MG tablet Take 800-1,600 mg by mouth every 8 (eight) hours as needed for headache, mild pain or moderate pain (migraine).     Marland Kitchen. ipratropium (ATROVENT) 0.06 % nasal spray Place 2 sprays into both nostrils 4 (four) times daily. 15 mL 0  . levocetirizine (XYZAL) 5 MG tablet Take 1 tablet (5 mg total) by mouth 2 (two) times daily. 60 tablet 5  . lisinopril (PRINIVIL,ZESTRIL) 20 MG tablet Take 1 tablet (20 mg total) by mouth daily. 30 tablet 1  . loratadine (CLARITIN) 10 MG tablet loratadine 10 mg tablet  Take 1 tablet every day by oral route for 30 days.    . methimazole (TAPAZOLE) 10 MG tablet Take 1 tablet (10 mg total) by mouth 2 (two) times daily. 120 tablet 2  . ondansetron (ZOFRAN-ODT) 4 MG disintegrating tablet ondansetron 4 mg disintegrating  tablet  Take 1 tablet 3 times a day by oral route as needed.    . predniSONE (DELTASONE) 50 MG tablet Take 1 tablet (50 mg total) by mouth daily. 5 tablet 0  . testosterone cypionate (DEPO-TESTOSTERONE) 100 MG/ML injection 50 mg IM, every 10 days 10 mL 0  . tiZANidine (ZANAFLEX) 4 MG tablet Take 4 mg by mouth every 6 (six) hours as needed for muscle spasms.    . traZODone (DESYREL) 100 MG tablet Take 100 mg by mouth at bedtime as needed for sleep.      No current facility-administered medications on file prior to visit.     Allergies  Allergen Reactions  . Frovatriptan Other (See Comments)    Numbness in both legs  . Sulfa Antibiotics Other (See Comments)    Makes patients legs numb     Family History  Problem Relation Age of Onset  . Cancer Mother        lung  . Hypertension Maternal Grandmother   . Diabetes Maternal Grandmother   . Thyroid disease Neg Hx     BP 100/80 (BP Location: Left Arm, Patient Position: Sitting, Cuff Size: Normal)   Pulse 89   Temp 98.9 F (37.2 C) (Oral)   Ht 5\' 3"  (1.6 m)   Wt 172 lb 12.8 oz (78.4 kg)   BMI 30.61 kg/m    Review of Systems He has fatigue and hair loss.      Objective:   Physical Exam VITAL SIGNS:  See vs page GENERAL: no distress NECK: thyroid is 2-3 times normal size, diffuse.  No palpable nodule.  Skin: not diaphoretic. Neuro: slight tremor of the hands  Lab Results  Component Value Date   TSH <0.01 (L) 10/21/2018   Lab Results  Component Value Date   WBC 5.0 09/16/2018   HGB 14.1 09/16/2018   HCT 42.0 09/16/2018   MCV 81.1 09/16/2018   PLT 302.0 09/16/2018   Lab Results  Component Value Date   TESTOSTERONE 485 10/21/2018      Assessment & Plan:  Hyperthyroidism: improved, but persistent.  transgender state: testosterone level is at goal.  Polycythemia: this limits testosterone dosage.  We'll follow this    Patient Instructions  blood tests are requested for you today.  We'll let you know about the  results.  If ever you have fever while taking methimazole, stop it and call us, even if  the reason is obvious, because of the risk of a rare side-effect.  When the thyroid level is better, you can consider other treatment options.   Please come back for a follow-up appointment in approx 3 weeks.

## 2018-10-21 NOTE — Patient Instructions (Addendum)
blood tests are requested for you today.  We'll let you know about the results.  If ever you have fever while taking methimazole, stop it and call us, even if the reason is obvious, because of the risk of a rare side-effect.  When the thyroid level is better, you can consider other treatment options.   Please come back for a follow-up appointment in approx 3 weeks.

## 2018-10-22 LAB — TESTOSTERONE,FREE AND TOTAL
Testosterone, Free: 7.2 pg/mL — ABNORMAL LOW (ref 8.7–25.1)
Testosterone: 485 ng/dL (ref 264–916)

## 2018-11-15 ENCOUNTER — Telehealth: Payer: Self-pay | Admitting: Endocrinology

## 2018-11-15 NOTE — Telephone Encounter (Signed)
Called pt to reschedule appt so we can complete medical clearance. LVM requesting returned call.

## 2018-11-15 NOTE — Telephone Encounter (Signed)
Please move up appt to next available 

## 2018-11-15 NOTE — Telephone Encounter (Signed)
Please advise if you have these forms.

## 2018-11-15 NOTE — Telephone Encounter (Signed)
Misty Stanley with [facility] ph# [phone] called re: status of receipt of Medical Clearance form for patient's oral surgery. Please call Misty Stanley at the ph# listed above to advise or fax # [phone]

## 2018-11-18 ENCOUNTER — Telehealth: Payer: Self-pay | Admitting: Endocrinology

## 2018-11-18 ENCOUNTER — Ambulatory Visit: Payer: Medicare Other | Admitting: Endocrinology

## 2018-11-18 NOTE — Telephone Encounter (Signed)
Patient is returning call to [facility], please advise, thanks

## 2018-11-24 ENCOUNTER — Ambulatory Visit (INDEPENDENT_AMBULATORY_CARE_PROVIDER_SITE_OTHER): Payer: Medicare Other | Admitting: Endocrinology

## 2018-11-24 ENCOUNTER — Encounter: Payer: Self-pay | Admitting: Endocrinology

## 2018-11-24 VITALS — BP 120/70 | HR 90 | Ht 63.0 in | Wt 179.6 lb

## 2018-11-24 DIAGNOSIS — E0590 Thyrotoxicosis, unspecified without thyrotoxic crisis or storm: Secondary | ICD-10-CM

## 2018-11-24 LAB — TSH: TSH: <0.01 u[IU]/mL — ABNORMAL LOW (ref 0.35–4.50)

## 2018-11-24 LAB — T4, FREE: Free T4: 1.22 ng/dL (ref 0.60–1.60)

## 2018-11-24 MED ORDER — TESTOSTERONE CYPIONATE 100 MG/ML IM SOLN: INTRAMUSCULAR | 0 refills | Status: DC

## 2018-11-24 MED ORDER — METHIMAZOLE 10 MG PO TABS: 20.0000 mg | ORAL_TABLET | Freq: Two times a day (BID) | ORAL | 2 refills | Status: DC

## 2018-11-24 NOTE — Patient Instructions (Addendum)
blood tests are requested for you today.  We'll let you know about the results.  If ever you have fever while taking methimazole, stop it and call us, even if the reason is obvious, because of the risk of a rare side-effect.  When the thyroid level is better, you can consider other treatment options, such as the radioactive iodine pill.  It works like this: We would first check a thyroid "scan" (a special, but easy and painless type of thyroid x ray).  you go to the x-ray department of the hospital to swallow a pill, which contains a miniscule amount of radiation.  You will not notice any symptoms from this.  You will go back to the x-ray department the next day, to lie down in front of a camera.  The results of this will be sent to me.   Based on the results, i hope to order for you a treatment pill of radioactive iodine.  Although it is a larger amount of radiation, you will again notice no symptoms from this.  The pill is gone from your body in a few days (during which you should stay away from other people), but takes several months to work.  Therefore, please return here approximately 6-8 weeks after the treatment.  This treatment has been available for many years, and the only known side-effect is an underactive thyroid.  It is possible that i would eventually prescribe for you a thyroid hormone pill, which is very inexpensive.  You don't have to worry about side-effects of this thyroid hormone pill, because it is the same molecule your thyroid makes. I have sent a prescription to your pharmacy, to refill the testosterone.  Please come back for a follow-up appointment in approx 6 weeks.

## 2018-11-24 NOTE — Progress Notes (Signed)
Subjective:    Patient ID: Anthony Rowe, adult    DOB: 1981-08-17, 38 y.o.   MRN: [id number]  HPI Pt is under medical rx for transgender state (F to M; he had breast reduction surgery in 2017; he last went to counseling in 2014; he has been on testosterone since 2015; dosage is limited by polycythemia).  Last dose was 1 month ago.  [facility] ([facility]  I accessed [facility], and no rx was claimed then.   Pt also has hyperthyroidism (dx'ed 2019; he has never had thyroid imaging; tapazole is chosen as initial rx, due to severity).  He takes tapazole as rx'ed.  He reports slight tremor of the hands, and assoc ongoing anxiety.   Past Medical History:  Diagnosis Date  . Asthma   . Bronchitis   . Endometriosis   . Hypertension   . Migraine     Past Surgical History:  Procedure Laterality Date  . ANKLE SURGERY    . MASTECTOMY Bilateral 2017   male to male     Social History   Socioeconomic History  . Marital status: Married    Spouse name: Not on file  . Number of children: Not on file  . Years of education: Not on file  . Highest education level: Not on file  Occupational History  . Not on file  Social Needs  . Financial resource strain: Not on file  . Food insecurity:    Worry: Not on file    Inability: Not on file  . Transportation needs:    Medical: Not on file    Non-medical: Not on file  Tobacco Use  . Smoking status: Never Smoker  . Smokeless tobacco: Never Used  Substance and Sexual Activity  . Alcohol use: No  . Drug use: No  . Sexual activity: Not Currently  Lifestyle  . Physical activity:    Days per week: Not on file    Minutes per session: Not on file  . Stress: Not on file  Relationships  . Social connections:    Talks on phone: Not on file    Gets together: Not on file    Attends religious service: Not on file    Active member of club or organization: Not on file    Attends meetings of clubs or  organizations: Not on file    Relationship status: Not on file  . Intimate partner violence:    Fear of current or ex partner: Not on file    Emotionally abused: Not on file    Physically abused: Not on file    Forced sexual activity: Not on file  Other Topics Concern  . Not on file  Social History Narrative  . Not on file    Current Outpatient Medications on File Prior to Visit  Medication Sig Dispense Refill  . albuterol (PROVENTIL HFA;VENTOLIN HFA) 108 (90 Base) MCG/ACT inhaler 2 Puff every 4-6 hours as needed 1 Inhaler 1  . alprazolam (XANAX) 2 MG tablet Take by mouth.    . cyclobenzaprine (FLEXERIL) 10 MG tablet Take by mouth.    . diclofenac sodium (VOLTAREN) 1 % GEL APPLY 4 GRAMS 4 TIMES A DAY AS NEEDED FOR PAIN  5  . famotidine (PEPCID) 20 MG tablet Take 1 tablet (20 mg total) by mouth 2 (two) times daily. 180 tablet 1  . FLOVENT HFA 220 MCG/ACT inhaler TAKE 2 PUFFS BY MOUTH TWICE A DAY  5  .  fluticasone (FLONASE) 50 MCG/ACT nasal spray Place 2 sprays into both nostrils daily. 1 g 0  . hydrochlorothiazide (HYDRODIURIL) 25 MG tablet hydrochlorothiazide 25 mg tablet  Take 1 tablet every day by oral route.    . hydrOXYzine (ATARAX/VISTARIL) 25 MG tablet TAKE 1 TABLET BY MOUTH TWICE A DAY AS NEEDED ITCHING  2  . ibuprofen (ADVIL,MOTRIN) 800 MG tablet Take 800-1,600 mg by mouth every 8 (eight) hours as needed for headache, mild pain or moderate pain (migraine).     Marland Kitchen ipratropium (ATROVENT) 0.06 % nasal spray Place 2 sprays into both nostrils 4 (four) times daily. 15 mL 0  . levocetirizine (XYZAL) 5 MG tablet Take 1 tablet (5 mg total) by mouth 2 (two) times daily. 60 tablet 5  . lisinopril (PRINIVIL,ZESTRIL) 20 MG tablet Take 1 tablet (20 mg total) by mouth daily. 30 tablet 1  . loratadine (CLARITIN) 10 MG tablet loratadine 10 mg tablet  Take 1 tablet every day by oral route for 30 days.    . ondansetron (ZOFRAN-ODT) 4 MG disintegrating tablet ondansetron 4 mg disintegrating  tablet  Take 1 tablet 3 times a day by oral route as needed.    . predniSONE (DELTASONE) 50 MG tablet Take 1 tablet (50 mg total) by mouth daily. 5 tablet 0  . tiZANidine (ZANAFLEX) 4 MG tablet Take 4 mg by mouth every 6 (six) hours as needed for muscle spasms.    . traZODone (DESYREL) 100 MG tablet Take 100 mg by mouth at bedtime as needed for sleep.      No current facility-administered medications on file prior to visit.     Allergies  Allergen Reactions  . Frovatriptan Other (See Comments)    Numbness in both legs  . Sulfa Antibiotics Other (See Comments)    Makes patients legs numb     Family History  Problem Relation Age of Onset  . Cancer Mother        lung  . Hypertension Maternal Grandmother   . Diabetes Maternal Grandmother   . Thyroid disease Neg Hx     BP 120/70 (BP Location: Right Arm, Patient Position: Sitting, Cuff Size: Normal)   Pulse 90   Ht 5\' 3"  (1.6 m)   Wt 179 lb 9.6 oz (81.5 kg)   SpO2 97%   BMI 31.81 kg/m    Review of Systems Denies fever    Objective:   Physical Exam VITAL SIGNS:  See vs page GENERAL: no distress NECK: thyroid is 2-3 times normal size, diffuse.  No palpable nodule.  Skin: not diaphoretic. Neuro: slight tremor of the hands.    Lab Results  Component Value Date   TSH <0.01 (L) 11/24/2018      Assessment & Plan:  Dental infection.  We'll clear for surgery as soon as I can.  Hyperthyroidism: persistent despite rx.  Patient Instructions  blood tests are requested for you today.  We'll let you know about the results.  If ever you have fever while taking methimazole, stop it and call us, even if the reason is obvious, because of the risk of a rare side-effect.  When the thyroid level is better, you can consider other treatment options, such as the radioactive iodine pill.  It works like this: We would first check a thyroid "scan" (a special, but easy and painless type of thyroid x ray).  you go to the x-ray department of  the hospital to swallow a pill, which contains a miniscule amount of radiation.  You  will not notice any symptoms from this.  You will go back to the x-ray department the next day, to lie down in front of a camera.  The results of this will be sent to me.   Based on the results, i hope to order for you a treatment pill of radioactive iodine.  Although it is a larger amount of radiation, you will again notice no symptoms from this.  The pill is gone from your body in a few days (during which you should stay away from other people), but takes several months to work.  Therefore, please return here approximately 6-8 weeks after the treatment.  This treatment has been available for many years, and the only known side-effect is an underactive thyroid.  It is possible that i would eventually prescribe for you a thyroid hormone pill, which is very inexpensive.  You don't have to worry about side-effects of this thyroid hormone pill, because it is the same molecule your thyroid makes. I have sent a prescription to your pharmacy, to refill the testosterone.  Please come back for a follow-up appointment in approx 6 weeks.

## 2018-11-25 ENCOUNTER — Telehealth: Payer: Self-pay | Admitting: Endocrinology

## 2018-11-25 MED ORDER — METHIMAZOLE 10 MG PO TABS: 40.0000 mg | ORAL_TABLET | Freq: Two times a day (BID) | ORAL | 2 refills | Status: DC

## 2018-11-25 NOTE — Telephone Encounter (Signed)
Closed as duplicate 

## 2018-11-25 NOTE — Telephone Encounter (Signed)
Patient states returning a call to our office

## 2018-11-28 ENCOUNTER — Telehealth: Payer: Self-pay | Admitting: Endocrinology

## 2018-11-28 ENCOUNTER — Other Ambulatory Visit: Payer: Self-pay | Admitting: Endocrinology

## 2018-11-28 MED ORDER — TESTOSTERONE CYPIONATE 100 MG/ML IM SOLN: INTRAMUSCULAR | 0 refills | Status: DC

## 2018-11-28 NOTE — Telephone Encounter (Signed)
Returned call to inform clearance is on hold at this time. Aware of pt return date to follow up re: clearing pt for surgery.

## 2018-11-28 NOTE — Telephone Encounter (Signed)
Please advise. I have not received this document to return to their office

## 2018-11-28 NOTE — Telephone Encounter (Signed)
[facility] called re: status of medical clearance for Dr. Everardo All re the patient. Please call ph# [phone] to advise if form has been received/reviewed

## 2018-11-28 NOTE — Telephone Encounter (Signed)
He was seen, but he could not be cleared.  He will come back for f/u.

## 2019-01-05 ENCOUNTER — Ambulatory Visit (INDEPENDENT_AMBULATORY_CARE_PROVIDER_SITE_OTHER): Payer: Medicare Other | Admitting: Endocrinology

## 2019-01-05 ENCOUNTER — Ambulatory Visit: Payer: Medicare Other | Admitting: Endocrinology

## 2019-01-05 ENCOUNTER — Encounter: Payer: Self-pay | Admitting: Endocrinology

## 2019-01-05 ENCOUNTER — Other Ambulatory Visit: Payer: Self-pay

## 2019-01-05 VITALS — BP 120/70 | HR 119 | Ht 63.0 in | Wt 177.2 lb

## 2019-01-05 DIAGNOSIS — R000 Tachycardia, unspecified: Secondary | ICD-10-CM | POA: Diagnosis not present

## 2019-01-05 DIAGNOSIS — E0590 Thyrotoxicosis, unspecified without thyrotoxic crisis or storm: Secondary | ICD-10-CM

## 2019-01-05 MED ORDER — METOPROLOL SUCCINATE ER 25 MG PO TB24: 25.0000 mg | ORAL_TABLET | Freq: Every day | ORAL | 5 refills | Status: DC

## 2019-01-05 NOTE — Progress Notes (Signed)
Subjective:    Patient ID: Anthony Rowe, adult    DOB: 1981/10/18, 38 y.o.   MRN: [id number]  HPI Pt is under medical rx for transgender state (F to M; he had breast reduction surgery in 2017; he last went to counseling in 2014; he has been on testosterone since 2015; dosage is limited by polycythemia).  Last dose was 1 month ago.  [facility]  I accessed [facility], and no rx was claimed then.   Pt also has hyperthyroidism (dx'ed 2019; he has never had thyroid imaging; tapazole is chosen as initial rx, due to severity).  He takes tapazole as rx'ed.  He reports slight doe, and assoc anxiety.   Past Medical History:  Diagnosis Date  . Asthma   . Bronchitis   . Endometriosis   . Hypertension   . Migraine     Past Surgical History:  Procedure Laterality Date  . ANKLE SURGERY    . MASTECTOMY Bilateral 2017   male to male     Social History   Socioeconomic History  . Marital status: Married    Spouse name: Not on file  . Number of children: Not on file  . Years of education: Not on file  . Highest education level: Not on file  Occupational History  . Not on file  Social Needs  . Financial resource strain: Not on file  . Food insecurity:    Worry: Not on file    Inability: Not on file  . Transportation needs:    Medical: Not on file    Non-medical: Not on file  Tobacco Use  . Smoking status: Never Smoker  . Smokeless tobacco: Never Used  Substance and Sexual Activity  . Alcohol use: No  . Drug use: No  . Sexual activity: Not Currently  Lifestyle  . Physical activity:    Days per week: Not on file    Minutes per session: Not on file  . Stress: Not on file  Relationships  . Social connections:    Talks on phone: Not on file    Gets together: Not on file    Attends religious service: Not on file    Active member of club or organization: Not on file    Attends meetings of clubs or organizations: Not on file   Relationship status: Not on file  . Intimate partner violence:    Fear of current or ex partner: Not on file    Emotionally abused: Not on file    Physically abused: Not on file    Forced sexual activity: Not on file  Other Topics Concern  . Not on file  Social History Narrative  . Not on file    Current Outpatient Medications on File Prior to Visit  Medication Sig Dispense Refill  . albuterol (PROVENTIL HFA;VENTOLIN HFA) 108 (90 Base) MCG/ACT inhaler 2 Puff every 4-6 hours as needed 1 Inhaler 1  . alprazolam (XANAX) 2 MG tablet Take by mouth.    . cyclobenzaprine (FLEXERIL) 10 MG tablet Take by mouth.    . diclofenac sodium (VOLTAREN) 1 % GEL APPLY 4 GRAMS 4 TIMES A DAY AS NEEDED FOR PAIN  5  . famotidine (PEPCID) 20 MG tablet Take 1 tablet (20 mg total) by mouth 2 (two) times daily. 180 tablet 1  . FLOVENT HFA 220 MCG/ACT inhaler TAKE 2 PUFFS BY MOUTH TWICE A DAY  5  . fluticasone (FLONASE) 50 MCG/ACT nasal  spray Place 2 sprays into both nostrils daily. 1 g 0  . hydrochlorothiazide (HYDRODIURIL) 25 MG tablet hydrochlorothiazide 25 mg tablet  Take 1 tablet every day by oral route.    . hydrOXYzine (ATARAX/VISTARIL) 25 MG tablet TAKE 1 TABLET BY MOUTH TWICE A DAY AS NEEDED ITCHING  2  . ibuprofen (ADVIL,MOTRIN) 800 MG tablet Take 800-1,600 mg by mouth every 8 (eight) hours as needed for headache, mild pain or moderate pain (migraine).     Marland Kitchen ipratropium (ATROVENT) 0.06 % nasal spray Place 2 sprays into both nostrils 4 (four) times daily. 15 mL 0  . levocetirizine (XYZAL) 5 MG tablet Take 1 tablet (5 mg total) by mouth 2 (two) times daily. 60 tablet 5  . lisinopril (PRINIVIL,ZESTRIL) 20 MG tablet Take 1 tablet (20 mg total) by mouth daily. 30 tablet 1  . loratadine (CLARITIN) 10 MG tablet loratadine 10 mg tablet  Take 1 tablet every day by oral route for 30 days.    . methimazole (TAPAZOLE) 10 MG tablet Take 4 tablets (40 mg total) by mouth 2 (two) times daily. 240 tablet 2  .  ondansetron (ZOFRAN-ODT) 4 MG disintegrating tablet ondansetron 4 mg disintegrating tablet  Take 1 tablet 3 times a day by oral route as needed.    . testosterone cypionate (DEPO-TESTOSTERONE) 100 MG/ML injection 50 mg IM, every 10 days 10 mL 0  . tiZANidine (ZANAFLEX) 4 MG tablet Take 4 mg by mouth every 6 (six) hours as needed for muscle spasms.    . traZODone (DESYREL) 100 MG tablet Take 100 mg by mouth at bedtime as needed for sleep.      No current facility-administered medications on file prior to visit.     Allergies  Allergen Reactions  . Frovatriptan Other (See Comments)    Numbness in both legs  . Sulfa Antibiotics Other (See Comments)    Makes patients legs numb     Family History  Problem Relation Age of Onset  . Cancer Mother        lung  . Hypertension Maternal Grandmother   . Diabetes Maternal Grandmother   . Thyroid disease Neg Hx     BP 120/70 (BP Location: Right Arm, Patient Position: Sitting, Cuff Size: Normal)   Pulse (!) 119   Ht  (1.6 m)   Wt 177 lb 3.2 oz (80.4 kg)   SpO2 97%   BMI 31.39 kg/m    Review of Systems Denies fever, but he has palpitations    Objective:   Physical Exam VITAL SIGNS:  See vs page GENERAL: no distress EYES: moderate bilat proptosis.   NECK: thyroid is twice normal size--diffuse.     Lab Results  Component Value Date   TSH <0.01 (L) 01/05/2019       Assessment & Plan:  Hyperthyroidism: worse. Tachycardia: i'll rx b-blocker until RAI works  Patient Instructions  blood tests are requested for you today.  We'll let you know about the results.  If ever you have fever while taking methimazole, stop it and call us, even if the reason is obvious, because of the risk of a rare side-effect.   If it is high again, let's do the radioactive iodine pill:  We would first check a thyroid "scan" (a special, but easy and painless type of thyroid x ray).  you go to the x-ray department of the hospital to swallow a pill,  which contains a miniscule amount of radiation.  You will not notice any symptoms from  this.  You will go back to the x-ray department the next day, to lie down in front of a camera.  The results of this will be sent to me.   Based on the results, I would then hope to order for you a treatment pill of radioactive iodine.  Although it is a larger amount of radiation, you will again notice no symptoms from this.  The pill is gone from your body in a few days (during which you should stay away from other people), but takes several months to work. please return here approximately 4 days after the treatment.  This treatment has been available for many years, and the only known side-effect is an underactive thyroid.  It is possible that i would eventually prescribe for you a thyroid hormone pill, which is very inexpensive.  You don't have to worry about side-effects of this thyroid hormone pill, because it is the same molecule your thyroid makes.   I have sent a prescription to your pharmacy, to slow the heart rate.  If your dentist wants clearance, we have to wait until the thyroid is better.

## 2019-01-05 NOTE — Patient Instructions (Addendum)
blood tests are requested for you today.  We'll let you know about the results.  If ever you have fever while taking methimazole, stop it and call us, even if the reason is obvious, because of the risk of a rare side-effect.   If it is high again, let's do the radioactive iodine pill:  We would first check a thyroid "scan" (a special, but easy and painless type of thyroid x ray).  you go to the x-ray department of the hospital to swallow a pill, which contains a miniscule amount of radiation.  You will not notice any symptoms from this.  You will go back to the x-ray department the next day, to lie down in front of a camera.  The results of this will be sent to me.   Based on the results, I would then hope to order for you a treatment pill of radioactive iodine.  Although it is a larger amount of radiation, you will again notice no symptoms from this.  The pill is gone from your body in a few days (during which you should stay away from other people), but takes several months to work. please return here approximately 4 days after the treatment.  This treatment has been available for many years, and the only known side-effect is an underactive thyroid.  It is possible that i would eventually prescribe for you a thyroid hormone pill, which is very inexpensive.  You don't have to worry about side-effects of this thyroid hormone pill, because it is the same molecule your thyroid makes.   I have sent a prescription to your pharmacy, to slow the heart rate.  If your dentist wants clearance, we have to wait until the thyroid is better.

## 2019-01-06 LAB — T4, FREE: Free T4: 2.32 ng/dL — ABNORMAL HIGH (ref 0.60–1.60)

## 2019-01-06 LAB — TSH: TSH: <0.01 u[IU]/mL — ABNORMAL LOW (ref 0.35–4.50)

## 2019-01-09 ENCOUNTER — Encounter: Payer: Self-pay | Admitting: Endocrinology

## 2019-01-09 ENCOUNTER — Other Ambulatory Visit: Payer: Self-pay

## 2019-01-09 ENCOUNTER — Ambulatory Visit (INDEPENDENT_AMBULATORY_CARE_PROVIDER_SITE_OTHER): Payer: Medicare Other | Admitting: Endocrinology

## 2019-01-09 VITALS — BP 128/78 | HR 103 | Ht 63.0 in | Wt 177.0 lb

## 2019-01-09 DIAGNOSIS — E0590 Thyrotoxicosis, unspecified without thyrotoxic crisis or storm: Secondary | ICD-10-CM | POA: Diagnosis not present

## 2019-01-09 MED ORDER — METOPROLOL SUCCINATE ER 50 MG PO TB24: 50.0000 mg | ORAL_TABLET | Freq: Every day | ORAL | 3 refills | Status: DC

## 2019-01-09 NOTE — Patient Instructions (Signed)
I have sent a prescription to your pharmacy, to double the metoprolol. you will receive a phone call, about a day and time for an appointment, for the first iodine pill. Please come back for a follow-up appointment 4-7 days after the treatment pill.

## 2019-01-09 NOTE — Progress Notes (Signed)
Subjective:    Patient ID: Anthony Rowe, adult    DOB: September 28, 1981, 38 y.o.   MRN: [id number]  HPI Pt is under medical rx for transgender state (F to M; he had breast reduction surgery in 2017; he last went to counseling in 2014; he has been on testosterone since 2015; dosage is limited by polycythemia).  Last dose was 1 month ago.  [facility] says [facility] not receive [facility] (was sent to [facility]).  I accessed [facility], and no rx was claimed then.   Pt also has hyperthyroidism (dx'ed 2019; he has never had thyroid imaging; tapazole is chosen as initial rx, due to severity).  He takes toprol as rx'ed.  Palpitations are only slightly improved Past Medical History:  Diagnosis Date  . Asthma   . Bronchitis   . Endometriosis   . Hypertension   . Migraine     Past Surgical History:  Procedure Laterality Date  . ANKLE SURGERY    . MASTECTOMY Bilateral 2017   male to male     Social History   Socioeconomic History  . Marital status: Married    Spouse name: Not on file  . Number of children: Not on file  . Years of education: Not on file  . Highest education level: Not on file  Occupational History  . Not on file  Social Needs  . Financial resource strain: Not on file  . Food insecurity:    Worry: Not on file    Inability: Not on file  . Transportation needs:    Medical: Not on file    Non-medical: Not on file  Tobacco Use  . Smoking status: Never Smoker  . Smokeless tobacco: Never Used  Substance and Sexual Activity  . Alcohol use: No  . Drug use: No  . Sexual activity: Not Currently  Lifestyle  . Physical activity:    Days per week: Not on file    Minutes per session: Not on file  . Stress: Not on file  Relationships  . Social connections:    Talks on phone: Not on file    Gets together: Not on file    Attends religious service: Not on file    Active member of club or organization: Not on file    Attends meetings of clubs or organizations: Not on file   Relationship status: Not on file  . Intimate partner violence:    Fear of current or ex partner: Not on file    Emotionally abused: Not on file    Physically abused: Not on file    Forced sexual activity: Not on file  Other Topics Concern  . Not on file  Social History Narrative  . Not on file    Current Outpatient Medications on File Prior to Visit  Medication Sig Dispense Refill  . albuterol (PROVENTIL HFA;VENTOLIN HFA) 108 (90 Base) MCG/ACT inhaler 2 Puff every 4-6 hours as needed 1 Inhaler 1  . alprazolam (XANAX) 2 MG tablet Take by mouth.    . cyclobenzaprine (FLEXERIL) 10 MG tablet Take by mouth.    . diclofenac sodium (VOLTAREN) 1 % GEL APPLY 4 GRAMS 4 TIMES A DAY AS NEEDED FOR PAIN  5  . famotidine (PEPCID) 20 MG tablet Take 1 tablet (20 mg total) by mouth 2 (two) times daily. 180 tablet 1  . FLOVENT HFA 220 MCG/ACT inhaler TAKE 2 PUFFS BY MOUTH TWICE A DAY  5  . fluticasone (FLONASE) 50 MCG/ACT nasal spray Place 2 sprays  into both nostrils daily. 1 g 0  . hydrochlorothiazide (HYDRODIURIL) 25 MG tablet hydrochlorothiazide 25 mg tablet  Take 1 tablet every day by oral route.    . hydrOXYzine (ATARAX/VISTARIL) 25 MG tablet TAKE 1 TABLET BY MOUTH TWICE A DAY AS NEEDED ITCHING  2  . ibuprofen (ADVIL,MOTRIN) 800 MG tablet Take 800-1,600 mg by mouth every 8 (eight) hours as needed for headache, mild pain or moderate pain (migraine).     Marland Kitchen ipratropium (ATROVENT) 0.06 % nasal spray Place 2 sprays into both nostrils 4 (four) times daily. 15 mL 0  . levocetirizine (XYZAL) 5 MG tablet Take 1 tablet (5 mg total) by mouth 2 (two) times daily. 60 tablet 5  . lisinopril (PRINIVIL,ZESTRIL) 20 MG tablet Take 1 tablet (20 mg total) by mouth daily. 30 tablet 1  . loratadine (CLARITIN) 10 MG tablet loratadine 10 mg tablet  Take 1 tablet every day by oral route for 30 days.    . ondansetron (ZOFRAN-ODT) 4 MG disintegrating tablet ondansetron 4 mg disintegrating tablet  Take 1 tablet 3 times a day  by oral route as needed.    . testosterone cypionate (DEPO-TESTOSTERONE) 100 MG/ML injection 50 mg IM, every 10 days 10 mL 0  . tiZANidine (ZANAFLEX) 4 MG tablet Take 4 mg by mouth every 6 (six) hours as needed for muscle spasms.    . traZODone (DESYREL) 100 MG tablet Take 100 mg by mouth at bedtime as needed for sleep.      No current facility-administered medications on file prior to visit.     Allergies  Allergen Reactions  . Frovatriptan Other (See Comments)    Numbness in both legs  . Sulfa Antibiotics Other (See Comments)    Makes patients legs numb     Family History  Problem Relation Age of Onset  . Cancer Mother        lung  . Hypertension Maternal Grandmother   . Diabetes Maternal Grandmother   . Thyroid disease Neg Hx     BP 128/78 (BP Location: Left Arm, Patient Position: Sitting, Cuff Size: Normal)   Pulse (!) 103   Ht 5\' 3"  (1.6 m)   Wt 177 lb (80.3 kg)   SpO2 95%   BMI 31.35 kg/m    Review of Systems Denies wheezing    Objective:   Physical Exam VITAL SIGNS:  See vs page GENERAL: no distress LUNGS:  Clear to auscultation HEART:  Regular rate and rhythm without murmurs noted. Normal S1,S2.     Lab Results  Component Value Date   TSH <0.01 (L) 01/05/2019       Assessment & Plan:  Hyperthyroidism: worse.  Tachycardia, improved but persistent.   Patient Instructions  I have sent a prescription to your pharmacy, to double the metoprolol. you will receive a phone call, about a day and time for an appointment, for the first iodine pill. Please come back for a follow-up appointment 4-7 days after the treatment pill.

## 2019-01-20 ENCOUNTER — Ambulatory Visit: Payer: Medicare Other | Admitting: Endocrinology

## 2019-03-29 ENCOUNTER — Emergency Department (HOSPITAL_COMMUNITY): Payer: Medicare Other

## 2019-03-29 ENCOUNTER — Ambulatory Visit (INDEPENDENT_AMBULATORY_CARE_PROVIDER_SITE_OTHER): Payer: Medicare Other | Admitting: Allergy

## 2019-03-29 ENCOUNTER — Encounter: Payer: Self-pay | Admitting: Allergy

## 2019-03-29 ENCOUNTER — Other Ambulatory Visit: Payer: Self-pay

## 2019-03-29 ENCOUNTER — Emergency Department (HOSPITAL_COMMUNITY)
Admission: EM | Admit: 2019-03-29 | Discharge: 2019-03-29 | Disposition: A | Discharge status: Home / Self Care | Payer: Medicare Other | Attending: Emergency Medicine | Admitting: Emergency Medicine

## 2019-03-29 VITALS — BP 180/120 | HR 123 | Temp 98.5°F | Resp 16 | Ht 61.61 in | Wt 176.8 lb

## 2019-03-29 DIAGNOSIS — G43809 Other migraine, not intractable, without status migrainosus: Secondary | ICD-10-CM

## 2019-03-29 DIAGNOSIS — J4530 Mild persistent asthma, uncomplicated: Secondary | ICD-10-CM | POA: Diagnosis not present

## 2019-03-29 DIAGNOSIS — I10 Essential (primary) hypertension: Secondary | ICD-10-CM | POA: Diagnosis not present

## 2019-03-29 DIAGNOSIS — Z8709 Personal history of other diseases of the respiratory system: Secondary | ICD-10-CM | POA: Diagnosis not present

## 2019-03-29 DIAGNOSIS — I160 Hypertensive urgency: Secondary | ICD-10-CM | POA: Diagnosis not present

## 2019-03-29 DIAGNOSIS — Z79899 Other long term (current) drug therapy: Secondary | ICD-10-CM | POA: Insufficient documentation

## 2019-03-29 DIAGNOSIS — E039 Hypothyroidism, unspecified: Secondary | ICD-10-CM | POA: Diagnosis not present

## 2019-03-29 DIAGNOSIS — E0590 Thyrotoxicosis, unspecified without thyrotoxic crisis or storm: Secondary | ICD-10-CM

## 2019-03-29 DIAGNOSIS — L299 Pruritus, unspecified: Secondary | ICD-10-CM | POA: Diagnosis not present

## 2019-03-29 DIAGNOSIS — R51 Headache: Secondary | ICD-10-CM | POA: Diagnosis present

## 2019-03-29 LAB — COMPREHENSIVE METABOLIC PANEL
ALT: 20 U/L (ref 0–44)
AST: 20 U/L (ref 15–41)
Albumin: 3.5 g/dL (ref 3.5–5.0)
Alkaline Phosphatase: 83 U/L (ref 38–126)
Anion gap: 8 (ref 5–15)
BUN: 7 mg/dL (ref 6–20)
CO2: 26 mmol/L (ref 22–32)
Calcium: 9.2 mg/dL (ref 8.9–10.3)
Chloride: 106 mmol/L (ref 98–111)
Creatinine, Ser: 0.7 mg/dL (ref 0.61–1.24)
GFR calc Af Amer: >60 mL/min (ref >60)
GFR calc non Af Amer: >60 mL/min (ref >60)
Glucose, Bld: 109 mg/dL — ABNORMAL HIGH (ref 70–99)
Potassium: 2.9 mmol/L — ABNORMAL LOW (ref 3.5–5.1)
Sodium: 140 mmol/L (ref 135–145)
Total Bilirubin: 0.6 mg/dL (ref 0.3–1.2)
Total Protein: 5.7 g/dL — ABNORMAL LOW (ref 6.5–8.1)

## 2019-03-29 LAB — CBC WITH DIFFERENTIAL/PLATELET
Abs Immature Granulocytes: 0.01 10*3/uL (ref 0.00–0.07)
Basophils Absolute: 0 10*3/uL (ref 0.0–0.1)
Basophils Relative: 0 %
Eosinophils Absolute: 0 10*3/uL (ref 0.0–0.5)
Eosinophils Relative: 1 %
HCT: 40.7 % (ref 39.0–52.0)
Hemoglobin: 13.7 g/dL (ref 13.0–17.0)
Immature Granulocytes: 0 %
Lymphocytes Relative: 54 %
Lymphs Abs: 3 10*3/uL (ref 0.7–4.0)
MCH: 27.5 pg (ref 26.0–34.0)
MCHC: 33.7 g/dL (ref 30.0–36.0)
MCV: 81.7 fL (ref 80.0–100.0)
Monocytes Absolute: 0.5 10*3/uL (ref 0.1–1.0)
Monocytes Relative: 9 %
Neutro Abs: 2.1 10*3/uL (ref 1.7–7.7)
Neutrophils Relative %: 36 %
Platelets: 298 10*3/uL (ref 150–400)
RBC: 4.98 MIL/uL (ref 4.22–5.81)
RDW: 13.4 % (ref 11.5–15.5)
WBC: 5.6 10*3/uL (ref 4.0–10.5)
nRBC: 0 % (ref 0.0–0.2)

## 2019-03-29 LAB — POC URINE PREG, ED: Preg Test, Ur: NEGATIVE

## 2019-03-29 LAB — RAPID URINE DRUG SCREEN, HOSP PERFORMED
Amphetamines: NOT DETECTED
Barbiturates: NOT DETECTED
Benzodiazepines: NOT DETECTED
Cocaine: NOT DETECTED
Opiates: NOT DETECTED
Tetrahydrocannabinol: NOT DETECTED

## 2019-03-29 LAB — T4, FREE: Free T4: 3.94 ng/dL — ABNORMAL HIGH (ref 0.82–1.77)

## 2019-03-29 LAB — TSH: TSH: <0.01 u[IU]/mL — ABNORMAL LOW (ref 0.350–4.500)

## 2019-03-29 MED ORDER — PROCHLORPERAZINE EDISYLATE 10 MG/2ML IJ SOLN
10.0000 mg | Freq: Once | INTRAMUSCULAR | Status: AC
  Administered 2019-03-29: 19:00:00 10 mg via INTRAVENOUS
  Filled 2019-03-29: qty 2

## 2019-03-29 MED ORDER — LACTATED RINGERS IV BOLUS
1000.0000 mL | Freq: Once | INTRAVENOUS | Status: AC
  Administered 2019-03-29: 19:00:00 1000 mL via INTRAVENOUS

## 2019-03-29 NOTE — Progress Notes (Signed)
Follow-up Note  RE: Anthony Rowe MRN: [id number] DOB: Jun 04, 1981 Date of Office Visit: 03/29/2019   History of present illness: Anthony Rowe is a 38 y.o. adult presenting today for follow-up of pruritus.  He was last seen in the office on 07/27/18 by myself.    He states since his last visit his itching did improve with the medication regimen that was initiated after his last visit.  However he states that he ran out of some of the medication and currently has been taking famotidine and hydroxyzine at least once a day.  He states about 2 months ago the itching returned in full force out of the blue and he is continued to have issues with itching since.  He does feel that the Eucrisa samples that was provided at the last visit did help some with the itch control.  Most concerning today he states that he has been having a migraine headache for the past 2 days as well as some blurred vision.  Upon further questioning he did state that he had a syncopal episode earlier today while at work.  He states with his headache that he did take some Goody powder today he has blood pressure medications including lisinopril, hydrochlorothiazide and metoprolol that he has not taken in several months.  He was able to pull out a bottle of metoprolol from his pocket today but he is not taking this.  He does have a history of hypertensive urgency/emergencies and per patient has had "stroke level "blood pressure and has been treated in the ED on multiple occasions. He also states that he has been having some chest tightness but has not been using his Flovent or his albuterol inhalers.  In the interval since last visit he was diagnosed with thyroid disease and states that his thyroid levels have not been able to be controlled yet.  He states he was supposed to have some type of procedure done in the hospital with his thyroid but due to the COVID this has been postponed.   Review of systems: Review of Systems   Constitutional: Negative for chills, fever and malaise/fatigue.  HENT: Negative for congestion, ear discharge, nosebleeds and sore throat.   Eyes: Positive for blurred vision and redness. Negative for pain and discharge.  Respiratory: Positive for cough and shortness of breath.   Cardiovascular: Positive for chest pain.  Gastrointestinal: Negative for abdominal pain, constipation, diarrhea, heartburn, nausea and vomiting.  Musculoskeletal: Negative for joint pain.  Skin: Positive for itching. Negative for rash.  Neurological: Positive for loss of consciousness and headaches.    All other systems negative unless noted above in HPI  Past medical/social/surgical/family history have been reviewed and are unchanged unless specifically indicated below.  No changes  Medication List: Allergies as of 03/29/2019      Reactions   Frovatriptan Other (See Comments)   Numbness in both legs   Sulfa Antibiotics Other (See Comments)   Makes patients legs numb       Medication List       Accurate as of Mar 29, 2019  5:15 PM. If you have any questions, ask your nurse or doctor.        albuterol 108 (90 Base) MCG/ACT inhaler Commonly known as:  VENTOLIN HFA 2 Puff every 4-6 hours as needed   alprazolam 2 MG tablet Commonly known as:  XANAX Take by mouth.   cyclobenzaprine 10 MG tablet Commonly known as:  FLEXERIL Take by mouth.   diclofenac sodium 1 %  Gel Commonly known as:  VOLTAREN APPLY 4 GRAMS 4 TIMES A DAY AS NEEDED FOR PAIN   famotidine 20 MG tablet Commonly known as:  Pepcid Take 1 tablet (20 mg total) by mouth 2 (two) times daily.   Flovent HFA 220 MCG/ACT inhaler Generic drug:  fluticasone TAKE 2 PUFFS BY MOUTH TWICE A DAY   fluticasone 50 MCG/ACT nasal spray Commonly known as:  FLONASE Place 2 sprays into both nostrils daily.   hydrochlorothiazide 25 MG tablet Commonly known as:  HYDRODIURIL hydrochlorothiazide 25 mg tablet  Take 1 tablet every day by oral route.    hydrOXYzine 25 MG tablet Commonly known as:  ATARAX/VISTARIL TAKE 1 TABLET BY MOUTH TWICE A DAY AS NEEDED ITCHING   ibuprofen 800 MG tablet Commonly known as:  ADVIL Take 800-1,600 mg by mouth every 8 (eight) hours as needed for headache, mild pain or moderate pain (migraine).   ipratropium 0.06 % nasal spray Commonly known as:  Atrovent Place 2 sprays into both nostrils 4 (four) times daily.   levocetirizine 5 MG tablet Commonly known as:  XYZAL Take 1 tablet (5 mg total) by mouth 2 (two) times daily.   lisinopril 20 MG tablet Commonly known as:  ZESTRIL Take 1 tablet (20 mg total) by mouth daily.   loratadine 10 MG tablet Commonly known as:  CLARITIN loratadine 10 mg tablet  Take 1 tablet every day by oral route for 30 days.   metoprolol succinate 50 MG 24 hr tablet Commonly known as:  TOPROL-XL Take 1 tablet (50 mg total) by mouth daily. Take with or immediately following a meal.   ondansetron 4 MG disintegrating tablet Commonly known as:  ZOFRAN-ODT ondansetron 4 mg disintegrating tablet  Take 1 tablet 3 times a day by oral route as needed.   testosterone cypionate 100 MG/ML injection Commonly known as:  Depo-Testosterone 50 mg IM, every 10 days   tiZANidine 4 MG tablet Commonly known as:  ZANAFLEX Take 4 mg by mouth every 6 (six) hours as needed for muscle spasms.   traZODone 100 MG tablet Commonly known as:  DESYREL Take 100 mg by mouth at bedtime as needed for sleep.       Known medication allergies: Allergies  Allergen Reactions  . Frovatriptan Other (See Comments)    Numbness in both legs  . Sulfa Antibiotics Other (See Comments)    Makes patients legs numb      Physical examination: Blood pressure (!) 180/120, pulse (!) 123, temperature 98.5 F (36.9 C), temperature source Tympanic, resp. rate 16, height 5' 1.61" (1.565 m), weight 176 lb 12.8 oz (80.2 kg), SpO2 93 %.  General: Intermittent alertness.  Toward the end of the visit patient did  appear to have some loss of conscious and required sternal rub.  HEENT: Scleral injection,  Neck: Supple without lymphadenopathy. Lungs: Clear to auscultation without wheezing, rhonchi or rales. {no increased work of breathing. CV: Tachycardic, normal S1, S2 without murmurs. Abdomen: Nondistended, nontender. Skin: Diaphoretic Extremities:  No clubbing, cyanosis or edema. Neuro:   Able to respond with sternal rub    Diagnositics/Labs: Component     Latest Ref Rng & Units 07/27/2018  IgE (Immunoglobulin E), Serum     6 - 495 IU/mL 30  D Pteronyssinus IgE     Class 0 kU/L <0.10  D Farinae IgE     Class 0 kU/L <0.10  Cat Dander IgE     Class 0/I kU/L 0.12 (A)  Dog Dander IgE  Class 0/I kU/L 0.16 (A)  [place] Grass IgE     Class 0 kU/L <0.10  Timothy Grass IgE     Class 0 kU/L <0.10  [place] Grass IgE     Class 0 kU/L <0.10  Cockroach, German IgE     Class 0 kU/L <0.10  Penicillium Chrysogen IgE     Class 0 kU/L <0.10  Cladosporium Herbarum IgE     Class 0 kU/L <0.10  Aspergillus Fumigatus IgE     Class 0 kU/L <0.10  Alternaria Alternata IgE     Class 0 kU/L <0.10  Maple/Box Elder IgE     Class 0/I kU/L 0.19 (A)  Common Silver Charletta Cousin IgE     Class II kU/L 0.60 (A)  Cedar, Mountain IgE     Class 0 kU/L <0.10  Oak, White IgE     Class 0 kU/L <0.10  Elm, American IgE     Class 0 kU/L <0.10  C[place]wood IgE     Class 0 kU/L <0.10  Pecan, Hickory IgE     Class 0 kU/L <0.10  White Mulberry IgE     Class 0 kU/L <0.10  Ragweed, Short IgE     Class II kU/L 1.12 (A)  Pigweed, Rough IgE     Class 0 kU/L <0.10  Sheep Sorrel IgE Qn     Class 0 kU/L <0.10  Mouse Urine IgE     Class 0 kU/L <0.10  Milk IgE     Class 0/I kU/L 0.14 (A)  Wheat IgE     Class 0 kU/L <0.10  Allergen Corn, IgE     Class 0 kU/L <0.10  Peanut IgE     Class 0 kU/L <0.10  Soybean IgE     Class 0 kU/L <0.10  Pork IgE     Class 0 kU/L <0.10  Beef IgE     Class 0 kU/L <0.10  Food Mix  (Seafoods) IgE      Negative  Egg, Whole IgE     Class 0 kU/L <0.10  Chocolate/Cacao IgE     Class 0 kU/L <0.10    Assessment and plan:   Hypertension  - concern for hypertensive urgency with symptoms in the office - HA, blurred vision and pt appeared to have another syncopal event in the office while trying to keep her alert.   He had BP reading in Rt arm as noted in the exam.   Approximately 20 later had a BP reading in Lt arm of 180/120.  He required sternal rubs to maintain alertness.  EMS arrived to transport to ED for further treatment.    - will notify PCP of current events  Chronic itch   -etiology of itch is unknown at this time.  Itch can be caused by a variety of different triggers including illness/infection, foods, medications, stings, exercise, pressure, vibrations, extremes of temperature to name a few however there may be no identifiable trigger.  Opiate medication use (oxycodone) as well as NSAIDs can cause itch as a side effect.    - environmental allergen panel does show detectable IgE to ragweed, tree pollens, cat dander, dog dander.  Also low IgE to milk as well.    - recommend taking either Zyrtec, Xyzal or Allegra 1 tablet twice a day with Pepcid  1 tablet twice a day   - can use hydroxyzine  at bedtime as needed for nighttime itch control   - advised that he should continue to get his thyroid  disease under control  Asthma   - currently with increased symptoms   - take your Flovent 2 puffs twice a day everyday at this time   - have access to albuterol inhaler 2 puffs every 4-6 hours as needed for cough/wheeze/shortness of breath/chest tightness.  May use 15-20 minutes prior to activity.   Monitor frequency of use.    Asthma control goals:   Full participation in all desired activities (may need albuterol before activity)  Albuterol use two time or less a week on average (not counting use with activity)  Cough interfering with sleep two time or  less a month  Oral steroids no more than once a year  No hospitalizations  Follow-up 3 months or sooner if needed  I appreciate the opportunity to take part in Anthony Rowe's care. Please do not hesitate to contact me with questions.  Sincerely,   Margo Aye, MD Allergy/Immunology [facility]

## 2019-03-29 NOTE — ED Triage Notes (Signed)
Came in via ems; c/o elevated blood pressure and headache; reported has not taken blood pressure medications for 3 months. Head ache since yesterday. EMS reported pt was picked up from allergist office being seen for itching when the blood pressure read 168/58. Pt is c/o of severe migraine at this time that he is unable to get relief from OTC meds. Pt stated taken some prior to arrival around 1115

## 2019-03-29 NOTE — ED Provider Notes (Signed)
I have personally seen and examined the patient. I have reviewed the documentation on PMH/FH/Soc Hx. I have discussed the plan of care with the resident and patient.  I have reviewed and agree with the resident's documentation. Please see associated encounter note.  Briefly, the patient is a 38 y.o. adult here with headache.  Patient history of migraines.  Patient with slightly worse headache today than normal.  Went to primary doctor and had some decreased responsiveness.  Overall he is neurologically intact here.  EKG shows sinus rhythm.  Patient with generalized headache with some photophobia.  Denies any chest pain, shortness of breath.  Normal neurological exam.  No visual problems.  Lab work reassuring.  Patient has history of hyperthyroidism and free T4 slightly high.  No concern for storm.  Has not been compliant with medication.  Recommend being compliant with medication and follow-up with endocrinology.  CT scan of the head is unremarkable.  UDS and urinalysis also unremarkable.  Patient was given IV Compazine and IV fluids and felt much improved.  Likely migraine headache as a cause of his symptoms today.  No concern for meningitis or infectious process.  Normal vitals.  Discharged in good condition.  Given return precautions.  This chart was dictated using voice recognition software.  Despite best efforts to proofread,  errors can occur which can change the documentation meaning.    EKG Interpretation  Date/Time:  Wednesday Mar 29 2019 17:09:56 EDT Ventricular Rate:  94 PR Interval:    QRS Duration: 80 QT Interval:  339 QTC Calculation: 424 R Axis:   62 Text Interpretation:  Sinus rhythm Confirmed by Virgina Norfolk [phone]) on 03/29/2019 5:47:06 PM        Virgina Norfolk, DO 03/29/19 1932

## 2019-03-29 NOTE — ED Notes (Signed)
Pt's sps. Lowanda Foster [phone]. Pls contact with updates

## 2019-03-29 NOTE — ED Provider Notes (Signed)
[facility]   CSN: [id number] Arrival date & time: 03/29/19  1707    History   Chief Complaint Chief Complaint  Patient presents with  . Hypertension  . Migraine    HPI Anthony Rowe is a 38 y.o. adult.     HPI 38 year old male to male transgender with a history of HTN, migraines, and hyperthyroidism who presents with headache.  Patient was seen in dermatology clinic today due to chronic itching.  Her blood pressure was elevated and she was complaining of 2 days of severe headache.  Patient does have a history of hypertensive urgency/emergency.  She reports taking headache medication yesterday and then a Goody's powder today without improvement.  She has not taken her blood pressure medications or the metoprolol for her hypothyroidism for the past several months.  Denies extremity weakness, numbness, or tingling.  No fever or neck pain.  Past Medical History:  Diagnosis Date  . Asthma   . Bronchitis   . Endometriosis   . Hypertension   . Migraine     Patient Active Problem List   Diagnosis Date Noted  . Migraine 08/12/2018  . HTN (hypertension) 08/12/2018  . Asthma 08/12/2018  . Male-to-male transgender person 08/12/2018  . Hyperthyroidism 08/12/2018  . Left ankle pain 09/25/2013  . Hypovitaminosis D 09/25/2013    Past Surgical History:  Procedure Laterality Date  . ANKLE SURGERY    . MASTECTOMY Bilateral 2017   male to male         Home Medications    Prior to Admission medications   Medication Sig Start Date End Date Taking? Authorizing Provider  albuterol (PROVENTIL HFA;VENTOLIN HFA) 108 (90 Base) MCG/ACT inhaler 2 Puff every 4-6 hours as needed 07/27/18   Marcelyn BruinsPadgett, Shaylar Patricia, MD  alprazolam Prudy Feeler(XANAX) 2 MG tablet Take by mouth.    [provider]  cyclobenzaprine (FLEXERIL) 10 MG tablet Take by mouth. 01/25/13   [provider]  diclofenac sodium (VOLTAREN) 1 % GEL APPLY 4  GRAMS 4 TIMES A DAY AS NEEDED FOR PAIN 07/08/18   [provider]  famotidine (PEPCID) 20 MG tablet Take 1 tablet (20 mg total) by mouth 2 (two) times daily. 08/08/18   Marcelyn BruinsPadgett, Shaylar Patricia, MD  FLOVENT HFA 220 MCG/ACT inhaler TAKE 2 PUFFS BY MOUTH TWICE A DAY 06/28/18   [provider]  fluticasone (FLONASE) 50 MCG/ACT nasal spray Place 2 sprays into both nostrils daily. Patient not taking: Reported on 03/29/2019 09/23/18   Belinda FisherYu, Amy V, PA-C  hydrochlorothiazide (HYDRODIURIL) 25 MG tablet hydrochlorothiazide 25 mg tablet  Take 1 tablet every day by oral route.    [provider]  hydrOXYzine (ATARAX/VISTARIL) 25 MG tablet TAKE 1 TABLET BY MOUTH TWICE A DAY AS NEEDED ITCHING 06/09/18   [provider]  ibuprofen (ADVIL,MOTRIN) 800 MG tablet Take 800-1,600 mg by mouth every 8 (eight) hours as needed for headache, mild pain or moderate pain (migraine).     [provider]  ipratropium (ATROVENT) 0.06 % nasal spray Place 2 sprays into both nostrils 4 (four) times daily. Patient not taking: Reported on 03/29/2019 09/23/18   Belinda FisherYu, Amy V, PA-C  levocetirizine (XYZAL) 5 MG tablet Take 1 tablet (5 mg total) by mouth 2 (two) times daily. Patient not taking: Reported on 03/29/2019 07/27/18   Marcelyn BruinsPadgett, Shaylar Patricia, MD  lisinopril (PRINIVIL,ZESTRIL) 20 MG tablet Take 1 tablet (20 mg total) by mouth daily. Patient not taking: Reported on 03/29/2019 08/06/15  Renne Crigler, PA-C  loratadine (CLARITIN) 10 MG tablet loratadine 10 mg tablet  Take 1 tablet every day by oral route for 30 days.    [provider]  metoprolol succinate (TOPROL-XL) 50 MG 24 hr tablet Take 1 tablet (50 mg total) by mouth daily. Take with or immediately following a meal. Patient not taking: Reported on 03/29/2019 01/09/19   Romero Belling, MD  ondansetron (ZOFRAN-ODT) 4 MG disintegrating tablet ondansetron 4 mg disintegrating tablet  Take 1 tablet 3 times a day by oral route as needed.     [provider]  testosterone cypionate (DEPO-TESTOSTERONE) 100 MG/ML injection 50 mg IM, every 10 days 11/28/18   Romero Belling, MD  tiZANidine (ZANAFLEX) 4 MG tablet Take 4 mg by mouth every 6 (six) hours as needed for muscle spasms.    [provider]  traZODone (DESYREL) 100 MG tablet Take 100 mg by mouth at bedtime as needed for sleep.     [provider]    Family History Family History  Problem Relation Age of Onset  . Cancer Mother        lung  . Hypertension Maternal Grandmother   . Diabetes Maternal Grandmother   . Thyroid disease Neg Hx     Social History Social History   Tobacco Use  . Smoking status: Never Smoker  . Smokeless tobacco: Never Used  Substance Use Topics  . Alcohol use: No  . Drug use: No     Allergies   Frovatriptan and Sulfa antibiotics   Review of Systems Review of Systems  Constitutional: Negative for chills and fever.  HENT: Negative for ear pain and sore throat.   Eyes: Negative for pain and visual disturbance.  Respiratory: Negative for cough and shortness of breath.   Cardiovascular: Negative for chest pain and palpitations.  Gastrointestinal: Negative for abdominal pain, nausea and vomiting.  Genitourinary: Negative for dysuria and hematuria.  Musculoskeletal: Negative for arthralgias and back pain.  Skin: Negative for color change and rash.  Neurological: Positive for headaches. Negative for seizures and syncope.  All other systems reviewed and are negative.    Physical Exam Updated Vital Signs BP 130/66   Pulse 91   Resp (!) 22   SpO2 98%   Physical Exam Vitals signs and nursing note reviewed.  Constitutional:      Appearance: He is well-developed.  HENT:     Head: Normocephalic and atraumatic.  Eyes:     Conjunctiva/sclera: Conjunctivae normal.  Neck:     Musculoskeletal: Neck supple.  Cardiovascular:     Rate and Rhythm: Normal rate and regular rhythm.     Heart sounds: No murmur.   Pulmonary:     Effort: Pulmonary effort is normal. No respiratory distress.     Breath sounds: Normal breath sounds.  Abdominal:     Palpations: Abdomen is soft.     Tenderness: There is no abdominal tenderness.  Musculoskeletal: Normal range of motion.        General: No tenderness.  Skin:    General: Skin is warm and dry.  Neurological:     Mental Status: He is alert.     Comments:  Mental Status:  Orientation: Alert and oriented to person, place, and time.  Memory: Cooperative, follows commands well. Recent and remote memory normal.  Attention, Concentration: Attention span and concentration are normal.  Language: Speech is clear and language is normal.  Fund of Knowledge: Aware of current events, vocabulary appropriate for patient age.  Cranial  Nerves   II Visual Fields:  Intact to confrontation III, IV, VI:  Pupils equal and reactive to light and near. Full eye movement without nystagmus  V Facial Sensation:  Normal. No weakness of masticatory muscles  VII:  No facial weakness or asymmetry  VIII Auditory Acuity:  Grossly normal  IX/X:  The uvula is midline; the palate elevates symmetrically  XI:  Normal sternocleidomastoid and trapezius strength  XII:  The tongue is midline. No atrophy or fasciculations.    Motor System:  Muscle Strength: 5/5 and symmetric in the upper and lower extremities. No pronation or drift.  Muscle Tone: Tone and muscle bulk are normal in the upper and lower extremities.   Reflexes:  DTRs: 2+ and symmetrical in all four extremities. Plantar responses are flexor bilaterally.  Coordination:  Intact finger-to-nose, heel-to-shin, and rapid alternating movements. No tremor.  Sensation:  Intact to light touch Gait:  Routine and tandem gait are normal        ED Treatments / Results  Labs (all labs ordered are listed, but only abnormal results are displayed) Labs Reviewed  COMPREHENSIVE METABOLIC PANEL - Abnormal; Notable for the following components:       Result Value   Potassium 2.9 (*)    Glucose, Bld 109 (*)    Total Protein 5.7 (*)    All other components within normal limits  T4, FREE - Abnormal; Notable for the following components:   Free T4 3.94 (*)    All other components within normal limits  CBC WITH DIFFERENTIAL/PLATELET  RAPID URINE DRUG SCREEN, HOSP PERFORMED  TSH  POC URINE PREG, ED    EKG EKG Interpretation  Date/Time:  Wednesday Mar 29 2019 17:09:56 EDT Ventricular Rate:  94 PR Interval:    QRS Duration: 80 QT Interval:  339 QTC Calculation: 424 R Axis:   62 Text Interpretation:  Sinus rhythm Confirmed by Virgina Norfolk [phone]) on 03/29/2019 5:47:06 PM   Radiology Ct Head Wo Contrast  Result Date: 03/29/2019 CLINICAL DATA:  Altered level of consciousness, headache, hypertension. No reported injury. EXAM: CT HEAD WITHOUT CONTRAST TECHNIQUE: Contiguous axial images were obtained from the base of the skull through the vertex without intravenous contrast. COMPARISON:  None. FINDINGS: Brain: No evidence of parenchymal hemorrhage or extra-axial fluid collection. No mass lesion, mass effect, or midline shift. No CT evidence of acute infarction. Cerebral volume is age appropriate. No ventriculomegaly. Vascular: No acute abnormality. Skull: No evidence of calvarial fracture. Sinuses/Orbits: The visualized paranasal sinuses are essentially clear. Other:  The mastoid air cells are unopacified. IMPRESSION: Negative head CT. No evidence of acute intracranial abnormality. Electronically Signed   By: Delbert Phenix M.D.   On: 03/29/2019 18:40    Procedures Procedures (including critical care time)  Medications Ordered in ED Medications  prochlorperazine (COMPAZINE) injection 10 mg (10 mg Intravenous Given 03/29/19 1851)  lactated ringers bolus 1,000 mL (1,000 mLs Intravenous New Bag/Given 03/29/19 1851)     Initial Impression / Assessment and Plan / ED Course  I have reviewed the triage vital signs and the nursing notes.   Pertinent labs & imaging results that were available during my care of the patient were reviewed by me and considered in my medical decision making (see chart for details).  38 year old male to male transgender with a history of HTN, migraines, and hyperthyroidism who presents with headache.  Patient's blood pressure is 145/88.  She is alert and oriented.  No focal findings on her neurological exam.  Doubt hypertensive  emergency.  EKG normal sinus rhythm with no ischemic changes.  No signs of WPW, Brugada, or HOCM.  No chest pain or shortness of breath.  Doubt ACS.  CBC unremarkable.  CMP notable for potassium of 2.9.  T4 is 3.94.  UDS negative.  He had unremarkable.  Patient's history and physical not consistent with thyroid storm.  Headache treated with bolus of fluids and Compazine.  On reevaluation, patient is resting comfortably.  I recommended that she restart her medications and have close follow-up with her PCP and endocrinologist further management of her hyperthyroidism.  Final Clinical Impressions(s) / ED Diagnoses   Final diagnoses:  Other migraine without status migrainosus, not intractable  Hyperthyroidism    ED Discharge Orders    None       Vallery Ridge, MD 03/29/19 1943    Virgina Norfolk, DO 03/29/19 1944

## 2019-03-29 NOTE — ED Notes (Signed)
Taken to CT.

## 2019-03-29 NOTE — Patient Instructions (Addendum)
Hypertension  - concern for hypertensive urgency with symptoms in the office - HA, blurred vision and pt appeared to have another syncopal event in the office while trying to keep her alert.   He had BP reading in Rt arm as noted in the exam.   Approximately 20 later had a BP reading in Lt arm of 180/120.  He required sternal rubs to maintain alertness.  EMS arrived to transport to ED for further treatment.    - will notify PCP of current events  Chronic itch   -etiology of itch is unknown at this time.  Itch can be caused by a variety of different triggers including illness/infection, foods, medications, stings, exercise, pressure, vibrations, extremes of temperature to name a few however there may be no identifiable trigger.  Opiate medication use (oxycodone) as well as NSAIDs can cause itch as a side effect.    - environmental allergen panel does show detectable IgE to ragweed, tree pollens, cat dander, dog dander.  Also low IgE to milk as well.    - recommend taking either Zyrtec, Xyzal or Allegra 1 tablet twice a day with Pepcid 20mg  1 tablet twice a day   - can use hydroxyzine 25mg  at bedtime as needed for nighttime itch control   - advised that he should continue to get his thyroid disease under control  Asthma   - currently with increased symptoms   - take your Flovent 2 puffs twice a day everyday at this time   - have access to albuterol inhaler 2 puffs every 4-6 hours as needed for cough/wheeze/shortness of breath/chest tightness.  May use 15-20 minutes prior to activity.   Monitor frequency of use.    Asthma control goals:   Full participation in all desired activities (may need albuterol before activity)  Albuterol use two time or less a week on average (not counting use with activity)  Cough interfering with sleep two time or less a month  Oral steroids no more than once a year  No hospitalizations  Follow-up 3 months or sooner if needed

## 2019-03-29 NOTE — ED Notes (Signed)
Spoke w/ wife via phone and update given regarding pt's status. Will call again with further results and pt's disposition.

## 2019-03-30 ENCOUNTER — Telehealth: Payer: Self-pay | Admitting: *Deleted

## 2019-03-30 ENCOUNTER — Other Ambulatory Visit: Payer: Self-pay | Admitting: *Deleted

## 2019-03-30 MED ORDER — HYDROXYZINE HCL 25 MG PO TABS: ORAL_TABLET | ORAL | 5 refills | Status: DC

## 2019-03-30 MED ORDER — LEVOCETIRIZINE DIHYDROCHLORIDE 5 MG PO TABS: 5.0000 mg | ORAL_TABLET | Freq: Two times a day (BID) | ORAL | 5 refills | Status: DC

## 2019-03-30 MED ORDER — FAMOTIDINE 20 MG PO TABS: 20.0000 mg | ORAL_TABLET | Freq: Two times a day (BID) | ORAL | 1 refills | Status: DC

## 2019-03-30 NOTE — Telephone Encounter (Signed)
-----   Message from [facility] Larose Hires, MD sent at 03/30/2019  8:41 AM EDT ----- Regarding: meds Morning.  Can someone refill his allergy medications as per my note since we did not get to do that yesterday?   I reviewed the ED note and appears he was treated for his migraine and w/u was all reassuring and was able to be discharged in better condition.  Please let him know that we will refill meds today for his itching.   Thanks.

## 2019-03-30 NOTE — Telephone Encounter (Signed)
Medications have been refilled for itching. Called patient and informed. Patient verbalized understanding and was very thankful for the assistance he received yesterday.

## 2019-04-13 ENCOUNTER — Other Ambulatory Visit: Payer: Self-pay | Admitting: Endocrinology

## 2019-05-03 ENCOUNTER — Telehealth: Payer: Self-pay | Admitting: Endocrinology

## 2019-05-03 DIAGNOSIS — E0590 Thyrotoxicosis, unspecified without thyrotoxic crisis or storm: Secondary | ICD-10-CM

## 2019-05-03 NOTE — Telephone Encounter (Signed)
Pt is calling regarding approval for dental work. He does have an order in for thyroid scan that Colletta [place] will check on and schedule tomorrow.   Please advise on dental approval for dentures please

## 2019-05-03 NOTE — Telephone Encounter (Signed)
The radioactive iodine rx is now available, but resuming the methimazole is faster.  Which do you prefer?  In either case, thyroid must be better to be cleared.

## 2019-05-03 NOTE — Telephone Encounter (Signed)
Please advise if pt has been cleared to proceed with dentures

## 2019-05-04 NOTE — Addendum Note (Signed)
Addended by: Renato Shin on: 05/04/2019 09:38 AM   Modules accepted: Orders

## 2019-05-04 NOTE — Telephone Encounter (Signed)
This was sent to centralized scheduling for a second time-it does not require a PA so he should be scheduled quickly

## 2019-05-04 NOTE — Telephone Encounter (Signed)
Ok, I ordered the test.  you will receive a phone call, about a day and time for an appointment.  When we get the results, we can order the RAI rx

## 2019-05-04 NOTE — Telephone Encounter (Signed)
Called pt to further discuss. States would rather order RAI tx't. If needed (uncertain if order has been closed out), please re-order so pt can be re-scheduled.

## 2019-05-04 NOTE — Telephone Encounter (Signed)
Uncertain if this is something you schedule or if it goes in to a work que for radiology

## 2019-05-05 ENCOUNTER — Other Ambulatory Visit: Payer: Self-pay | Admitting: Allergy

## 2019-05-12 NOTE — Telephone Encounter (Signed)
Pt has been set up for 05/18/2019

## 2019-05-16 ENCOUNTER — Other Ambulatory Visit: Payer: Self-pay | Admitting: Endocrinology

## 2019-05-16 NOTE — Telephone Encounter (Signed)
Please review and refill if appropriate 

## 2019-05-18 ENCOUNTER — Encounter (HOSPITAL_COMMUNITY)
Admission: RE | Admit: 2019-05-18 | Discharge: 2019-05-18 | Disposition: A | Discharge status: Home / Self Care | Payer: Medicare Other | Source: Ambulatory Visit | Attending: Endocrinology | Admitting: Endocrinology

## 2019-05-18 ENCOUNTER — Other Ambulatory Visit: Payer: Self-pay

## 2019-05-18 DIAGNOSIS — E0590 Thyrotoxicosis, unspecified without thyrotoxic crisis or storm: Secondary | ICD-10-CM | POA: Diagnosis not present

## 2019-05-18 MED ORDER — SODIUM IODIDE I-123 7.4 MBQ CAPS
442.0000 | ORAL_CAPSULE | Freq: Once | ORAL | Status: AC
  Administered 2019-05-18: 442 via ORAL

## 2019-05-19 ENCOUNTER — Encounter (HOSPITAL_COMMUNITY)
Admission: RE | Admit: 2019-05-19 | Discharge: 2019-05-19 | Disposition: A | Discharge status: Home / Self Care | Payer: Medicare Other | Source: Ambulatory Visit | Attending: Endocrinology | Admitting: Endocrinology

## 2019-05-19 ENCOUNTER — Other Ambulatory Visit: Payer: Self-pay | Admitting: Endocrinology

## 2019-05-19 DIAGNOSIS — E0590 Thyrotoxicosis, unspecified without thyrotoxic crisis or storm: Secondary | ICD-10-CM

## 2019-05-26 ENCOUNTER — Telehealth: Payer: Self-pay | Admitting: Endocrinology

## 2019-05-26 NOTE — Telephone Encounter (Signed)
At Dr. Cordelia Pen request, pt has been schedule to be seen in person on 05/29/19

## 2019-05-26 NOTE — Telephone Encounter (Signed)
Please advise 

## 2019-05-26 NOTE — Telephone Encounter (Signed)
An in-person ov wold be needed for this.  Has RAI rx been scheduled for this?

## 2019-05-26 NOTE — Telephone Encounter (Signed)
Patient states that experiencing excessive trembling and that this is escalating.  Wants to know how too proceed and if additional medication is needed.  Needs some directions and would like a call back at [phone]

## 2019-05-29 ENCOUNTER — Ambulatory Visit (INDEPENDENT_AMBULATORY_CARE_PROVIDER_SITE_OTHER): Payer: Medicare Other | Admitting: Endocrinology

## 2019-05-29 ENCOUNTER — Other Ambulatory Visit: Payer: Self-pay

## 2019-05-29 ENCOUNTER — Encounter: Payer: Self-pay | Admitting: Endocrinology

## 2019-05-29 VITALS — BP 128/70 | HR 114 | Temp 98.3°F | Ht 61.61 in | Wt 169.8 lb

## 2019-05-29 DIAGNOSIS — E876 Hypokalemia: Secondary | ICD-10-CM | POA: Diagnosis not present

## 2019-05-29 DIAGNOSIS — E0590 Thyrotoxicosis, unspecified without thyrotoxic crisis or storm: Secondary | ICD-10-CM | POA: Diagnosis not present

## 2019-05-29 DIAGNOSIS — E559 Vitamin D deficiency, unspecified: Secondary | ICD-10-CM | POA: Diagnosis not present

## 2019-05-29 DIAGNOSIS — F640 Transsexualism: Secondary | ICD-10-CM | POA: Diagnosis not present

## 2019-05-29 DIAGNOSIS — Z789 Other specified health status: Secondary | ICD-10-CM

## 2019-05-29 LAB — CBC WITH DIFFERENTIAL/PLATELET
Basophils Absolute: 0 10*3/uL (ref 0.0–0.1)
Basophils Relative: 0.4 % (ref 0.0–3.0)
Eosinophils Absolute: 0 10*3/uL (ref 0.0–0.7)
Eosinophils Relative: 1.1 % (ref 0.0–5.0)
HCT: 44 % (ref 39.0–52.0)
Hemoglobin: 14.7 g/dL (ref 13.0–17.0)
Lymphocytes Relative: 47.6 % — ABNORMAL HIGH (ref 12.0–46.0)
Lymphs Abs: 1.8 10*3/uL (ref 0.7–4.0)
MCHC: 33.4 g/dL (ref 30.0–36.0)
MCV: 80.2 fl (ref 78.0–100.0)
Monocytes Absolute: 0.4 10*3/uL (ref 0.1–1.0)
Monocytes Relative: 10.1 % (ref 3.0–12.0)
Neutro Abs: 1.6 10*3/uL (ref 1.4–7.7)
Neutrophils Relative %: 40.8 % — ABNORMAL LOW (ref 43.0–77.0)
Platelets: 288 10*3/uL (ref 150.0–400.0)
RBC: 5.48 Mil/uL (ref 4.22–5.81)
RDW: 13.7 % (ref 11.5–15.5)
WBC: 3.9 10*3/uL — ABNORMAL LOW (ref 4.0–10.5)

## 2019-05-29 LAB — BASIC METABOLIC PANEL
BUN: 8 mg/dL (ref 6–23)
CO2: 26 mEq/L (ref 19–32)
Calcium: 9.9 mg/dL (ref 8.4–10.5)
Chloride: 107 mEq/L (ref 96–112)
Creatinine, Ser: 0.64 mg/dL (ref 0.40–1.50)
GFR: 168.83 mL/min (ref >60.00)
Glucose, Bld: 105 mg/dL — ABNORMAL HIGH (ref 70–99)
Potassium: 3.5 mEq/L (ref 3.5–5.1)
Sodium: 141 mEq/L (ref 135–145)

## 2019-05-29 LAB — VITAMIN D 25 HYDROXY (VIT D DEFICIENCY, FRACTURES): VITD: 26.99 ng/mL — ABNORMAL LOW (ref 30.00–100.00)

## 2019-05-29 LAB — T4, FREE: Free T4: 3.68 ng/dL — ABNORMAL HIGH (ref 0.60–1.60)

## 2019-05-29 LAB — TSH: TSH: <0.01 u[IU]/mL — ABNORMAL LOW (ref 0.35–4.50)

## 2019-05-29 MED ORDER — METHIMAZOLE 10 MG PO TABS: 40.0000 mg | ORAL_TABLET | Freq: Two times a day (BID) | ORAL | 2 refills | Status: DC

## 2019-05-29 MED ORDER — METOPROLOL SUCCINATE ER 25 MG PO TB24: 75.0000 mg | ORAL_TABLET | Freq: Every day | ORAL | 5 refills | Status: DC

## 2019-05-29 NOTE — Patient Instructions (Addendum)
Blood tests are requested for you today.  We'll let you know about the results.  You will called today to schedule the radioactive iodine pill.   After you take the pill, resume the methimazole the next day.  If ever you have fever while taking methimazole, stop it and call us, even if the reason is obvious, because of the risk of a rare side-effect. Please come back for a follow-up appointment in 1 month.

## 2019-05-29 NOTE — Progress Notes (Signed)
Subjective:    Patient ID: Anthony Rowe, adult    DOB: 02/25/1981, 38 y.o.   MRN: [id number]  HPI Pt is under medical rx for transgender state (F to M; he had breast reduction surgery in 2017; he last went to counseling in 2014; he has been on testosterone since 2015; dosage is limited by polycythemia).  Last dose was 1 month ago.  [facility] says [facility] receive [facility] (was [facility]).  I accessed [facility], and no rx was claimed then.   Pt also has hyperthyroidism (dx'ed 2019; he has never had thyroid imaging; tapazole is chosen as initial rx, due to severity; in 2020, pt chose RAI, so tapazole was stopped).  Pt reports moderate tremor of the hands, and assoc cramps.   Past Medical History:  Diagnosis Date  . Asthma   . Bronchitis   . Endometriosis   . Hypertension   . Migraine     Past Surgical History:  Procedure Laterality Date  . ANKLE SURGERY    . MASTECTOMY Bilateral 2017   male to male     Social History   Socioeconomic History  . Marital status: Married    Spouse name: Not on file  . Number of children: Not on file  . Years of education: Not on file  . Highest education level: Not on file  Occupational History  . Not on file  Social Needs  . Financial resource strain: Not on file  . Food insecurity    Worry: Not on file    Inability: Not on file  . Transportation needs    Medical: Not on file    Non-medical: Not on file  Tobacco Use  . Smoking status: Never Smoker  . Smokeless tobacco: Never Used  Substance and Sexual Activity  . Alcohol use: No  . Drug use: No  . Sexual activity: Not Currently  Lifestyle  . Physical activity    Days per week: Not on file    Minutes per session: Not on file  . Stress: Not on file  Relationships  . Social Musicianconnections    Talks on phone: Not on file    Gets together: Not on file    Attends religious service: Not on file    Active member of club or organization: Not on file    Attends meetings of clubs or  organizations: Not on file    Relationship status: Not on file  . Intimate partner violence    Fear of current or ex partner: Not on file    Emotionally abused: Not on file    Physically abused: Not on file    Forced sexual activity: Not on file  Other Topics Concern  . Not on file  Social History Narrative  . Not on file    Current Outpatient Medications on File Prior to Visit  Medication Sig Dispense Refill  . albuterol (PROVENTIL HFA;VENTOLIN HFA) 108 (90 Base) MCG/ACT inhaler 2 Puff every 4-6 hours as needed 1 Inhaler 1  . alprazolam (XANAX) 2 MG tablet Take by mouth.    . cyclobenzaprine (FLEXERIL) 10 MG tablet Take by mouth.    . diclofenac sodium (VOLTAREN) 1 % GEL APPLY 4 GRAMS 4 TIMES A DAY AS NEEDED FOR PAIN  5  . famotidine (PEPCID) 20 MG tablet Take 1 tablet (20 mg total) by mouth 2 (two) times daily. 180 tablet 1  . FLOVENT HFA 220 MCG/ACT inhaler TAKE 2 PUFFS BY MOUTH TWICE A DAY  5  . hydrochlorothiazide (HYDRODIURIL) 25 MG tablet hydrochlorothiazide 25 mg tablet  Take 1 tablet every day by oral route.    . hydrOXYzine (ATARAX/VISTARIL) 25 MG tablet TAKE 1 TABLET BY MOUTH TWICE A DAY AS NEEDED ITCHING 180 tablet 1  . ibuprofen (ADVIL,MOTRIN) 800 MG tablet Take 800-1,600 mg by mouth every 8 (eight) hours as needed for headache, mild pain or moderate pain (migraine).     Marland Kitchen levocetirizine (XYZAL) 5 MG tablet Take 1 tablet (5 mg total) by mouth 2 (two) times daily. 60 tablet 5  . lisinopril (PRINIVIL,ZESTRIL) 20 MG tablet Take 1 tablet (20 mg total) by mouth daily. 30 tablet 1  . ondansetron (ZOFRAN-ODT) 4 MG disintegrating tablet ondansetron 4 mg disintegrating tablet  Take 1 tablet 3 times a day by oral route as needed.    . testosterone cypionate (DEPOTESTOTERONE CYPIONATE) 100 MG/ML injection INJECT 0.5ML IN THE MUSCLE EVERY 10 DAYS 10 mL 0  . traZODone (DESYREL) 100 MG tablet Take 100 mg by mouth at bedtime as needed for sleep.      No current facility-administered  medications on file prior to visit.     Allergies  Allergen Reactions  . Frovatriptan Other (See Comments)    Numbness in both legs  . Sulfa Antibiotics Other (See Comments)    Makes patients legs numb     Family History  Problem Relation Age of Onset  . Cancer Mother        lung  . Hypertension Maternal Grandmother   . Diabetes Maternal Grandmother   . Thyroid disease Neg Hx     BP 128/70 (BP Location: Left Arm, Patient Position: Sitting, Cuff Size: Normal)   Pulse (!) 114   Temp 98.3 F (36.8 C) (Oral)   Ht 5' 1.61" (1.565 m)   Wt 169 lb 12.8 oz (77 kg)   SpO2 96%   BMI 31.45 kg/m   Review of Systems Palpitations persist. Denies fever    Objective:   Physical Exam VITAL SIGNS:  See vs page GENERAL: no distress NECK: thyroid is 3 times normal size, with irreg surface.  No thyroid nodule is palpable.  No palpable lymphadenopathy at the anterior neck.     Lab Results  Component Value Date   TSH <0.01 (L) 05/29/2019      Assessment & Plan:  Hyperthyroidism: persistent Tachycardia: in this setting, we'll increase toprol pending RAI rx Transgender state: recheck labs today  Patient Instructions  Blood tests are requested for you today.  We'll let you know about the results.  You will called today to schedule the radioactive iodine pill.   After you take the pill, resume the methimazole the next day.  If ever you have fever while taking methimazole, stop it and call us, even if the reason is obvious, because of the risk of a rare side-effect. Please come back for a follow-up appointment in 1 month.

## 2019-05-31 LAB — TESTOSTERONE,FREE AND TOTAL
Testosterone, Free: 6 pg/mL — ABNORMAL LOW (ref 8.7–25.1)
Testosterone: 745 ng/dL (ref 264–916)

## 2019-06-09 ENCOUNTER — Ambulatory Visit (HOSPITAL_COMMUNITY): Admission: RE | Admit: 2019-06-09 | Payer: Medicare Other | Source: Ambulatory Visit

## 2019-06-16 ENCOUNTER — Ambulatory Visit (HOSPITAL_COMMUNITY)
Admission: RE | Admit: 2019-06-16 | Discharge: 2019-06-16 | Disposition: A | Discharge status: Home / Self Care | Payer: Medicare Other | Source: Ambulatory Visit | Attending: Endocrinology | Admitting: Endocrinology

## 2019-06-16 ENCOUNTER — Other Ambulatory Visit: Payer: Self-pay

## 2019-06-16 DIAGNOSIS — E0590 Thyrotoxicosis, unspecified without thyrotoxic crisis or storm: Secondary | ICD-10-CM | POA: Insufficient documentation

## 2019-06-16 LAB — PREGNANCY, URINE: Preg Test, Ur: NEGATIVE

## 2019-06-16 MED ORDER — SODIUM IODIDE I 131 CAPSULE
12.4000 | Freq: Once | INTRAVENOUS | Status: AC | PRN
  Administered 2019-06-16: 12.4 via ORAL

## 2019-06-27 ENCOUNTER — Other Ambulatory Visit: Payer: Self-pay

## 2019-06-30 ENCOUNTER — Other Ambulatory Visit: Payer: Self-pay

## 2019-06-30 ENCOUNTER — Encounter: Payer: Self-pay | Admitting: Endocrinology

## 2019-06-30 ENCOUNTER — Ambulatory Visit (INDEPENDENT_AMBULATORY_CARE_PROVIDER_SITE_OTHER): Payer: Medicare Other | Admitting: Endocrinology

## 2019-06-30 VITALS — BP 114/68 | HR 98 | Ht 61.5 in | Wt 166.8 lb

## 2019-06-30 DIAGNOSIS — E0590 Thyrotoxicosis, unspecified without thyrotoxic crisis or storm: Secondary | ICD-10-CM | POA: Diagnosis not present

## 2019-06-30 LAB — TSH: TSH: <0.01 u[IU]/mL — ABNORMAL LOW (ref 0.35–4.50)

## 2019-06-30 LAB — T4, FREE: Free T4: 1.55 ng/dL (ref 0.60–1.60)

## 2019-06-30 NOTE — Progress Notes (Signed)
Subjective:    Patient ID: Anthony Rowe, adult    DOB: September 15, 1981, 38 y.o.   MRN: [id number]  HPI Pt is under medical rx for transgender state (F to M; he had breast reduction surgery in 2017; he last went to counseling in 2014; he has been on testosterone since 2015; dosage is limited by polycythemia).  Last dose was 1 month ago.  [facility] says [facility] not [facility] (was [facility]).  I accessed [place], and no rx was claimed then.   Pt also has hyperthyroidism (dx'ed 2019; he has never had thyroid imaging; tapazole is chosen as initial rx, due to severity; in 2020, pt chose RAI, so tapazole was stopped).  Pt reports ongoing tremor of the hands.  He is back on tapazole.   Past Medical History:  Diagnosis Date  . Asthma   . Bronchitis   . Endometriosis   . Hypertension   . Migraine     Past Surgical History:  Procedure Laterality Date  . ANKLE SURGERY    . MASTECTOMY Bilateral 2017   male to male     Social History   Socioeconomic History  . Marital status: Married    Spouse name: Not on file  . Number of children: Not on file  . Years of education: Not on file  . Highest education level: Not on file  Occupational History  . Not on file  Social Needs  . Financial resource strain: Not on file  . Food insecurity    Worry: Not on file    Inability: Not on file  . Transportation needs    Medical: Not on file    Non-medical: Not on file  Tobacco Use  . Smoking status: Never Smoker  . Smokeless tobacco: Never Used  Substance and Sexual Activity  . Alcohol use: No  . Drug use: No  . Sexual activity: Not Currently  Lifestyle  . Physical activity    Days per week: Not on file    Minutes per session: Not on file  . Stress: Not on file  Relationships  . Social Herbalist on phone: Not on file    Gets together: Not on file    Attends religious service: Not on file    Active member of club or organization: Not on file    Attends meetings of clubs  or organizations: Not on file    Relationship status: Not on file  . Intimate partner violence    Fear of current or ex partner: Not on file    Emotionally abused: Not on file    Physically abused: Not on file    Forced sexual activity: Not on file  Other Topics Concern  . Not on file  Social History Narrative  . Not on file    Current Outpatient Medications on File Prior to Visit  Medication Sig Dispense Refill  . albuterol (PROVENTIL HFA;VENTOLIN HFA) 108 (90 Base) MCG/ACT inhaler 2 Puff every 4-6 hours as needed 1 Inhaler 1  . alprazolam (XANAX) 2 MG tablet Take by mouth.    . cyclobenzaprine (FLEXERIL) 10 MG tablet Take by mouth.    . diclofenac sodium (VOLTAREN) 1 % GEL APPLY 4 GRAMS 4 TIMES A DAY AS NEEDED FOR PAIN  5  . famotidine (PEPCID) 20 MG tablet Take 1 tablet (20 mg total) by mouth 2 (two) times daily. 180 tablet 1  . FLOVENT HFA 220 MCG/ACT inhaler TAKE 2 PUFFS BY MOUTH TWICE  A DAY  5  . hydrochlorothiazide (HYDRODIURIL) 25 MG tablet hydrochlorothiazide 25 mg tablet  Take 1 tablet every day by oral route.    . hydrOXYzine (ATARAX/VISTARIL) 25 MG tablet TAKE 1 TABLET BY MOUTH TWICE A DAY AS NEEDED ITCHING 180 tablet 1  . ibuprofen (ADVIL,MOTRIN) 800 MG tablet Take 800-1,600 mg by mouth every 8 (eight) hours as needed for headache, mild pain or moderate pain (migraine).     Marland Kitchen. levocetirizine (XYZAL) 5 MG tablet Take 1 tablet (5 mg total) by mouth 2 (two) times daily. 60 tablet 5  . lisinopril (PRINIVIL,ZESTRIL) 20 MG tablet Take 1 tablet (20 mg total) by mouth daily. 30 tablet 1  . methimazole (TAPAZOLE) 10 MG tablet Take 4 tablets (40 mg total) by mouth 2 (two) times daily. 240 tablet 2  . metoprolol succinate (TOPROL-XL) 25 MG 24 hr tablet Take 3 tablets (75 mg total) by mouth daily. 90 tablet 5  . ondansetron (ZOFRAN-ODT) 4 MG disintegrating tablet ondansetron 4 mg disintegrating tablet  Take 1 tablet 3 times a day by oral route as needed.    . testosterone cypionate  (DEPOTESTOTERONE CYPIONATE) 100 MG/ML injection INJECT 0.5ML IN THE MUSCLE EVERY 10 DAYS 10 mL 0  . traZODone (DESYREL) 100 MG tablet Take 100 mg by mouth at bedtime as needed for sleep.      No current facility-administered medications on file prior to visit.     Allergies  Allergen Reactions  . Frovatriptan Other (See Comments)    Numbness in both legs  . Sulfa Antibiotics Other (See Comments)    Makes patients legs numb     Family History  Problem Relation Age of Onset  . Cancer Mother        lung  . Hypertension Maternal Grandmother   . Diabetes Maternal Grandmother   . Thyroid disease Neg Hx     BP 114/68 (BP Location: Left Arm, Patient Position: Sitting, Cuff Size: Large)   Pulse 98   Ht 5' 1.5" (1.562 m)   Wt 166 lb 12.8 oz (75.7 kg)   SpO2 98%   BMI 31.01 kg/m    Review of Systems Denies fever.     Objective:   Physical Exam VITAL SIGNS:  See vs page GENERAL: no distress.   NECK: thyroid is 3 times normal size, and firm.  No palpable nodule.   Lab Results  Component Value Date   TSH <0.01 (L) 06/30/2019      Assessment & Plan:  Hyperthyroidism: not improved yet Tachycardia: in this setting, he needs to take tapazole while RAI is working.   Patient Instructions  Blood tests are requested for you today.  We'll let you know about the results.   If ever you have fever while taking methimazole, stop it and call us, even if the reason is obvious, because of the risk of a rare side-effect.   Please come back for a follow-up appointment in 1 month.

## 2019-06-30 NOTE — Patient Instructions (Addendum)
Blood tests are requested for you today.  We'll let you know about the results.  If ever you have fever while taking methimazole, stop it and call us, even if the reason is obvious, because of the risk of a rare side-effect.   Please come back for a follow-up appointment in 1 month.   

## 2019-07-14 ENCOUNTER — Encounter (HOSPITAL_COMMUNITY): Payer: Self-pay

## 2019-07-14 ENCOUNTER — Other Ambulatory Visit: Payer: Self-pay

## 2019-07-14 ENCOUNTER — Ambulatory Visit (HOSPITAL_COMMUNITY)
Admission: EM | Admit: 2019-07-14 | Discharge: 2019-07-14 | Disposition: A | Discharge status: Home / Self Care | Payer: Medicare Other | Attending: Emergency Medicine | Admitting: Emergency Medicine

## 2019-07-14 DIAGNOSIS — J069 Acute upper respiratory infection, unspecified: Secondary | ICD-10-CM | POA: Insufficient documentation

## 2019-07-14 DIAGNOSIS — Z79899 Other long term (current) drug therapy: Secondary | ICD-10-CM | POA: Insufficient documentation

## 2019-07-14 DIAGNOSIS — Z8249 Family history of ischemic heart disease and other diseases of the circulatory system: Secondary | ICD-10-CM | POA: Insufficient documentation

## 2019-07-14 DIAGNOSIS — Z833 Family history of diabetes mellitus: Secondary | ICD-10-CM | POA: Diagnosis not present

## 2019-07-14 DIAGNOSIS — Z885 Allergy status to narcotic agent status: Secondary | ICD-10-CM | POA: Diagnosis not present

## 2019-07-14 DIAGNOSIS — Z882 Allergy status to sulfonamides status: Secondary | ICD-10-CM | POA: Insufficient documentation

## 2019-07-14 DIAGNOSIS — I10 Essential (primary) hypertension: Secondary | ICD-10-CM | POA: Diagnosis not present

## 2019-07-14 DIAGNOSIS — Z9013 Acquired absence of bilateral breasts and nipples: Secondary | ICD-10-CM | POA: Diagnosis not present

## 2019-07-14 DIAGNOSIS — Z801 Family history of malignant neoplasm of trachea, bronchus and lung: Secondary | ICD-10-CM | POA: Insufficient documentation

## 2019-07-14 DIAGNOSIS — Z7951 Long term (current) use of inhaled steroids: Secondary | ICD-10-CM | POA: Insufficient documentation

## 2019-07-14 DIAGNOSIS — Z1159 Encounter for screening for other viral diseases: Secondary | ICD-10-CM

## 2019-07-14 DIAGNOSIS — Z791 Long term (current) use of non-steroidal anti-inflammatories (NSAID): Secondary | ICD-10-CM | POA: Diagnosis not present

## 2019-07-14 DIAGNOSIS — Z20828 Contact with and (suspected) exposure to other viral communicable diseases: Secondary | ICD-10-CM | POA: Diagnosis not present

## 2019-07-14 DIAGNOSIS — J45909 Unspecified asthma, uncomplicated: Secondary | ICD-10-CM | POA: Insufficient documentation

## 2019-07-14 MED ORDER — KETOROLAC TROMETHAMINE 60 MG/2ML IM SOLN
INTRAMUSCULAR | Status: AC
  Filled 2019-07-14: qty 2

## 2019-07-14 MED ORDER — FLUTICASONE PROPIONATE 50 MCG/ACT NA SUSP: 1.0000 | Freq: Every day | NASAL | 0 refills | Status: DC

## 2019-07-14 MED ORDER — KETOROLAC TROMETHAMINE 60 MG/2ML IM SOLN
60.0000 mg | Freq: Once | INTRAMUSCULAR | Status: AC
  Administered 2019-07-14: 60 mg via INTRAMUSCULAR

## 2019-07-14 NOTE — ED Provider Notes (Signed)
Anthony Rowe    CSN: [id number] Arrival date & time: 07/14/19  1900      History   Chief Complaint Chief Complaint  Patient presents with  . Chills  . Headache  . Nasal Congestion    HPI Troye Kapur is a 38 y.o. adult.   Bran Bertoni presents with complaints of runny nose, migraine headache, sore throat, ear pain, weakness, chills. Started yesterday. Aleve hasn't helped. mucinex hasn't helped. No cough. No gi symptoms. History of migraines. No known ill contacts, but works at [employer]. No chest pain  Or shortness of breath . No loss of taste or smell. Aleve last yesterday. None today. No known fevers. History  Of asthma, hasn't had to use inhaler. History of htn, migraines, endometriosis, asthma.    ROS per HPI, negative if not otherwise mentioned.      Past Medical History:  Diagnosis Date  . Asthma   . Bronchitis   . Endometriosis   . Hypertension   . Migraine     Patient Active Problem List   Diagnosis Date Noted  . Hypokalemia 05/29/2019  . Migraine 08/12/2018  . HTN (hypertension) 08/12/2018  . Asthma 08/12/2018  . Male-to-male transgender person 08/12/2018  . Hyperthyroidism 08/12/2018  . Left ankle pain 09/25/2013  . Hypovitaminosis D 09/25/2013    Past Surgical History:  Procedure Laterality Date  . ANKLE SURGERY    . MASTECTOMY Bilateral 2017   male to male        Home Medications    Prior to Admission medications   Medication Sig Start Date End Date Taking? Authorizing Provider  guaiFENesin (MUCINEX) 600 MG 12 hr tablet Take by mouth 2 (two) times daily.   Yes [provider]  naproxen sodium (ALEVE) 220 MG tablet Take 220 mg by mouth.   Yes [provider]  Pseudoeph-Doxylamine-DM-APAP (NYQUIL PO) Take by mouth.   Yes [provider]  albuterol (PROVENTIL HFA;VENTOLIN HFA) 108 (90 Base) MCG/ACT inhaler 2 Puff every 4-6 hours as needed 07/27/18   Kennith Gain, MD  alprazolam Duanne Moron)  2 MG tablet Take by mouth.    [provider]  cyclobenzaprine (FLEXERIL) 10 MG tablet Take by mouth. 01/25/13   [provider]  diclofenac sodium (VOLTAREN) 1 % GEL APPLY 4 GRAMS 4 TIMES A DAY AS NEEDED FOR PAIN 07/08/18   [provider]  famotidine (PEPCID) 20 MG tablet Take 1 tablet (20 mg total) by mouth 2 (two) times daily. 03/30/19   Kennith Gain, MD  FLOVENT HFA 220 MCG/ACT inhaler TAKE 2 PUFFS BY MOUTH TWICE A DAY 06/28/18   [provider]  fluticasone (FLONASE) 50 MCG/ACT nasal spray Place 1 spray into both nostrils daily. 07/14/19   Zigmund Gottron, NP  hydrochlorothiazide (HYDRODIURIL) 25 MG tablet hydrochlorothiazide 25 mg tablet  Take 1 tablet every day by oral route.    [provider]  hydrOXYzine (ATARAX/VISTARIL) 25 MG tablet TAKE 1 TABLET BY MOUTH TWICE A DAY AS NEEDED ITCHING 05/08/19   Padgett, Rae Halsted, MD  ibuprofen (ADVIL,MOTRIN) 800 MG tablet Take 800-1,600 mg by mouth every 8 (eight) hours as needed for headache, mild pain or moderate pain (migraine).     [provider]  levocetirizine (XYZAL) 5 MG tablet Take 1 tablet (5 mg total) by mouth 2 (two) times daily. 03/30/19   Kennith Gain, MD  lisinopril (PRINIVIL,ZESTRIL) 20 MG tablet Take 1 tablet (20 mg total) by mouth daily. 08/06/15   Carlisle Cater,  PA-C  methimazole (TAPAZOLE) 10 MG tablet Take 4 tablets (40 mg total) by mouth 2 (two) times daily. 05/29/19   Romero Belling, MD  metoprolol succinate (TOPROL-XL) 25 MG 24 hr tablet Take 3 tablets (75 mg total) by mouth daily. 05/29/19   Romero Belling, MD  ondansetron (ZOFRAN-ODT) 4 MG disintegrating tablet ondansetron 4 mg disintegrating tablet  Take 1 tablet 3 times a day by oral route as needed.    [provider]  testosterone cypionate (DEPOTESTOTERONE CYPIONATE) 100 MG/ML injection INJECT 0.5ML IN THE MUSCLE EVERY 10 DAYS 05/16/19   Romero Belling, MD  traZODone (DESYREL) 100 MG  tablet Take 100 mg by mouth at bedtime as needed for sleep.     [provider]    Family History Family History  Problem Relation Age of Onset  . Cancer Mother        lung  . Hypertension Maternal Grandmother   . Diabetes Maternal Grandmother   . Thyroid disease Neg Hx     Social History Social History   Tobacco Use  . Smoking status: Never Smoker  . Smokeless tobacco: Never Used  Substance Use Topics  . Alcohol use: No  . Drug use: No     Allergies   Frovatriptan and Sulfa antibiotics   Review of Systems Review of Systems   Physical Exam Triage Vital Signs ED Triage Vitals  Enc Vitals Group     BP 07/14/19 1954 128/84     Pulse Rate 07/14/19 1954 100     Resp --      Temp 07/14/19 1954 98.4 F (36.9 C)     Temp Source 07/14/19 1954 Oral     SpO2 07/14/19 1954 96 %     Weight --      Height --      Head Circumference --      Peak Flow --      Pain Score 07/14/19 1950 0     Pain Loc --      Pain Edu? --      Excl. in GC? --    No data found.  Updated Vital Signs BP 128/84 (BP Location: Left Arm)   Pulse 100   Temp 98.4 F (36.9 C) (Oral)   SpO2 96%   Visual Acuity Right Eye Distance:   Left Eye Distance:   Bilateral Distance:    Right Eye Near:   Left Eye Near:    Bilateral Near:     Physical Exam Constitutional:      General: He is not in acute distress.    Appearance: He is well-developed.  HENT:     Nose: Rhinorrhea present.     Left Turbinates: Enlarged.  Cardiovascular:     Rate and Rhythm: Normal rate and regular rhythm.     Heart sounds: Normal heart sounds.  Pulmonary:     Effort: Pulmonary effort is normal.     Breath sounds: Normal breath sounds. No wheezing.  Skin:    General: Skin is warm and dry.  Neurological:     Mental Status: He is alert and oriented to person, place, and time.      UC Treatments / Results  Labs (all labs ordered are listed, but only abnormal results are displayed) Labs Reviewed   NOVEL CORONAVIRUS, NAA (HOSP ORDER, SEND-OUT TO REF LAB; TAT 18-24 HRS)    EKG   Radiology No results found.  Procedures Procedures (including critical care time)  Medications Ordered in UC Medications  ketorolac (  TORADOL) injection 60 mg (60 mg Intramuscular Given 07/14/19 2029)  ketorolac (TORADOL) 60 MG/2ML injection (has no administration in time range)    Initial Impression / Assessment and Plan / UC Course  I have reviewed the triage vital signs and the nursing notes.  Pertinent labs & imaging results that were available during my care of the patient were reviewed by me and considered in my medical decision making (see chart for details).     Non toxic. Benign physical exam.  No increased work of breathing. Lungs clear. History and physical consistent with viral illness. Supportive cares recommended. toradol for migraine provided here in clinic. covid testing pending with isolation precautions provided. Return precautions provided. Patient verbalized understanding and agreeable to plan.   Final Clinical Impressions(s) / UC Diagnoses   Final diagnoses:  Upper respiratory tract infection, unspecified type     Discharge Instructions     Push fluids to ensure adequate hydration and keep secretions thin.  Tylenol and/or ibuprofen as needed for pain or fevers.  Don't take additional ibuprofen or aleve until tomorrow.  Continue with mucinex to loosen secretions.  Please start daily flonase as I expect this to help with your sinus symptoms.  Self isolate until covid results are back and negative.  Will notify you of any positive findings. You may monitor your results on your MyChart online as well.    If symptoms worsen or do not improve in the next week to return to be seen or to follow up with your PCP.      ED Prescriptions    Medication Sig Dispense Auth. Provider   fluticasone (FLONASE) 50 MCG/ACT nasal spray Place 1 spray into both nostrils daily. 16 g Georgetta HaberBurky,  Natalie B, NP     Controlled Substance Prescriptions [place] Controlled Substance Registry consulted? Not Applicable   Georgetta HaberBurky, Natalie B, NP 07/15/19 [phone]

## 2019-07-14 NOTE — ED Triage Notes (Addendum)
Patient states he started having headache, runny nose and chills for 1 day. He is taking Aleve, Mucinex and Nyquil, he states none of this medications are helping him.

## 2019-07-14 NOTE — Discharge Instructions (Signed)
Push fluids to ensure adequate hydration and keep secretions thin.  Tylenol and/or ibuprofen as needed for pain or fevers.  Don't take additional ibuprofen or aleve until tomorrow.  Continue with mucinex to loosen secretions.  Please start daily flonase as I expect this to help with your sinus symptoms.  Self isolate until covid results are back and negative.  Will notify you of any positive findings. You may monitor your results on your MyChart online as well.    If symptoms worsen or do not improve in the next week to return to be seen or to follow up with your PCP.

## 2019-07-16 LAB — NOVEL CORONAVIRUS, NAA (HOSP ORDER, SEND-OUT TO REF LAB; TAT 18-24 HRS): SARS-CoV-2, NAA: NOT DETECTED

## 2019-07-18 ENCOUNTER — Other Ambulatory Visit: Payer: Self-pay

## 2019-07-18 ENCOUNTER — Ambulatory Visit (HOSPITAL_COMMUNITY)
Admission: EM | Admit: 2019-07-18 | Discharge: 2019-07-18 | Disposition: A | Discharge status: Home / Self Care | Payer: Medicare Other | Attending: Urgent Care | Admitting: Urgent Care

## 2019-07-18 ENCOUNTER — Encounter (HOSPITAL_COMMUNITY): Payer: Self-pay | Admitting: Emergency Medicine

## 2019-07-18 DIAGNOSIS — I10 Essential (primary) hypertension: Secondary | ICD-10-CM

## 2019-07-18 DIAGNOSIS — R0981 Nasal congestion: Secondary | ICD-10-CM

## 2019-07-18 DIAGNOSIS — B349 Viral infection, unspecified: Secondary | ICD-10-CM | POA: Diagnosis not present

## 2019-07-18 DIAGNOSIS — E860 Dehydration: Secondary | ICD-10-CM | POA: Diagnosis not present

## 2019-07-18 DIAGNOSIS — R112 Nausea with vomiting, unspecified: Secondary | ICD-10-CM

## 2019-07-18 DIAGNOSIS — R197 Diarrhea, unspecified: Secondary | ICD-10-CM | POA: Diagnosis not present

## 2019-07-18 DIAGNOSIS — R030 Elevated blood-pressure reading, without diagnosis of hypertension: Secondary | ICD-10-CM

## 2019-07-18 DIAGNOSIS — E0590 Thyrotoxicosis, unspecified without thyrotoxic crisis or storm: Secondary | ICD-10-CM

## 2019-07-18 DIAGNOSIS — R000 Tachycardia, unspecified: Secondary | ICD-10-CM

## 2019-07-18 MED ORDER — LOPERAMIDE HCL 2 MG PO CAPS: 2.0000 mg | ORAL_CAPSULE | Freq: Every day | ORAL | 0 refills | Status: DC | PRN

## 2019-07-18 MED ORDER — ONDANSETRON 8 MG PO TBDP: 8.0000 mg | ORAL_TABLET | Freq: Three times a day (TID) | ORAL | 0 refills | Status: DC | PRN

## 2019-07-18 MED ORDER — ONDANSETRON HCL 4 MG/2ML IJ SOLN
4.0000 mg | Freq: Once | INTRAMUSCULAR | Status: AC
  Administered 2019-07-18: 4 mg via INTRAVENOUS

## 2019-07-18 MED ORDER — ONDANSETRON HCL 4 MG/2ML IJ SOLN
INTRAMUSCULAR | Status: AC
  Filled 2019-07-18: qty 2

## 2019-07-18 MED ORDER — SODIUM CHLORIDE 0.9 % IV BOLUS
1000.0000 mL | Freq: Once | INTRAVENOUS | Status: AC
  Administered 2019-07-18: 1000 mL via INTRAVENOUS

## 2019-07-18 NOTE — Discharge Instructions (Addendum)
We will manage this as a viral syndrome. For sore throat or cough try using a honey-based tea. Use 3 teaspoons of honey with juice squeezed from half lemon. Place shaved pieces of ginger into 1/2-1 cup of water and warm over stove top. Then mix the ingredients and repeat every 4 hours as needed. Please take Tylenol 500mg-650mg every 6 hours. Hydrate very well with at least 2 liters of water. Eat light meals such as soups to replenish electrolytes and soft fruits, veggies. Start an antihistamine like Zyrtec, Allegra or Claritin for postnasal drainage, sinus congestion.   °

## 2019-07-18 NOTE — ED Triage Notes (Signed)
Pt sts continued diarrhea and nasal congestion; pt sts neg covid test from visit last Friday

## 2019-07-18 NOTE — ED Provider Notes (Signed)
MRN: [id number] DOB: 05-Dec-1980  Subjective:   Coden Jerene PitchFoy is a 38 y.o. adult presenting for persistent nausea, vomiting and diarrhea.  Today her bowel movements slowed down but has had 8-10 bowel movements per day especially over the weekend.  She is also had multiple daily episodes of vomiting.  She still has some headaches and some mild congestion.  COVID testing from 07/14/2019 was negative.  She received IM Toradol in clinic for headache which patient states did not help.  She has been using Flonase nasal spray intermittently, was prescribed to her at her last OV.  Patient is also male to male transgender, is taking testosterone injections.  She has been managed by Dr. Everardo AllEllison for hyperthyroidism, currently is not taking any medications for this.  She has also had some mild coughing and shortness of breath but has not been using her albuterol inhaler to help with this.  Unfortunately due to her symptoms, patient has not had an appetite and has not taken her medicines.  She is also not been able to eat as much.  Chart review shows multiple EKGs with sinus rhythm and no arrhythmia.   No current facility-administered medications for this encounter.   Current Outpatient Medications:  .  albuterol (PROVENTIL HFA;VENTOLIN HFA) 108 (90 Base) MCG/ACT inhaler, 2 Puff every 4-6 hours as needed, Disp: 1 Inhaler, Rfl: 1 .  alprazolam (XANAX) 2 MG tablet, Take by mouth., Disp: , Rfl:  .  cyclobenzaprine (FLEXERIL) 10 MG tablet, Take by mouth., Disp: , Rfl:  .  diclofenac sodium (VOLTAREN) 1 % GEL, APPLY 4 GRAMS 4 TIMES A DAY AS NEEDED FOR PAIN, Disp: , Rfl: 5 .  famotidine (PEPCID) 20 MG tablet, Take 1 tablet (20 mg total) by mouth 2 (two) times daily., Disp: 180 tablet, Rfl: 1 .  FLOVENT HFA 220 MCG/ACT inhaler, TAKE 2 PUFFS BY MOUTH TWICE A DAY, Disp: , Rfl: 5 .  fluticasone (FLONASE) 50 MCG/ACT nasal spray, Place 1 spray into both nostrils daily., Disp: 16 g, Rfl: 0 .  guaiFENesin (MUCINEX) 600 MG  12 hr tablet, Take by mouth 2 (two) times daily., Disp: , Rfl:  .  hydrochlorothiazide (HYDRODIURIL) 25 MG tablet, hydrochlorothiazide 25 mg tablet  Take 1 tablet every day by oral route., Disp: , Rfl:  .  hydrOXYzine (ATARAX/VISTARIL) 25 MG tablet, TAKE 1 TABLET BY MOUTH TWICE A DAY AS NEEDED ITCHING, Disp: 180 tablet, Rfl: 1 .  ibuprofen (ADVIL,MOTRIN) 800 MG tablet, Take 800-1,600 mg by mouth every 8 (eight) hours as needed for headache, mild pain or moderate pain (migraine). , Disp: , Rfl:  .  levocetirizine (XYZAL) 5 MG tablet, Take 1 tablet (5 mg total) by mouth 2 (two) times daily., Disp: 60 tablet, Rfl: 5 .  lisinopril (PRINIVIL,ZESTRIL) 20 MG tablet, Take 1 tablet (20 mg total) by mouth daily., Disp: 30 tablet, Rfl: 1 .  methimazole (TAPAZOLE) 10 MG tablet, Take 4 tablets (40 mg total) by mouth 2 (two) times daily., Disp: 240 tablet, Rfl: 2 .  metoprolol succinate (TOPROL-XL) 25 MG 24 hr tablet, Take 3 tablets (75 mg total) by mouth daily., Disp: 90 tablet, Rfl: 5 .  naproxen sodium (ALEVE) 220 MG tablet, Take 220 mg by mouth., Disp: , Rfl:  .  ondansetron (ZOFRAN-ODT) 4 MG disintegrating tablet, ondansetron 4 mg disintegrating tablet  Take 1 tablet 3 times a day by oral route as needed., Disp: , Rfl:  .  Pseudoeph-Doxylamine-DM-APAP (NYQUIL PO), Take by mouth., Disp: , Rfl:  .  testosterone cypionate (DEPOTESTOTERONE CYPIONATE) 100 MG/ML injection, INJECT 0.5ML IN THE MUSCLE EVERY 10 DAYS, Disp: 10 mL, Rfl: 0 .  traZODone (DESYREL) 100 MG tablet, Take 100 mg by mouth at bedtime as needed for sleep. , Disp: , Rfl:     Allergies  Allergen Reactions  . Frovatriptan Other (See Comments)    Numbness in both legs  . Sulfa Antibiotics Other (See Comments)    Makes patients legs numb     Past Medical History:  Diagnosis Date  . Asthma   . Bronchitis   . Endometriosis   . Hypertension   . Migraine      Past Surgical History:  Procedure Laterality Date  . ANKLE SURGERY    .  MASTECTOMY Bilateral 2017   male to male     Family History  Problem Relation Age of Onset  . Cancer Mother        lung  . Hypertension Maternal Grandmother   . Diabetes Maternal Grandmother   . Thyroid disease Neg Hx     Social History   Tobacco Use  . Smoking status: Never Smoker  . Smokeless tobacco: Never Used  Substance Use Topics  . Alcohol use: No  . Drug use: No    ROS Denies confusion, dizziness, throat pain, chest pain, heart racing, palpitations, history of arrhythmia including atrial fibrillation.  Objective:   Vitals: BP (!) 168/84 (BP Location: Right Arm)   Pulse (!) 129   Temp 97.9 F (36.6 C) (Tympanic)   Resp 16   SpO2 100%   BP Readings from Last 3 Encounters:  07/18/19 (!) 168/84  07/14/19 128/84  06/30/19 114/68   BP was 133/58 and pulse was 120 on recheck by PA-Royann Wildasin at 13:34, left arm, seated position.  Pulse was 112 on recheck by PA Avi Kerschner at 13: 08.  Physical Exam Constitutional:      General: He is not in acute distress.    Appearance: Normal appearance. He is well-developed. He is not ill-appearing, toxic-appearing or diaphoretic.  HENT:     Head: Normocephalic and atraumatic.     Nose: Congestion and rhinorrhea present.     Mouth/Throat:     Mouth: Mucous membranes are dry.     Pharynx: No oropharyngeal exudate or posterior oropharyngeal erythema.  Eyes:     Extraocular Movements: Extraocular movements intact.     Pupils: Pupils are equal, round, and reactive to light.  Neck:     Musculoskeletal: Normal range of motion and neck supple. No muscular tenderness.  Cardiovascular:     Rate and Rhythm: Regular rhythm. Tachycardia present.     Pulses: Normal pulses.     Heart sounds: Normal heart sounds. No murmur. No friction rub. No gallop.   Pulmonary:     Effort: Pulmonary effort is normal. No respiratory distress.     Breath sounds: Normal breath sounds. No stridor. No wheezing, rhonchi or rales.  Abdominal:     General: Bowel  sounds are normal. There is no distension.     Palpations: Abdomen is soft. There is no mass.     Tenderness: There is no abdominal tenderness. There is no right CVA tenderness, left CVA tenderness, guarding or rebound.  Musculoskeletal:     Right lower leg: No edema.     Left lower leg: No edema.  Lymphadenopathy:     Cervical: No cervical adenopathy.  Skin:    General: Skin is warm and dry.     Findings: No rash.  Neurological:  Mental Status: He is alert and oriented to person, place, and time.  Psychiatric:        Mood and Affect: Mood normal.        Behavior: Behavior normal.        Thought Content: Thought content normal.        Judgment: Judgment normal.    1 L IV fluids with 4 mg IV Zofran given while in clinic.  Patient refused medication for headache.  Assessment and Plan :   1. Viral syndrome   2. Nausea and vomiting, intractability of vomiting not specified, unspecified vomiting type   3. Diarrhea, unspecified type   4. Tachycardia   5. Hyperthyroidism   6. Elevated blood pressure reading   7. Essential hypertension   8. Dehydration   9. Nasal congestion     Counseled patient on supportive care for viral illness.  Emphasized need for medical compliance especially with her beta-blocker.  Patient felt better after fluids and IV Zofran.  Provided patient with work note including documentation of negative COVID-19 result at her request.  Patient is to follow-up as needed. Counseled patient on potential for adverse effects with medications prescribed/recommended today, ER and return-to-clinic precautions discussed, patient verbalized understanding.    Jaynee Eagles, [place] 07/18/19 1514

## 2019-07-25 ENCOUNTER — Other Ambulatory Visit: Payer: Self-pay

## 2019-07-26 ENCOUNTER — Encounter: Payer: Self-pay | Admitting: Allergy

## 2019-07-26 ENCOUNTER — Ambulatory Visit (INDEPENDENT_AMBULATORY_CARE_PROVIDER_SITE_OTHER): Payer: Medicare Other | Admitting: Endocrinology

## 2019-07-26 ENCOUNTER — Encounter: Payer: Self-pay | Admitting: Endocrinology

## 2019-07-26 ENCOUNTER — Ambulatory Visit (INDEPENDENT_AMBULATORY_CARE_PROVIDER_SITE_OTHER): Payer: Medicare Other | Admitting: Allergy

## 2019-07-26 VITALS — BP 132/78 | HR 118 | Temp 98.6°F | Resp 16 | Ht 62.5 in | Wt 166.0 lb

## 2019-07-26 VITALS — BP 134/78 | HR 118 | Ht 61.5 in | Wt 166.2 lb

## 2019-07-26 DIAGNOSIS — J4530 Mild persistent asthma, uncomplicated: Secondary | ICD-10-CM

## 2019-07-26 DIAGNOSIS — E0590 Thyrotoxicosis, unspecified without thyrotoxic crisis or storm: Secondary | ICD-10-CM

## 2019-07-26 DIAGNOSIS — L299 Pruritus, unspecified: Secondary | ICD-10-CM | POA: Diagnosis not present

## 2019-07-26 MED ORDER — HYDROXYZINE HCL 25 MG PO TABS: 25.0000 mg | ORAL_TABLET | Freq: Every evening | ORAL | 5 refills | Status: DC | PRN

## 2019-07-26 MED ORDER — METOPROLOL SUCCINATE ER 100 MG PO TB24: 100.0000 mg | ORAL_TABLET | Freq: Every day | ORAL | 3 refills | Status: DC

## 2019-07-26 MED ORDER — TRIAMCINOLONE ACETONIDE 0.1 % EX CREA: 1.0000 "application " | TOPICAL_CREAM | Freq: Four times a day (QID) | CUTANEOUS | 2 refills | Status: DC

## 2019-07-26 MED ORDER — MONTELUKAST SODIUM 10 MG PO TABS: 10.0000 mg | ORAL_TABLET | Freq: Every day | ORAL | 5 refills | Status: DC

## 2019-07-26 MED ORDER — FAMOTIDINE 20 MG PO TABS: 20.0000 mg | ORAL_TABLET | Freq: Two times a day (BID) | ORAL | 5 refills | Status: DC

## 2019-07-26 NOTE — Progress Notes (Signed)
Subjective:    Patient ID: Anthony Rowe, adult    DOB: 02/28/1981, 38 y.o.   MRN: [id number]  HPI Pt is under medical rx for transgender state (F to M; he had breast reduction surgery in 2017; he last went to counseling in 2014; he has been on testosterone since 2015; dosage is limited by polycythemia).   Pt also has hyperthyroidism (dx'ed 2019; he has never had thyroid imaging; tapazole is chosen as initial rx, due to severity; in 06/2019, pt had RAI rx).  Pt reports ongoing tremor of the hands.  He has moderate itching throughout the body, but no assoc fever.  He has not recently taken tapazole.   Past Medical History:  Diagnosis Date  . Asthma   . Bronchitis   . Endometriosis   . Hypertension   . Migraine     Past Surgical History:  Procedure Laterality Date  . ANKLE SURGERY    . MASTECTOMY Bilateral 2017   male to male     Social History   Socioeconomic History  . Marital status: Married    Spouse name: Not on file  . Number of children: Not on file  . Years of education: Not on file  . Highest education level: Not on file  Occupational History  . Not on file  Social Needs  . Financial resource strain: Not on file  . Food insecurity    Worry: Not on file    Inability: Not on file  . Transportation needs    Medical: Not on file    Non-medical: Not on file  Tobacco Use  . Smoking status: Never Smoker  . Smokeless tobacco: Never Used  Substance and Sexual Activity  . Alcohol use: No  . Drug use: No  . Sexual activity: Not Currently  Lifestyle  . Physical activity    Days per week: Not on file    Minutes per session: Not on file  . Stress: Not on file  Relationships  . Social Herbalist on phone: Not on file    Gets together: Not on file    Attends religious service: Not on file    Active member of club or organization: Not on file    Attends meetings of clubs or organizations: Not on file    Relationship status: Not on file  . Intimate  partner violence    Fear of current or ex partner: Not on file    Emotionally abused: Not on file    Physically abused: Not on file    Forced sexual activity: Not on file  Other Topics Concern  . Not on file  Social History Narrative  . Not on file    Current Outpatient Medications on File Prior to Visit  Medication Sig Dispense Refill  . albuterol (PROVENTIL HFA;VENTOLIN HFA) 108 (90 Base) MCG/ACT inhaler 2 Puff every 4-6 hours as needed 1 Inhaler 1  . cyclobenzaprine (FLEXERIL) 10 MG tablet Take by mouth.    . famotidine (PEPCID) 20 MG tablet Take 1 tablet (20 mg total) by mouth 2 (two) times daily. 180 tablet 1  . FLOVENT HFA 220 MCG/ACT inhaler TAKE 2 PUFFS BY MOUTH TWICE A DAY  5  . fluticasone (FLONASE) 50 MCG/ACT nasal spray Place 1 spray into both nostrils daily. (Patient not taking: Reported on 07/26/2019) 16 g 0  . hydrochlorothiazide (HYDRODIURIL) 25 MG tablet hydrochlorothiazide 25 mg tablet  Take 1 tablet every day by oral route.    Marland Kitchen ibuprofen (  ADVIL,MOTRIN) 800 MG tablet Take 800-1,600 mg by mouth every 8 (eight) hours as needed for headache, mild pain or moderate pain (migraine).     Marland Kitchen levocetirizine (XYZAL) 5 MG tablet Take 1 tablet (5 mg total) by mouth 2 (two) times daily. (Patient not taking: Reported on 07/26/2019) 60 tablet 5  . lisinopril (PRINIVIL,ZESTRIL) 20 MG tablet Take 1 tablet (20 mg total) by mouth daily. 30 tablet 1  . loperamide (IMODIUM) 2 MG capsule Take 1 capsule (2 mg total) by mouth daily as needed for diarrhea or loose stools. 10 capsule 0  . ondansetron (ZOFRAN-ODT) 8 MG disintegrating tablet Take 1 tablet (8 mg total) by mouth every 8 (eight) hours as needed for nausea or vomiting. 30 tablet 0  . testosterone cypionate (DEPOTESTOTERONE CYPIONATE) 100 MG/ML injection INJECT 0.5ML IN THE MUSCLE EVERY 10 DAYS 10 mL 0  . traZODone (DESYREL) 100 MG tablet Take 100 mg by mouth at bedtime as needed for sleep.     . famotidine (PEPCID) 20 MG tablet Take 1  tablet (20 mg total) by mouth 2 (two) times daily. 60 tablet 5  . hydrOXYzine (ATARAX/VISTARIL) 25 MG tablet Take 1-2 tablets (25-50 mg total) by mouth at bedtime as needed. 60 tablet 5  . montelukast (SINGULAIR) 10 MG tablet Take 1 tablet (10 mg total) by mouth at bedtime. 30 tablet 5   No current facility-administered medications on file prior to visit.     Allergies  Allergen Reactions  . Frovatriptan Other (See Comments)    Numbness in both legs  . Sulfa Antibiotics Other (See Comments)    Makes patients legs numb     Family History  Problem Relation Age of Onset  . Cancer Mother        lung  . Hypertension Maternal Grandmother   . Diabetes Maternal Grandmother   . Thyroid disease Neg Hx     BP 134/78 (BP Location: Left Arm, Patient Position: Sitting, Cuff Size: Large)   Pulse (!) 118   Ht 5' 1.5" (1.562 m)   Wt 166 lb 3.2 oz (75.4 kg)   SpO2 98%   BMI 30.89 kg/m    Review of Systems He has slight tremor and palpitations.     Objective:   Physical Exam VITAL SIGNS:  See vs page GENERAL: no distress NECK: There is no palpable thyroid enlargement.  No thyroid nodule is palpable.  No palpable lymphadenopathy at the anterior neck. SKIN: minimal micropolypapular rash on the extremities.        Assessment & Plan:  Hyperthyroidism: we can skip TFT today, as he is clinically hyperthyroid.  Itching, new.  Caused or exac by the hyperthyroidism.  It is unlikely caused by tapazole, but he does not wish to resume.    Patient Instructions  I have sent 2 prescriptions to your pharmacy: for an anti-itch cream, and to increase the metoprolol. Please come back for a follow-up appointment in 1 month.

## 2019-07-26 NOTE — Progress Notes (Signed)
Follow-up Note  RE: Anthony Rowe MRN: [id number] DOB: 06-Feb-1981 Date of Office Visit: 07/26/2019   History of present illness: Anthony Rowe is a 38 y.o. adult presenting today for follow-up of pruritus.  He was last seen in the office on 03/29/2019 by myself.  At this visit he was found to have elevated BP (history of HTN) and had been his medications and was having signs of hypertensive urgency and at episode in office where he briefly was not responsive.  EMS was called and he was taken to ED for evaluation.  He had been complaining of migraines that entire day and even reported he had a syncopal event earlier in the day.  He was treated in ED with migraine cocktail and fluids and his BP was improved.   This past week he reports having GI symptoms with diarrhea, nausea and abdominal pain.  He was seen in the ED on 07/18/2019 for this and treated with IVF.   He had negative Covid testing on 07/14/2019 after presenting to UC with viral like symptoms.    Hyperthyroidism is improving with medication management.  BP control also improved with taking his medications.   He states he has been extremely itchy over the past several days to the point that he has not been to work due to the extreme itch.  He is carrying around a back scratcher to reach everywhere due to the itch.  The hydroxyzine he states is not helping.  He is also taking famoatidine.   Not taking a long-acting antihistamine.    Review of systems: Review of Systems  Constitutional: Positive for malaise/fatigue. Negative for chills and fever.  HENT: Positive for congestion. Negative for ear discharge, nosebleeds and sinus pain.   Eyes: Negative for pain, discharge and redness.  Respiratory: Positive for cough. Negative for shortness of breath and wheezing.   Cardiovascular: Negative.   Gastrointestinal: Positive for abdominal pain, diarrhea, nausea and vomiting.  Musculoskeletal: Negative for joint pain.  Skin: Positive for  itching. Negative for rash.  Neurological: Positive for headaches.    All other systems negative unless noted above in HPI  Past medical/social/surgical/family history have been reviewed and are unchanged unless specifically indicated below.  No changes  Medication List: Allergies as of 07/26/2019      Reactions   Frovatriptan Other (See Comments)   Numbness in both legs   Sulfa Antibiotics Other (See Comments)   Makes patients legs numb       Medication List       Accurate as of July 26, 2019 12:11 PM. If you have any questions, ask your nurse or doctor.        STOP taking these medications   alprazolam 2 MG tablet Commonly known as: XANAX Stopped by: Shaylar Larose Hires, MD   diclofenac sodium 1 % Gel Commonly known as: VOLTAREN Stopped by: Shaylar Larose Hires, MD   guaiFENesin 600 MG 12 hr tablet Commonly known as: MUCINEX Stopped by: Shaylar Larose Hires, MD   hydrOXYzine 25 MG tablet Commonly known as: ATARAX/VISTARIL Stopped by: Shaylar Larose Hires, MD   methimazole 10 MG tablet Commonly known as: TAPAZOLE Stopped by: Romero Belling, MD   naproxen sodium 220 MG tablet Commonly known as: ALEVE Stopped by: Shaylar Larose Hires, MD   NYQUIL PO Stopped by: Shaylar Larose Hires, MD     TAKE these medications   albuterol 108 (90 Base) MCG/ACT inhaler Commonly known as: VENTOLIN HFA 2 Puff every 4-6 hours as needed  cyclobenzaprine 10 MG tablet Commonly known as: FLEXERIL Take by mouth.   famotidine 20 MG tablet Commonly known as: Pepcid Take 1 tablet (20 mg total) by mouth 2 (two) times daily.   Flovent HFA 220 MCG/ACT inhaler Generic drug: fluticasone TAKE 2 PUFFS BY MOUTH TWICE A DAY   fluticasone 50 MCG/ACT nasal spray Commonly known as: FLONASE Place 1 spray into both nostrils daily.   hydrochlorothiazide 25 MG tablet Commonly known as: HYDRODIURIL hydrochlorothiazide 25 mg tablet  Take 1 tablet every day  by oral route.   ibuprofen 800 MG tablet Commonly known as: ADVIL Take 800-1,600 mg by mouth every 8 (eight) hours as needed for headache, mild pain or moderate pain (migraine).   levocetirizine 5 MG tablet Commonly known as: XYZAL Take 1 tablet (5 mg total) by mouth 2 (two) times daily.   lisinopril 20 MG tablet Commonly known as: ZESTRIL Take 1 tablet (20 mg total) by mouth daily.   loperamide 2 MG capsule Commonly known as: IMODIUM Take 1 capsule (2 mg total) by mouth daily as needed for diarrhea or loose stools.   metoprolol succinate 100 MG 24 hr tablet Commonly known as: TOPROL-XL Take 1 tablet (100 mg total) by mouth daily. Take with or immediately following a meal. What changed:   medication strength  how much to take  additional instructions Changed by: Renato Shin, MD   ondansetron 8 MG disintegrating tablet Commonly known as: ZOFRAN-ODT Take 1 tablet (8 mg total) by mouth every 8 (eight) hours as needed for nausea or vomiting.   testosterone cypionate 100 MG/ML injection Commonly known as: DEPOTESTOTERONE CYPIONATE INJECT 0.5ML IN THE MUSCLE EVERY 10 DAYS   traZODone 100 MG tablet Commonly known as: DESYREL Take 100 mg by mouth at bedtime as needed for sleep.   triamcinolone cream 0.1 % Commonly known as: KENALOG Apply 1 application topically 4 (four) times daily. As needed for itching Started by: Renato Shin, MD       Known medication allergies: Allergies  Allergen Reactions  . Frovatriptan Other (See Comments)    Numbness in both legs  . Sulfa Antibiotics Other (See Comments)    Makes patients legs numb      Physical examination: Blood pressure 132/78, pulse (!) 118, temperature 98.6 F (37 C), temperature source Temporal, resp. rate 16, height 5' 2.5" (1.588 m), weight 166 lb (75.3 kg), SpO2 98 %.  General: Alert, interactive, scratching all over throughout exam. HEENT: PERRLA, TMs pearly gray, turbinates mildly edematous without  discharge, post-pharynx non erythematous. Neck: Supple without lymphadenopathy. Lungs: Clear to auscultation without wheezing, rhonchi or rales. {no increased work of breathing. CV: Normal S1, S2 without murmurs. Abdomen: Nondistended, nontender. Skin: Warm and dry, without lesions or rashes. Extremities:  No clubbing, cyanosis or edema. Neuro:   Grossly intact.  Diagnositics/Labs: None today  Assessment and plan:   Chronic itch   - current flare of itch most likely related to recent illnesses (respiratory and GI).  Itch can be caused by a variety of different triggers including illness/infection, foods, medications, stings, exercise, pressure, vibrations, extremes of temperature to name a few however there may be no identifiable trigger.    - environmental allergen panel does show detectable IgE to ragweed, tree pollens, cat dander, dog dander.  Also low IgE to milk as well.  Allergen avoidance measures have been provided.     - recommend taking either Zyrtec, Xyzal or Allegra 1 tablet twice a day with Pepcid 20mg  1 tablet twice a  day   - start Singulair 10mg  daily in evenings   - can use hydroxyzine 25mg  (up to 50mg ) at bedtime as needed for nighttime itch control   - due to severity of itch will provide with 3 day course of prednisone 20mg  (first dose given in office).     - recommend to take regimen above antihistamines + singulair a week prior to dental surgery to help prevent worsening itch related to trauma stress of surgery.  Asthma   - continue  Flovent 2 puffs twice a day everyday at this time   - singulair also has helps with asthma control   - have access to albuterol inhaler 2 puffs every 4-6 hours as needed for cough/wheeze/shortness of breath/chest tightness.  May use 15-20 minutes prior to activity.   Monitor frequency of use.    Asthma control goals:   Full participation in all desired activities (may need albuterol before activity)  Albuterol use two time or  less a week on average (not counting use with activity)  Cough interfering with sleep two time or less a month  Oral steroids no more than once a year  No hospitalizations  Follow-up 4-6 months or sooner if needed  I appreciate the opportunity to take part in Zhi's care. Please do not hesitate to contact me with questions.  Sincerely,   Margo Aye, MD Allergy/Immunology [facility]

## 2019-07-26 NOTE — Patient Instructions (Addendum)
Chronic itch   - current flare of itch most likely related to recent illnesses (respiratory and GI).  Itch can be caused by a variety of different triggers including illness/infection, foods, medications, stings, exercise, pressure, vibrations, extremes of temperature to name a few however there may be no identifiable trigger.    - environmental allergen panel does show detectable IgE to ragweed, tree pollens, cat dander, dog dander.  Also low IgE to milk as well.  Allergen avoidance measures have been provided.     - recommend taking either Zyrtec, Xyzal or Allegra 1 tablet twice a day with Pepcid 20mg  1 tablet twice a day   - start Singulair 10mg  daily in evenings   - can use hydroxyzine 25mg  (up to 50mg ) at bedtime as needed for nighttime itch control   - due to severity of itch will provide with 3 day course of prednisone 20mg  (first dose given in office).     - recommend to take regimen above antihistamines + singulair a week prior to dental surgery to help prevent worsening itch related to trauma stress of surgery.  Asthma   - continue  Flovent 251mcg 2 puffs twice a day everyday at this time   - singulair also has helps with asthma control   - have access to albuterol inhaler 2 puffs every 4-6 hours as needed for cough/wheeze/shortness of breath/chest tightness.  May use 15-20 minutes prior to activity.   Monitor frequency of use.    Asthma control goals:   Full participation in all desired activities (may need albuterol before activity)  Albuterol use two time or less a week on average (not counting use with activity)  Cough interfering with sleep two time or less a month  Oral steroids no more than once a year  No hospitalizations  Follow-up 4-6 months or sooner if needed

## 2019-07-26 NOTE — Patient Instructions (Signed)
I have sent 2 prescriptions to your pharmacy: for an anti-itch cream, and to increase the metoprolol. Please come back for a follow-up appointment in 1 month.

## 2019-07-28 ENCOUNTER — Ambulatory Visit: Payer: Medicare Other | Admitting: Endocrinology

## 2019-08-25 ENCOUNTER — Other Ambulatory Visit: Payer: Self-pay

## 2019-08-25 ENCOUNTER — Ambulatory Visit (INDEPENDENT_AMBULATORY_CARE_PROVIDER_SITE_OTHER): Payer: Medicare Other | Admitting: Endocrinology

## 2019-08-25 ENCOUNTER — Encounter: Payer: Self-pay | Admitting: Endocrinology

## 2019-08-25 ENCOUNTER — Telehealth: Payer: Self-pay

## 2019-08-25 VITALS — BP 124/60 | HR 97 | Ht 62.5 in | Wt 176.0 lb

## 2019-08-25 DIAGNOSIS — E0590 Thyrotoxicosis, unspecified without thyrotoxic crisis or storm: Secondary | ICD-10-CM | POA: Diagnosis not present

## 2019-08-25 NOTE — Progress Notes (Signed)
Subjective:    Patient ID: Anthony Rowe, adult    DOB: [birth year], 38 y.o.   MRN: [id number]  HPI Pt is under medical rx for transgender state (F to M; he had breast reduction surgery in 2017; he last went to counseling in 2014; he has been on testosterone since 2015; dosage is limited by polycythemia).   Pt also has hyperthyroidism (dx'ed 2019; he has never had thyroid imaging; tapazole is chosen as initial rx, due to severity; in 06/2019, pt had RAI rx).   pt states he feels well in general, except for intermitt palpitations.  Pt requests clearance for dental procedure Past Medical History:  Diagnosis Date  . Asthma   . Bronchitis   . Endometriosis   . Hypertension   . Migraine     Past Surgical History:  Procedure Laterality Date  . ANKLE SURGERY    . MASTECTOMY Bilateral 2017   male to male     Social History   Socioeconomic History  . Marital status: Married    Spouse name: Not on file  . Number of children: Not on file  . Years of education: Not on file  . Highest education level: Not on file  Occupational History  . Not on file  Social Needs  . Financial resource strain: Not on file  . Food insecurity    Worry: Not on file    Inability: Not on file  . Transportation needs    Medical: Not on file    Non-medical: Not on file  Tobacco Use  . Smoking status: Never Smoker  . Smokeless tobacco: Never Used  Substance and Sexual Activity  . Alcohol use: No  . Drug use: No  . Sexual activity: Not Currently  Lifestyle  . Physical activity    Days per week: Not on file    Minutes per session: Not on file  . Stress: Not on file  Relationships  . Social Herbalist on phone: Not on file    Gets together: Not on file    Attends religious service: Not on file    Active member of club or organization: Not on file    Attends meetings of clubs or organizations: Not on file    Relationship status: Not on file  . Intimate partner violence    Fear of  current or ex partner: Not on file    Emotionally abused: Not on file    Physically abused: Not on file    Forced sexual activity: Not on file  Other Topics Concern  . Not on file  Social History Narrative  . Not on file    Current Outpatient Medications on File Prior to Visit  Medication Sig Dispense Refill  . albuterol (PROVENTIL HFA;VENTOLIN HFA) 108 (90 Base) MCG/ACT inhaler 2 Puff every 4-6 hours as needed 1 Inhaler 1  . cyclobenzaprine (FLEXERIL) 10 MG tablet Take by mouth.    . famotidine (PEPCID) 20 MG tablet Take 1 tablet (20 mg total) by mouth 2 (two) times daily. 180 tablet 1  . famotidine (PEPCID) 20 MG tablet Take 1 tablet (20 mg total) by mouth 2 (two) times daily. 60 tablet 5  . FLOVENT HFA 220 MCG/ACT inhaler TAKE 2 PUFFS BY MOUTH TWICE A DAY  5  . fluticasone (FLONASE) 50 MCG/ACT nasal spray Place 1 spray into both nostrils daily. 16 g 0  . hydrochlorothiazide (HYDRODIURIL) 25 MG tablet hydrochlorothiazide 25 mg tablet  Take 1 tablet every day by  oral route.    . hydrOXYzine (ATARAX/VISTARIL) 25 MG tablet Take 1-2 tablets (25-50 mg total) by mouth at bedtime as needed. 60 tablet 5  . ibuprofen (ADVIL,MOTRIN) 800 MG tablet Take 800-1,600 mg by mouth every 8 (eight) hours as needed for headache, mild pain or moderate pain (migraine).     Marland Kitchen levocetirizine (XYZAL) 5 MG tablet Take 1 tablet (5 mg total) by mouth 2 (two) times daily. 60 tablet 5  . lisinopril (PRINIVIL,ZESTRIL) 20 MG tablet Take 1 tablet (20 mg total) by mouth daily. 30 tablet 1  . loperamide (IMODIUM) 2 MG capsule Take 1 capsule (2 mg total) by mouth daily as needed for diarrhea or loose stools. 10 capsule 0  . metoprolol succinate (TOPROL-XL) 100 MG 24 hr tablet Take 1 tablet (100 mg total) by mouth daily. Take with or immediately following a meal. 30 tablet 3  . montelukast (SINGULAIR) 10 MG tablet Take 1 tablet (10 mg total) by mouth at bedtime. 30 tablet 5  . ondansetron (ZOFRAN-ODT) 8 MG disintegrating  tablet Take 1 tablet (8 mg total) by mouth every 8 (eight) hours as needed for nausea or vomiting. 30 tablet 0  . testosterone cypionate (DEPOTESTOTERONE CYPIONATE) 100 MG/ML injection INJECT 0.5ML IN THE MUSCLE EVERY 10 DAYS 10 mL 0  . traZODone (DESYREL) 100 MG tablet Take 100 mg by mouth at bedtime as needed for sleep.     Marland Kitchen triamcinolone cream (KENALOG) 0.1 % Apply 1 application topically 4 (four) times daily. As needed for itching 45 g 2   No current facility-administered medications on file prior to visit.     Allergies  Allergen Reactions  . Frovatriptan Other (See Comments)    Numbness in both legs  . Sulfa Antibiotics Other (See Comments)    Makes patients legs numb     Family History  Problem Relation Age of Onset  . Cancer Mother        lung  . Hypertension Maternal Grandmother   . Diabetes Maternal Grandmother   . Thyroid disease Neg Hx     BP 124/60 (BP Location: Left Arm, Patient Position: Sitting, Cuff Size: Normal)   Pulse 97   Ht 5' 2.5" (1.588 m)   Wt 176 lb (79.8 kg)   SpO2 95%   BMI 31.68 kg/m    Review of Systems Denies tremor    Objective:   Physical Exam VITAL SIGNS:  See vs page GENERAL: no distress NECK: There is no palpable thyroid enlargement.  No thyroid nodule is palpable.  No palpable lymphadenopathy at the anterior neck.       Assessment & Plan:  Hyperthyroidism: not better yet Dental problem: if pt needs urgently, procedure should be done without clearance.  if not, thyroid clearance can't be given yet.    Patient Instructions  As your heart rate is high today despite the metoprolol, we can skip the blood tests today.   If the dentist needs clearance from the thyroid standpoint to pull 2 teeth, we can't do the clearance until your thyroid is better.   Please come back for a follow-up appointment in 1 month.

## 2019-08-25 NOTE — Patient Instructions (Addendum)
As your heart rate is high today despite the metoprolol, we can skip the blood tests today.   If the dentist needs clearance from the thyroid standpoint to pull 2 teeth, we can't do the clearance until your thyroid is better.   Please come back for a follow-up appointment in 1 month.

## 2019-09-07 NOTE — Telephone Encounter (Signed)
Made in error

## 2019-09-25 ENCOUNTER — Telehealth: Payer: Self-pay | Admitting: Endocrinology

## 2019-09-25 NOTE — Telephone Encounter (Signed)
According to your last office not, clearance could not be provided until thyroid condition had improved. Please advise if pt requires an appt to further discuss/address.

## 2019-09-25 NOTE — Telephone Encounter (Signed)
Patient requests to be called at ph# [phone] asap re: medical/dental conditions. Dental office sent Authorizations forms in February but have not received the forms back-now patient has an abscess and has to have dental surgery.

## 2019-09-25 NOTE — Telephone Encounter (Signed)
Needs ov next available--this week would be best

## 2019-09-26 ENCOUNTER — Encounter: Payer: Self-pay | Admitting: Endocrinology

## 2019-09-26 ENCOUNTER — Other Ambulatory Visit: Payer: Self-pay

## 2019-09-26 ENCOUNTER — Ambulatory Visit (INDEPENDENT_AMBULATORY_CARE_PROVIDER_SITE_OTHER): Payer: Medicare Other | Admitting: Endocrinology

## 2019-09-26 VITALS — BP 110/58 | HR 62 | Ht 62.5 in | Wt 184.6 lb

## 2019-09-26 DIAGNOSIS — E0590 Thyrotoxicosis, unspecified without thyrotoxic crisis or storm: Secondary | ICD-10-CM | POA: Diagnosis not present

## 2019-09-26 DIAGNOSIS — F640 Transsexualism: Secondary | ICD-10-CM

## 2019-09-26 DIAGNOSIS — Z789 Other specified health status: Secondary | ICD-10-CM

## 2019-09-26 LAB — T4, FREE: Free T4: 0.1 ng/dL — ABNORMAL LOW (ref 0.60–1.60)

## 2019-09-26 LAB — CBC WITH DIFFERENTIAL/PLATELET
Basophils Absolute: 0 10*3/uL (ref 0.0–0.1)
Basophils Relative: 1.1 % (ref 0.0–3.0)
Eosinophils Absolute: 0.1 10*3/uL (ref 0.0–0.7)
Eosinophils Relative: 2 % (ref 0.0–5.0)
HCT: 45.5 % (ref 39.0–52.0)
Hemoglobin: 15.4 g/dL (ref 13.0–17.0)
Lymphocytes Relative: 47.7 % — ABNORMAL HIGH (ref 12.0–46.0)
Lymphs Abs: 1.9 10*3/uL (ref 0.7–4.0)
MCHC: 33.9 g/dL (ref 30.0–36.0)
MCV: 89 fl (ref 78.0–100.0)
Monocytes Absolute: 0.2 10*3/uL (ref 0.1–1.0)
Monocytes Relative: 5.6 % (ref 3.0–12.0)
Neutro Abs: 1.8 10*3/uL (ref 1.4–7.7)
Neutrophils Relative %: 43.6 % (ref 43.0–77.0)
Platelets: 308 10*3/uL (ref 150.0–400.0)
RBC: 5.12 Mil/uL (ref 4.22–5.81)
RDW: 18.7 % — ABNORMAL HIGH (ref 11.5–15.5)
WBC: 4.1 10*3/uL (ref 4.0–10.5)

## 2019-09-26 LAB — TSH: TSH: 36.53 u[IU]/mL — ABNORMAL HIGH (ref 0.35–4.50)

## 2019-09-26 MED ORDER — LEVOTHYROXINE SODIUM 100 MCG PO TABS: 100.0000 ug | ORAL_TABLET | Freq: Every day | ORAL | 1 refills | Status: DC

## 2019-09-26 NOTE — Telephone Encounter (Signed)
Please refer to Dr. Ellison's response 

## 2019-09-26 NOTE — Patient Instructions (Signed)
Blood tests are requested for you today.  We'll let you know about the results.   Based on the results, we'll clear you for the dental work if we can.

## 2019-09-26 NOTE — Telephone Encounter (Signed)
Patient is scheduled for 09/26/19 at 11:00 a.m.

## 2019-09-26 NOTE — Progress Notes (Signed)
Subjective:    Patient ID: Anthony Rowe, adult    DOB: Sep 02, 1981, 38 y.o.   MRN: [id number]  HPI Pt is under medical rx for transgender state (F to M; he had breast reduction surgery in 2017; he last went to counseling in 2014; he has been on testosterone since 2015; dosage is limited by polycythemia).   Pt also has hyperthyroidism (dx'ed 2019; tapazole was chosen as initial rx, due to severity; in 06/2019, pt had RAI rx).   pt states he feels well in general  Pt requests clearance for dental procedure.   Past Medical History:  Diagnosis Date  . Asthma   . Bronchitis   . Endometriosis   . Hypertension   . Migraine     Past Surgical History:  Procedure Laterality Date  . ANKLE SURGERY    . MASTECTOMY Bilateral 2017   male to male     Social History   Socioeconomic History  . Marital status: Married    Spouse name: Not on file  . Number of children: Not on file  . Years of education: Not on file  . Highest education level: Not on file  Occupational History  . Not on file  Social Needs  . Financial resource strain: Not on file  . Food insecurity    Worry: Not on file    Inability: Not on file  . Transportation needs    Medical: Not on file    Non-medical: Not on file  Tobacco Use  . Smoking status: Never Smoker  . Smokeless tobacco: Never Used  Substance and Sexual Activity  . Alcohol use: No  . Drug use: No  . Sexual activity: Not Currently  Lifestyle  . Physical activity    Days per week: Not on file    Minutes per session: Not on file  . Stress: Not on file  Relationships  . Social Herbalist on phone: Not on file    Gets together: Not on file    Attends religious service: Not on file    Active member of club or organization: Not on file    Attends meetings of clubs or organizations: Not on file    Relationship status: Not on file  . Intimate partner violence    Fear of current or ex partner: Not on file    Emotionally abused: Not on  file    Physically abused: Not on file    Forced sexual activity: Not on file  Other Topics Concern  . Not on file  Social History Narrative  . Not on file    Current Outpatient Medications on File Prior to Visit  Medication Sig Dispense Refill  . albuterol (PROVENTIL HFA;VENTOLIN HFA) 108 (90 Base) MCG/ACT inhaler 2 Puff every 4-6 hours as needed 1 Inhaler 1  . cyclobenzaprine (FLEXERIL) 10 MG tablet Take by mouth.    . famotidine (PEPCID) 20 MG tablet Take 1 tablet (20 mg total) by mouth 2 (two) times daily. 180 tablet 1  . famotidine (PEPCID) 20 MG tablet Take 1 tablet (20 mg total) by mouth 2 (two) times daily. 60 tablet 5  . FLOVENT HFA 220 MCG/ACT inhaler TAKE 2 PUFFS BY MOUTH TWICE A DAY  5  . fluticasone (FLONASE) 50 MCG/ACT nasal spray Place 1 spray into both nostrils daily. 16 g 0  . hydrochlorothiazide (HYDRODIURIL) 25 MG tablet hydrochlorothiazide 25 mg tablet  Take 1 tablet every day by oral route.    . hydrOXYzine (ATARAX/VISTARIL)  25 MG tablet Take 1-2 tablets (25-50 mg total) by mouth at bedtime as needed. 60 tablet 5  . ibuprofen (ADVIL,MOTRIN) 800 MG tablet Take 800-1,600 mg by mouth every 8 (eight) hours as needed for headache, mild pain or moderate pain (migraine).     Marland Kitchen levocetirizine (XYZAL) 5 MG tablet Take 1 tablet (5 mg total) by mouth 2 (two) times daily. 60 tablet 5  . lisinopril (PRINIVIL,ZESTRIL) 20 MG tablet Take 1 tablet (20 mg total) by mouth daily. 30 tablet 1  . loperamide (IMODIUM) 2 MG capsule Take 1 capsule (2 mg total) by mouth daily as needed for diarrhea or loose stools. 10 capsule 0  . metoprolol succinate (TOPROL-XL) 100 MG 24 hr tablet Take 1 tablet (100 mg total) by mouth daily. Take with or immediately following a meal. 30 tablet 3  . montelukast (SINGULAIR) 10 MG tablet Take 1 tablet (10 mg total) by mouth at bedtime. 30 tablet 5  . ondansetron (ZOFRAN-ODT) 8 MG disintegrating tablet Take 1 tablet (8 mg total) by mouth every 8 (eight) hours as  needed for nausea or vomiting. 30 tablet 0  . testosterone cypionate (DEPOTESTOTERONE CYPIONATE) 100 MG/ML injection INJECT 0.5ML IN THE MUSCLE EVERY 10 DAYS 10 mL 0  . traZODone (DESYREL) 100 MG tablet Take 100 mg by mouth at bedtime as needed for sleep.     Marland Kitchen triamcinolone cream (KENALOG) 0.1 % Apply 1 application topically 4 (four) times daily. As needed for itching 45 g 2   No current facility-administered medications on file prior to visit.     Allergies  Allergen Reactions  . Frovatriptan Other (See Comments)    Numbness in both legs  . Sulfa Antibiotics Other (See Comments)    Makes patients legs numb     Family History  Problem Relation Age of Onset  . Cancer Mother        lung  . Hypertension Maternal Grandmother   . Diabetes Maternal Grandmother   . Thyroid disease Neg Hx     BP (!) 110/58 (BP Location: Right Arm, Patient Position: Sitting, Cuff Size: Normal)   Pulse 62   Ht 5' 2.5" (1.588 m)   Wt 184 lb 9.6 oz (83.7 kg)   SpO2 99%   BMI 33.23 kg/m    Review of Systems Denies palpitations, sob, and tremor.      Objective:   Physical Exam VITAL SIGNS:  See vs page GENERAL: no distress NECK: thyroid is slightly enlarged, with several palpable nodules   Lab Results  Component Value Date   TSH 36.53 (H) 09/26/2019      Assessment & Plan:  Post-RAI hypothyroidism: I have sent a prescription to your pharmacy, for synthroid  Patient Instructions  Blood tests are requested for you today.  We'll let you know about the results.   Based on the results, we'll clear you for the dental work if we can.

## 2019-09-27 ENCOUNTER — Telehealth: Payer: Self-pay

## 2019-09-27 NOTE — Telephone Encounter (Signed)
Called pt and gave him MD message. He verbalized understanding of this and stated that there was some paperwork that the oral surgeon had faxed to this office. Do you have this paperwork?

## 2019-09-27 NOTE — Telephone Encounter (Signed)
-----   Message from Renato Shin, MD sent at 09/26/2019  7:49 PM EST ----- please contact patient: Thyroid has gone low.  I have sent a prescription to your pharmacy, for a thyroid hormone pill You are cleared for the dental surgery.  I hope this helps.

## 2019-09-27 NOTE — Telephone Encounter (Signed)
I do not have.  I also checked.  We had it in the past, but I have not seen recently.

## 2019-09-28 LAB — TESTOSTERONE,FREE AND TOTAL
Testosterone, Free: 15.4 pg/mL (ref 8.7–25.1)
Testosterone: 361 ng/dL (ref 264–916)

## 2019-10-02 NOTE — Telephone Encounter (Signed)
Spoke with pt he is going to call the oral surgeon for the new clearance paperwork to be sent to [place]. The last one sent was from February

## 2019-10-04 ENCOUNTER — Telehealth: Payer: Self-pay

## 2019-10-04 LAB — ESTRADIOL, FREE
Estradiol, Free: 1.07 pg/mL — ABNORMAL HIGH
Estradiol: 45 pg/mL — ABNORMAL HIGH

## 2019-10-04 NOTE — Telephone Encounter (Signed)
Company: [employer]  Document: Surgical Clearance Other records requested: None  All above requested information has been faxed successfully to [employer] listed above. Documents and fax confirmation have been placed in the faxed file for future reference.

## 2019-10-06 ENCOUNTER — Other Ambulatory Visit: Payer: Self-pay | Admitting: Endocrinology

## 2019-10-06 ENCOUNTER — Telehealth: Payer: Self-pay

## 2019-10-06 MED ORDER — ELAGOLIX SODIUM 150 MG PO TABS: 150.0000 mg | ORAL_TABLET | Freq: Every day | ORAL | 5 refills | Status: DC

## 2019-10-06 NOTE — Telephone Encounter (Signed)
-----   Message from Renato Shin, MD sent at 10/06/2019 12:10 PM EST ----- please contact patient: The estrogen is still too much.  Another pill called "elagolix" would help. OK to prescribe?

## 2019-10-06 NOTE — Telephone Encounter (Signed)
Lab results reviewed by Dr. Loanne Drilling. Called pt to inform about lab results and to inquire about adding a new medication. Education provided about medication. Pt has agreed to add this medication to the current regimen. Advised I would forward this message to Dr. Loanne Drilling so that the Rx can be sent as requested. Pt asked that the Rx be sent to [facility]. Using closed-loop communication, pt verbalized complete acceptance and understanding of all information provided. No further questions nor concerns were voiced at this time.

## 2019-10-10 ENCOUNTER — Telehealth: Payer: Self-pay

## 2019-10-10 NOTE — Telephone Encounter (Signed)
Patient came in needing a letter generated in the same form as the letter I placed in your box. I also informed patient of the 7-10 day business turn around.   Please advise if needed

## 2019-10-10 NOTE — Telephone Encounter (Signed)
Called pt to inquire further. States she is driving but will call back with this information. Will await returned call.

## 2019-10-10 NOTE — Telephone Encounter (Signed)
Routing this message to Dr. Loanne Drilling. Formatted letter has been placed on Dr. Cordelia Pen desk for him to review and to generate.

## 2019-10-10 NOTE — Telephone Encounter (Signed)
OK, but I need to know what counselor you last saw, and how long ago that was.

## 2019-10-13 ENCOUNTER — Encounter: Payer: Self-pay | Admitting: Endocrinology

## 2019-10-16 NOTE — Telephone Encounter (Signed)
[facility] [place] [address], [address] [place], [place]  [address] Phone:  [phone]   Fax:  [phone]  October 13, 2019   Patient: Anthony Rowe  Date of Birth: [birth year]  Date of Visit: 10/13/2019    To Whom It May Concern:  Lewellyn Colegrove has been under my care for transgender (F to M) hormonal therapy since 2019.  He previously saw another endocrinologist in [place], [place].  He most recently saw a psychologist in 2014.  I am a board certified endocrinologist licensed to practice in [place].    Sincerely,    Renato Shin, MD   [facility]                            [facility] [place], [id number]                   1

## 2019-10-21 ENCOUNTER — Other Ambulatory Visit: Payer: Self-pay | Admitting: Endocrinology

## 2019-10-23 NOTE — Telephone Encounter (Signed)
Please advise 

## 2019-10-23 NOTE — Telephone Encounter (Signed)
Please refill x 1 Further refills would have to be considered by PCP, as pt no longer needs this for the thyroid

## 2019-10-31 ENCOUNTER — Telehealth: Payer: Self-pay

## 2019-10-31 NOTE — Telephone Encounter (Signed)
Called [facility] to find out what they were still in need of and they stated that they needed the most recent labs and chart notes. This was faxed to them.

## 2019-10-31 NOTE — Telephone Encounter (Signed)
Patient called and stated that Dr license number is needed for the letter to be able to be used.  Patient also wanted to know when and why his medical clearance has not been done for his dentist office he needs the tooth out soon as he has started to get another abscess    Please call and advise

## 2019-10-31 NOTE — Telephone Encounter (Signed)
He was cleared on 09/26/19.  What is needed now: a letter, or a form?

## 2019-11-06 ENCOUNTER — Other Ambulatory Visit: Payer: Self-pay

## 2019-11-06 ENCOUNTER — Ambulatory Visit (INDEPENDENT_AMBULATORY_CARE_PROVIDER_SITE_OTHER): Payer: Medicare Other | Admitting: Endocrinology

## 2019-11-06 ENCOUNTER — Encounter: Payer: Self-pay | Admitting: Endocrinology

## 2019-11-06 ENCOUNTER — Other Ambulatory Visit: Payer: Self-pay | Admitting: Endocrinology

## 2019-11-06 VITALS — BP 136/82 | HR 77 | Ht 62.5 in | Wt 186.0 lb

## 2019-11-06 DIAGNOSIS — F640 Transsexualism: Secondary | ICD-10-CM

## 2019-11-06 DIAGNOSIS — E890 Postprocedural hypothyroidism: Secondary | ICD-10-CM | POA: Diagnosis not present

## 2019-11-06 DIAGNOSIS — E0590 Thyrotoxicosis, unspecified without thyrotoxic crisis or storm: Secondary | ICD-10-CM

## 2019-11-06 DIAGNOSIS — E039 Hypothyroidism, unspecified: Secondary | ICD-10-CM | POA: Insufficient documentation

## 2019-11-06 DIAGNOSIS — Z789 Other specified health status: Secondary | ICD-10-CM

## 2019-11-06 NOTE — Patient Instructions (Addendum)
Blood tests are requested for you today.  We'll let you know about the results.  Please come back for a follow-up appointment in 1 month.  

## 2019-11-06 NOTE — Progress Notes (Signed)
Subjective:    Patient ID: Anthony Rowe, adult    DOB: January 23, 1981, 39 y.o.   MRN: [id number]  HPI Pt is under medical rx for transgender state (F to M; he had breast reduction surgery in 2017; he last went to counseling in 2014; he has been on testosterone since 2015; dosage is limited by polycythemia).  He does not take elagolix. Last testosterone dose was 2 weeks ago.   Pt also has hyperthyroidism (dx'ed 2019; tapazole was chosen as initial rx, due to severity; in 06/2019, pt had RAI rx; in 11/20, he started synthroid for hypothyroidism).   pt states he feels well in general.   Past Medical History:  Diagnosis Date  . Asthma   . Bronchitis   . Endometriosis   . Hypertension   . Migraine     Past Surgical History:  Procedure Laterality Date  . ANKLE SURGERY    . MASTECTOMY Bilateral 2017   male to male     Social History   Socioeconomic History  . Marital status: Married    Spouse name: Not on file  . Number of children: Not on file  . Years of education: Not on file  . Highest education level: Not on file  Occupational History  . Not on file  Tobacco Use  . Smoking status: Never Smoker  . Smokeless tobacco: Never Used  Substance and Sexual Activity  . Alcohol use: No  . Drug use: No  . Sexual activity: Not Currently  Other Topics Concern  . Not on file  Social History Narrative  . Not on file   Social Determinants of Health   Financial Resource Strain:   . Difficulty of Paying Living Expenses: Not on file  Food Insecurity:   . Worried About Programme researcher, broadcasting/film/video in the Last Year: Not on file  . Ran Out of Food in the Last Year: Not on file  Transportation Needs:   . Lack of Transportation (Medical): Not on file  . Lack of Transportation (Non-Medical): Not on file  Physical Activity:   . Days of Exercise per Week: Not on file  . Minutes of Exercise per Session: Not on file  Stress:   . Feeling of Stress : Not on file  Social Connections:   . Frequency  of Communication with Friends and Family: Not on file  . Frequency of Social Gatherings with Friends and Family: Not on file  . Attends Religious Services: Not on file  . Active Member of Clubs or Organizations: Not on file  . Attends Banker Meetings: Not on file  . Marital Status: Not on file  Intimate Partner Violence:   . Fear of Current or Ex-Partner: Not on file  . Emotionally Abused: Not on file  . Physically Abused: Not on file  . Sexually Abused: Not on file    Current Outpatient Medications on File Prior to Visit  Medication Sig Dispense Refill  . albuterol (PROVENTIL HFA;VENTOLIN HFA) 108 (90 Base) MCG/ACT inhaler 2 Puff every 4-6 hours as needed 1 Inhaler 1  . cyclobenzaprine (FLEXERIL) 10 MG tablet Take by mouth.    . Elagolix Sodium 150 MG TABS Take 150 mg by mouth daily. 30 tablet 5  . famotidine (PEPCID) 20 MG tablet Take 1 tablet (20 mg total) by mouth 2 (two) times daily. 180 tablet 1  . famotidine (PEPCID) 20 MG tablet Take 1 tablet (20 mg total) by mouth 2 (two) times daily. 60 tablet 5  .  FLOVENT HFA 220 MCG/ACT inhaler TAKE 2 PUFFS BY MOUTH TWICE A DAY  5  . fluticasone (FLONASE) 50 MCG/ACT nasal spray Place 1 spray into both nostrils daily. 16 g 0  . hydrochlorothiazide (HYDRODIURIL) 25 MG tablet hydrochlorothiazide 25 mg tablet  Take 1 tablet every day by oral route.    . hydrOXYzine (ATARAX/VISTARIL) 25 MG tablet Take 1-2 tablets (25-50 mg total) by mouth at bedtime as needed. 60 tablet 5  . ibuprofen (ADVIL,MOTRIN) 800 MG tablet Take 800-1,600 mg by mouth every 8 (eight) hours as needed for headache, mild pain or moderate pain (migraine).     Marland Kitchen levocetirizine (XYZAL) 5 MG tablet Take 1 tablet (5 mg total) by mouth 2 (two) times daily. 60 tablet 5  . lisinopril (PRINIVIL,ZESTRIL) 20 MG tablet Take 1 tablet (20 mg total) by mouth daily. 30 tablet 1  . loperamide (IMODIUM) 2 MG capsule Take 1 capsule (2 mg total) by mouth daily as needed for diarrhea or  loose stools. 10 capsule 0  . montelukast (SINGULAIR) 10 MG tablet Take 1 tablet (10 mg total) by mouth at bedtime. 30 tablet 5  . ondansetron (ZOFRAN-ODT) 8 MG disintegrating tablet Take 1 tablet (8 mg total) by mouth every 8 (eight) hours as needed for nausea or vomiting. 30 tablet 0  . testosterone cypionate (DEPOTESTOTERONE CYPIONATE) 100 MG/ML injection INJECT 0.5ML IN THE MUSCLE EVERY 10 DAYS 10 mL 0  . traZODone (DESYREL) 100 MG tablet Take 100 mg by mouth at bedtime as needed for sleep.     Marland Kitchen triamcinolone cream (KENALOG) 0.1 % Apply 1 application topically 4 (four) times daily. As needed for itching 45 g 2   No current facility-administered medications on file prior to visit.    Allergies  Allergen Reactions  . Frovatriptan Other (See Comments)    Numbness in both legs  . Sulfa Antibiotics Other (See Comments)    Makes patients legs numb     Family History  Problem Relation Age of Onset  . Cancer Mother        lung  . Hypertension Maternal Grandmother   . Diabetes Maternal Grandmother   . Thyroid disease Neg Hx     BP 136/82 (BP Location: Left Arm, Patient Position: Sitting, Cuff Size: Normal)   Pulse 77   Ht 5' 2.5" (1.588 m)   Wt 186 lb (84.4 kg)   SpO2 99%   BMI 33.48 kg/m    Review of Systems     Objective:   Physical Exam VITAL SIGNS:  See vs page GENERAL: no distress Ext: no leg swelling.      Assessment & Plan:  Post-RAI hypothyroidism: worse.  I have sent a prescription to your pharmacy, to increase synthroid.  Transgender state: check labs today.  Polycythemia: This limits testosterone dosage.    Patient Instructions  Blood tests are requested for you today.  We'll let you know about the results.  Please come back for a follow-up appointment in 1 month.

## 2019-11-07 LAB — CBC WITH DIFFERENTIAL/PLATELET
Basophils Absolute: 0.1 10*3/uL (ref 0.0–0.1)
Basophils Relative: 1.7 % (ref 0.0–3.0)
Eosinophils Absolute: 0.1 10*3/uL (ref 0.0–0.7)
Eosinophils Relative: 2.5 % (ref 0.0–5.0)
HCT: 50.2 % (ref 39.0–52.0)
Hemoglobin: 17.1 g/dL — ABNORMAL HIGH (ref 13.0–17.0)
Lymphocytes Relative: 25.8 % (ref 12.0–46.0)
Lymphs Abs: 1.4 10*3/uL (ref 0.7–4.0)
MCHC: 34 g/dL (ref 30.0–36.0)
MCV: 93.1 fl (ref 78.0–100.0)
Monocytes Absolute: 0.4 10*3/uL (ref 0.1–1.0)
Monocytes Relative: 6.8 % (ref 3.0–12.0)
Neutro Abs: 3.4 10*3/uL (ref 1.4–7.7)
Neutrophils Relative %: 63.2 % (ref 43.0–77.0)
Platelets: 330 10*3/uL (ref 150.0–400.0)
RBC: 5.4 Mil/uL (ref 4.22–5.81)
RDW: 16.4 % — ABNORMAL HIGH (ref 11.5–15.5)
WBC: 5.4 10*3/uL (ref 4.0–10.5)

## 2019-11-07 LAB — TSH: TSH: 47.87 u[IU]/mL — ABNORMAL HIGH (ref 0.35–4.50)

## 2019-11-07 LAB — TESTOSTERONE,FREE AND TOTAL
Testosterone, Free: 2.9 pg/mL — ABNORMAL LOW (ref 8.7–25.1)
Testosterone: 57 ng/dL — ABNORMAL LOW (ref 264–916)

## 2019-11-07 LAB — T4, FREE: Free T4: 0.4 ng/dL — ABNORMAL LOW (ref 0.60–1.60)

## 2019-11-07 MED ORDER — LEVOTHYROXINE SODIUM 150 MCG PO TABS: 150.0000 ug | ORAL_TABLET | Freq: Every day | ORAL | 3 refills | Status: DC

## 2019-11-08 ENCOUNTER — Telehealth: Payer: Self-pay

## 2019-11-08 NOTE — Telephone Encounter (Signed)
Lab results reviewed by Dr. Ellison. Called pt to inform about lab results as well as new orders. LVM requesting returned call. 

## 2019-11-08 NOTE — Telephone Encounter (Signed)
SECOND ATTEMPT:  Called pt and informed of Dr. George Hugh new orders. Rescheduled appt from February to July per Dr. George Hugh request. Using closed-loop communication, pt verbalized complete acceptance and understanding of all information provided. No further questions nor concerns were voiced at this time.

## 2019-11-08 NOTE — Telephone Encounter (Signed)
-----   Message from Romero Belling, MD sent at 11/07/2019  7:01 PM EST ----- please contact patient: We need to increase the thyroid pill.  I have sent a prescription to your pharmacy.  The red blood cells are still high.  Please reduce the testosterone to 0.4 cc every 10 days. Please come back for a follow-up appointment in 6 months

## 2019-11-10 LAB — ESTRADIOL, FREE
Estradiol, Free: 0.53 pg/mL — ABNORMAL HIGH
Estradiol: 23 pg/mL

## 2019-11-13 ENCOUNTER — Telehealth: Payer: Self-pay

## 2019-11-13 NOTE — Telephone Encounter (Signed)
-----   Message from Romero Belling, MD sent at 11/12/2019  4:56 PM EST ----- please contact patient: Clarification: Please come back for a follow-up appointment in 1 month, rather than 6

## 2019-11-13 NOTE — Telephone Encounter (Signed)
Patient has been scheduled for appointment on 12/08/19 at 9:00 a.m.

## 2019-11-13 NOTE — Telephone Encounter (Signed)
Per Dr. George Hugh request, routing this message to the front desk for rescheduling purposes

## 2019-11-28 ENCOUNTER — Encounter: Payer: Self-pay | Admitting: Physical Therapy

## 2019-11-28 ENCOUNTER — Other Ambulatory Visit: Payer: Self-pay

## 2019-11-28 ENCOUNTER — Ambulatory Visit: Payer: Medicare Other | Attending: Orthopedic Surgery | Admitting: Physical Therapy

## 2019-11-28 DIAGNOSIS — R293 Abnormal posture: Secondary | ICD-10-CM | POA: Diagnosis present

## 2019-11-28 DIAGNOSIS — M62830 Muscle spasm of back: Secondary | ICD-10-CM | POA: Diagnosis present

## 2019-11-28 DIAGNOSIS — M545 Low back pain, unspecified: Secondary | ICD-10-CM

## 2019-11-28 DIAGNOSIS — G8929 Other chronic pain: Secondary | ICD-10-CM | POA: Diagnosis present
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
## 2019-12-07 ENCOUNTER — Ambulatory Visit: Payer: Medicare Other | Admitting: Physical Therapy

## 2019-12-08 ENCOUNTER — Ambulatory Visit: Payer: Medicare Other | Admitting: Endocrinology

## 2019-12-12 ENCOUNTER — Ambulatory Visit: Payer: Medicare Other | Admitting: Physical Therapy

## 2019-12-14 ENCOUNTER — Encounter: Payer: Self-pay | Admitting: Physical Therapy

## 2019-12-14 ENCOUNTER — Other Ambulatory Visit: Payer: Self-pay

## 2019-12-14 ENCOUNTER — Ambulatory Visit: Payer: Medicare Other | Attending: Orthopedic Surgery | Admitting: Physical Therapy

## 2019-12-14 DIAGNOSIS — M545 Low back pain: Secondary | ICD-10-CM | POA: Insufficient documentation

## 2019-12-14 DIAGNOSIS — G8929 Other chronic pain: Secondary | ICD-10-CM | POA: Diagnosis present

## 2019-12-14 DIAGNOSIS — R293 Abnormal posture: Secondary | ICD-10-CM | POA: Insufficient documentation

## 2019-12-14 DIAGNOSIS — M62830 Muscle spasm of back: Secondary | ICD-10-CM | POA: Diagnosis present

## 2019-12-14 NOTE — Therapy (Signed)
[facility] [address] [place], [place], [address] Phone: [phone]   Fax:  [phone]  Physical Therapy Treatment  Patient Details  Name: Anthony Rowe MRN: [id number] Date of Birth: [birth year] Referring Provider (PT): Teryl Lucy MD   Encounter Date: 12/14/2019  PT End of Session - 12/14/19 1103    Visit Number  2    Number of Visits  13    Date for PT Re-Evaluation  01/09/20    Authorization Type  MCR: Kx mod at 15th visit progress note at 10th visit    PT Start Time  1102    PT Stop Time  1142    PT Time Calculation (min)  40 min    Activity Tolerance  Patient limited by pain    Behavior During Therapy  [facility] for tasks assessed/performed       Past Medical History:  Diagnosis Date  . Asthma   . Bronchitis   . Endometriosis   . Hypertension   . Migraine   . Thyroid disease    hyperthyroidism per pt report    Past Surgical History:  Procedure Laterality Date  . ANKLE SURGERY    . MASTECTOMY Bilateral 2017   male to male     There were no vitals filed for this visit.  Subjective Assessment - 12/14/19 1104    Subjective  "I've been doing okay, I haven't been able to do much because I have so much going on"    Diagnostic tests  x-ray and MRI at MD's office    Currently in Pain?  Yes    Pain Score  5     Pain Location  Back    Pain Orientation  Right;Left;Lower    Pain Descriptors / Indicators  Aching    Pain Type  Chronic pain    Pain Onset  More than a month ago    Pain Frequency  Intermittent    Aggravating Factors   bending forward         [facility] PT Assessment - 12/14/19 0001      Assessment   Medical Diagnosis  low back pain    Referring Provider (PT)  Teryl Lucy MD                   [facility] Adult PT Treatment/Exercise - 12/14/19 0001      Lumbar Exercises: Stretches   Lower Trunk Rotation Limitations  1 x 15    Prone on Elbows Stretch Limitations  2 x 10    Quadruped  Mid Back Stretch  2 reps;30 seconds   childs pose     Lumbar Exercises: Supine   Ab Set  10 reps;5 seconds   cues for ADIM   Pelvic Tilt  1 rep;10 reps;5 seconds   PPT    Bent Knee Raise  10 reps   with sustained ADIM     Manual Therapy   Manual Therapy  Soft tissue mobilization    Manual therapy comments  skilled palpation and monitoring of pt throughout TPDN    Soft tissue mobilization  IASTM lumbar paraspinals       Trigger Point Dry Needling - 12/14/19 0001    Consent Given?  Yes    Education Handout Provided  Yes    Muscles Treated Back/Hip  Erector spinae    Erector spinae Response  Twitch response elicited;Palpable increased muscle length   bil lumbar paraspinals          PT Education -  12/14/19 1108    Education Details  muscle anatomy and referral patterns. What TPDN is benefits and what expect    Person(s) Educated  Patient    Methods  Explanation;Verbal cues    Comprehension  Verbalized understanding;Verbal cues required       PT Short Term Goals - 11/28/19 0941      PT SHORT TERM GOAL #1   Title  pt to be I with inital HEP    Time  3    Period  Weeks    Status  New    [place] Date  12/19/19      PT SHORT TERM GOAL #2   Title  pt to verbalize/ demo proper posture and lifting mechanics to reduce and prevent back pain    Time  3    Period  Weeks    Status  New    [place] Date  12/19/19      PT SHORT TERM GOAL #3   Title  pt to reduce thoracolumbar paraspinal spasm to reduce pain to </= 6/10 max pain for functional progression    Time  3    Period  Weeks    Status  New    [place] Date  12/19/19        PT Long Term Goals - 11/28/19 0943      PT LONG TERM GOAL #1   Title  pt to increase gross trunk flexion by >/= 20 degrees and extension by >/= 8 degrees for functional ROM required for ADLs and work activities    Time  6    Period  Weeks    Status  New    [place] Date  01/09/20      PT LONG TERM GOAL #2   Title  pt to be able to sit/ stand/  walk for >/= 60 min with ,/= 3/10 max pain for functional endurance for work related tasks    Time  6    Period  Weeks    Status  New    [place] Date  01/09/20      PT LONG TERM GOAL #3   Title  increase FOTO score to </= 49% limited to demo improvement in function    Time  6    Period  Weeks    Status  New      PT LONG TERM GOAL #4   Title  pt to be able to perform usual work and house hold activities with little difficulty for QOL    Time  6    Period  Weeks    Status  New    [place] Date  01/09/20      PT LONG TERM GOAL #5   Title  pt to be I with all HEP given as of last visit to maintain and progress current level of function    Time  6    Period  Weeks    Status  New    [place] Date  01/09/20            Plan - 12/14/19 1149    Clinical Impression Statement  pt reports minimal consistency with his HEP but notes it is secondary to having alot going on at home. educated and consent was provided for TPDN focusing on bil lumbar paraspinals followed with IASTM techniques. working on core strengthening and low back stretching. end of session he reported decreased pain 2-3/10 pain.    PT Treatment/Interventions  ADLs/Self Care Home Management;Electrical Stimulation;Iontophoresis 4mg /ml Dexamethasone;Moist  Heat;Traction;Ultrasound;Gait training;Stair training;Therapeutic activities;Therapeutic exercise;Functional mobility training;Balance training;Neuromuscular re-education;Patient/family education;Manual techniques;Passive range of motion;Dry needling;Taping    PT Next Visit Plan  review/ update HEP PRN, STW along thoracolumbar paraspinals, core strengthening, posture education, modalities PRN, response to TPDN    PT Home Exercise Plan  [id number] - lower trunk rotation, supine posterior pelvic tilt, low back stretch, supine<>sit log rolling    Consulted and Agree with Plan of Care  Patient       Patient will benefit from skilled therapeutic intervention in order to improve the  following deficits and impairments:  Pain, Improper body mechanics, Increased muscle spasms, Abnormal gait, Postural dysfunction, Decreased balance  Visit Diagnosis: Chronic bilateral low back pain, unspecified whether sciatica present  Abnormal posture  Muscle spasm of back     Problem List Patient Active Problem List   Diagnosis Date Noted  . Hypothyroidism 11/06/2019  . Hypokalemia 05/29/2019  . Migraine 08/12/2018  . HTN (hypertension) 08/12/2018  . Asthma 08/12/2018  . Male-to-male transgender person 08/12/2018  . Gender identity disorder 08/19/2016  . Left ankle pain 09/25/2013  . Hypovitaminosis D 09/25/2013    Lulu Riding PT, DPT, LAT, ATC  12/14/19  11:52 AM      [facility] [address] [place], [place], [address] Phone: [phone]   Fax:  [phone]  Name: Anthony Rowe MRN: [id number] Date of Birth: 1981/03/28

## 2019-12-18 ENCOUNTER — Other Ambulatory Visit: Payer: Self-pay

## 2019-12-19 ENCOUNTER — Ambulatory Visit: Payer: Medicare Other | Admitting: Physical Therapy

## 2019-12-20 ENCOUNTER — Encounter: Payer: Self-pay | Admitting: Endocrinology

## 2019-12-20 ENCOUNTER — Ambulatory Visit (INDEPENDENT_AMBULATORY_CARE_PROVIDER_SITE_OTHER): Payer: Medicare Other | Admitting: Endocrinology

## 2019-12-20 ENCOUNTER — Other Ambulatory Visit: Payer: Self-pay

## 2019-12-20 VITALS — BP 110/70 | HR 90 | Ht 62.5 in | Wt 185.6 lb

## 2019-12-20 DIAGNOSIS — D751 Secondary polycythemia: Secondary | ICD-10-CM | POA: Diagnosis not present

## 2019-12-20 DIAGNOSIS — F640 Transsexualism: Secondary | ICD-10-CM | POA: Diagnosis not present

## 2019-12-20 DIAGNOSIS — E890 Postprocedural hypothyroidism: Secondary | ICD-10-CM | POA: Diagnosis not present

## 2019-12-20 DIAGNOSIS — Z789 Other specified health status: Secondary | ICD-10-CM

## 2019-12-20 LAB — CBC WITH DIFFERENTIAL/PLATELET
Basophils Absolute: 0.1 10*3/uL (ref 0.0–0.1)
Basophils Relative: 2.3 % (ref 0.0–3.0)
Eosinophils Absolute: 0.1 10*3/uL (ref 0.0–0.7)
Eosinophils Relative: 2.8 % (ref 0.0–5.0)
HCT: 44.9 % (ref 39.0–52.0)
Hemoglobin: 15.4 g/dL (ref 13.0–17.0)
Lymphocytes Relative: 32.6 % (ref 12.0–46.0)
Lymphs Abs: 1.6 10*3/uL (ref 0.7–4.0)
MCHC: 34.3 g/dL (ref 30.0–36.0)
MCV: 93.1 fl (ref 78.0–100.0)
Monocytes Absolute: 0.2 10*3/uL (ref 0.1–1.0)
Monocytes Relative: 3.9 % (ref 3.0–12.0)
Neutro Abs: 2.9 10*3/uL (ref 1.4–7.7)
Neutrophils Relative %: 58.4 % (ref 43.0–77.0)
Platelets: 330 10*3/uL (ref 150.0–400.0)
RBC: 4.83 Mil/uL (ref 4.22–5.81)
RDW: 14.2 % (ref 11.5–15.5)
WBC: 4.9 10*3/uL (ref 4.0–10.5)

## 2019-12-20 LAB — TSH: TSH: 47.32 u[IU]/mL — ABNORMAL HIGH (ref 0.35–4.50)

## 2019-12-20 LAB — T4, FREE: Free T4: 0.12 ng/dL — ABNORMAL LOW (ref 0.60–1.60)

## 2019-12-20 MED ORDER — LEVOTHYROXINE SODIUM 175 MCG PO TABS: 175.0000 ug | ORAL_TABLET | Freq: Every day | ORAL | 1 refills | Status: DC

## 2019-12-20 NOTE — Progress Notes (Signed)
Subjective:    Patient ID: Anthony Rowe, adult    DOB: January 25, 1981, 39 y.o.   MRN: [id number]  HPI Pt returns for f/u of transgender state (F to M): Surgery: breast reduction (2017) Medication: testosterone since 2015 Counseling: he last went in 2014 Other: ins declined elagolix; testosterone dosage is limited by polycythemia Interval hx: he has run out of testosterone Pt also has hyperthyroidism (dx'ed 2019; tapazole was chosen as initial rx, due to severity; in 06/2019, pt had RAI rx; in 11/20, he started synthroid for hypothyroidism).   pt states he feels well in general.  He takes synthroid as rx'ed.   Past Medical History:  Diagnosis Date  . Asthma   . Bronchitis   . Endometriosis   . Hypertension   . Migraine   . Thyroid disease    hyperthyroidism per pt report    Past Surgical History:  Procedure Laterality Date  . ANKLE SURGERY    . MASTECTOMY Bilateral 2017   male to male     Social History   Socioeconomic History  . Marital status: Married    Spouse name: Not on file  . Number of children: Not on file  . Years of education: Not on file  . Highest education level: Not on file  Occupational History  . Not on file  Tobacco Use  . Smoking status: Never Smoker  . Smokeless tobacco: Never Used  Substance and Sexual Activity  . Alcohol use: No  . Drug use: No  . Sexual activity: Not Currently  Other Topics Concern  . Not on file  Social History Narrative  . Not on file   Social Determinants of Health   Financial Resource Strain:   . Difficulty of Paying Living Expenses: Not on file  Food Insecurity:   . Worried About Programme researcher, broadcasting/film/video in the Last Year: Not on file  . Ran Out of Food in the Last Year: Not on file  Transportation Needs:   . Lack of Transportation (Medical): Not on file  . Lack of Transportation (Non-Medical): Not on file  Physical Activity:   . Days of Exercise per Week: Not on file  . Minutes of Exercise per Session: Not on  file  Stress:   . Feeling of Stress : Not on file  Social Connections:   . Frequency of Communication with Friends and Family: Not on file  . Frequency of Social Gatherings with Friends and Family: Not on file  . Attends Religious Services: Not on file  . Active Member of Clubs or Organizations: Not on file  . Attends Banker Meetings: Not on file  . Marital Status: Not on file  Intimate Partner Violence:   . Fear of Current or Ex-Partner: Not on file  . Emotionally Abused: Not on file  . Physically Abused: Not on file  . Sexually Abused: Not on file    Current Outpatient Medications on File Prior to Visit  Medication Sig Dispense Refill  . albuterol (PROVENTIL HFA;VENTOLIN HFA) 108 (90 Base) MCG/ACT inhaler 2 Puff every 4-6 hours as needed 1 Inhaler 1  . cyclobenzaprine (FLEXERIL) 10 MG tablet Take by mouth.    . Elagolix Sodium 150 MG TABS Take 150 mg by mouth daily. 30 tablet 5  . famotidine (PEPCID) 20 MG tablet Take 1 tablet (20 mg total) by mouth 2 (two) times daily. 60 tablet 5  . FLOVENT HFA 220 MCG/ACT inhaler TAKE 2 PUFFS BY MOUTH TWICE A DAY  5  .  fluticasone (FLONASE) 50 MCG/ACT nasal spray Place 1 spray into both nostrils daily. 16 g 0  . hydrochlorothiazide (HYDRODIURIL) 25 MG tablet hydrochlorothiazide 25 mg tablet  Take 1 tablet every day by oral route.    . hydrOXYzine (ATARAX/VISTARIL) 25 MG tablet Take 1-2 tablets (25-50 mg total) by mouth at bedtime as needed. 60 tablet 5  . ibuprofen (ADVIL,MOTRIN) 800 MG tablet Take 800-1,600 mg by mouth every 8 (eight) hours as needed for headache, mild pain or moderate pain (migraine).     Marland Kitchen levocetirizine (XYZAL) 5 MG tablet Take 1 tablet (5 mg total) by mouth 2 (two) times daily. 60 tablet 5  . lisinopril (PRINIVIL,ZESTRIL) 20 MG tablet Take 1 tablet (20 mg total) by mouth daily. 30 tablet 1  . loperamide (IMODIUM) 2 MG capsule Take 1 capsule (2 mg total) by mouth daily as needed for diarrhea or loose stools. 10  capsule 0  . metoprolol succinate (TOPROL-XL) 100 MG 24 hr tablet TAKE 1 TABLET BY MOUTH DAILY. SEND FUTURE REFILLS TO PCP 90 tablet 1  . montelukast (SINGULAIR) 10 MG tablet Take 1 tablet (10 mg total) by mouth at bedtime. 30 tablet 5  . ondansetron (ZOFRAN-ODT) 8 MG disintegrating tablet Take 1 tablet (8 mg total) by mouth every 8 (eight) hours as needed for nausea or vomiting. 30 tablet 0  . testosterone cypionate (DEPOTESTOTERONE CYPIONATE) 100 MG/ML injection INJECT 0.5ML IN THE MUSCLE EVERY 10 DAYS 10 mL 0  . traZODone (DESYREL) 100 MG tablet Take 100 mg by mouth at bedtime as needed for sleep.     Marland Kitchen triamcinolone cream (KENALOG) 0.1 % Apply 1 application topically 4 (four) times daily. As needed for itching 45 g 2   No current facility-administered medications on file prior to visit.    Allergies  Allergen Reactions  . Frovatriptan Other (See Comments)    Numbness in both legs  . Sulfa Antibiotics Other (See Comments)    Makes patients legs numb     Family History  Problem Relation Age of Onset  . Cancer Mother        lung  . Hypertension Maternal Grandmother   . Diabetes Maternal Grandmother   . Thyroid disease Neg Hx     BP 110/70 (BP Location: Left Arm, Patient Position: Sitting, Cuff Size: Large)   Pulse 90   Ht 5' 2.5" (1.588 m)   Wt 185 lb 9.6 oz (84.2 kg)   SpO2 99%   BMI 33.41 kg/m   Review of Systems He denies sob.     Objective:   Physical Exam VITAL SIGNS:  See vs page.  GENERAL: no distress.  Ext: no leg edema.    Lab Results  Component Value Date   TSH 47.32 (H) 12/20/2019      Assessment & Plan:  Post-RAI hypothyroidism: persistent.  Increase synthroid Polycythemia: recheck today. Transgender state (F to M)  Patient Instructions  Blood tests are requested for you today.  We'll let you know about the results.  Please see a Urology specialist.  you will receive a phone call, about a day and time for an appointment Please come back for a  follow-up appointment in 2 months.

## 2019-12-20 NOTE — Patient Instructions (Addendum)
Blood tests are requested for you today.  We'll let you know about the results.  Please see a Urology specialist.  you will receive a phone call, about a day and time for an appointment Please come back for a follow-up appointment in 2 months.

## 2019-12-21 ENCOUNTER — Ambulatory Visit: Payer: Medicare Other | Admitting: Physical Therapy

## 2019-12-21 ENCOUNTER — Telehealth: Payer: Self-pay

## 2019-12-21 NOTE — Telephone Encounter (Signed)
-----   Message from Romero Belling, MD sent at 12/20/2019  6:52 PM EST ----- please contact patient: We need to increase the levothyroxine. I have sent a prescription to your pharmacy.  Due to this, Please come back for a follow-up appointment in 1 month.

## 2019-12-21 NOTE — Telephone Encounter (Signed)
Lab results reviewed by Dr. Everardo All. Called pt to inform about lab results as well as new orders. Also wanted to schedule pt for f/u per Dr. George Hugh request. Unable to LVM requesting returned call d/t VM being full. Will continue efforts to reach pt.

## 2019-12-21 NOTE — Telephone Encounter (Signed)
SECOND ATTEMPT:  Called pt to inform about lab results as well as new orders. Again, unable to LVM requesting returned call d/t VM being full.

## 2019-12-22 ENCOUNTER — Other Ambulatory Visit: Payer: Self-pay | Admitting: Endocrinology

## 2019-12-22 LAB — TESTOSTERONE,FREE AND TOTAL
Testosterone, Free: 3.7 pg/mL — ABNORMAL LOW (ref 8.7–25.1)
Testosterone: 60 ng/dL — ABNORMAL LOW (ref 264–916)

## 2019-12-22 MED ORDER — TESTOSTERONE CYPIONATE 100 MG/ML IM SOLN: INTRAMUSCULAR | 0 refills | Status: DC

## 2019-12-22 NOTE — Telephone Encounter (Signed)
FINAL ATTEMPT:  Called pt to inform about lab results, new orders and to schedule appt. Call could not be completed at this time. Therefore, letter has now been mailed. For future reference, letter can be found in Epic.

## 2019-12-26 ENCOUNTER — Ambulatory Visit: Payer: Medicare Other | Admitting: Physical Therapy

## 2019-12-27 ENCOUNTER — Ambulatory Visit: Payer: Medicare Other | Admitting: Allergy

## 2019-12-28 ENCOUNTER — Other Ambulatory Visit: Payer: Self-pay

## 2019-12-28 ENCOUNTER — Encounter: Payer: Self-pay | Admitting: Physical Therapy

## 2019-12-28 ENCOUNTER — Ambulatory Visit: Payer: Medicare Other | Admitting: Physical Therapy

## 2019-12-28 DIAGNOSIS — M545 Low back pain, unspecified: Secondary | ICD-10-CM

## 2019-12-28 DIAGNOSIS — R293 Abnormal posture: Secondary | ICD-10-CM

## 2019-12-28 DIAGNOSIS — G8929 Other chronic pain: Secondary | ICD-10-CM

## 2019-12-28 DIAGNOSIS — M62830 Muscle spasm of back: Secondary | ICD-10-CM

## 2019-12-28 NOTE — Therapy (Signed)
[facility] [address] [place], [place], [address] Phone: [phone]   Fax:  [phone]  Physical Therapy Treatment  Patient Details  Name: Anthony Rowe MRN: [id number] Date of Birth: 11/08/1980 Referring Provider (PT): Teryl Lucy MD   Encounter Date: 12/28/2019  PT End of Session - 12/28/19 1154    Visit Number  3    Number of Visits  13    Date for PT Re-Evaluation  01/09/20    Authorization Type  MCR: Kx mod at 15th visit progress note at 10th visit    PT Start Time  1153    PT Stop Time  1227    PT Time Calculation (min)  34 min    Activity Tolerance  Patient tolerated treatment well    Behavior During Therapy  [facility] for tasks assessed/performed       Past Medical History:  Diagnosis Date  . Asthma   . Bronchitis   . Endometriosis   . Hypertension   . Migraine   . Thyroid disease    hyperthyroidism per pt report    Past Surgical History:  Procedure Laterality Date  . ANKLE SURGERY    . MASTECTOMY Bilateral 2017   male to male     There were no vitals filed for this visit.  Subjective Assessment - 12/28/19 1155    Subjective  " Yesterday my legs went numb with walking and it lasted for about an hour. The back pain has been okay, I did get a second injection last Friday which has helped."    Patient Stated Goals  strengthen the back, do more activity with decreased pain,    Currently in Pain?  Yes    Pain Score  6     Pain Location  Back    Pain Orientation  Right;Left    Pain Descriptors / Indicators  Aching    Pain Type  Chronic pain    Pain Onset  More than a month ago    Pain Frequency  Intermittent    Aggravating Factors   bending forward, walking forward.         [facility] PT Assessment - 12/28/19 0001      Assessment   Medical Diagnosis  low back pain    Referring Provider (PT)  Teryl Lucy MD                   [facility] Adult PT Treatment/Exercise - 12/28/19 0001      Self-Care   Self-Care  Other Self-Care Comments    Other Self-Care Comments   manual trigger point release techniques using tennis ball against the wall      Lumbar Exercises: Stretches   Other Lumbar Stretch Exercise  seated low back stretch      Lumbar Exercises: Aerobic   Nustep  L5  x 6 min UE/LE      Lumbar Exercises: Standing   Other Standing Lumbar Exercises  standing hip abduction/ extension 2 x 10 with red theraband    Other Standing Lumbar Exercises  marching in place 1 x 20       Knee/Hip Exercises: Standing   Gait Training  walking focusing on heel strike/ toe off avoiding excessive lateral sway and cues for reciprocal arm swing 2 x 80 ft             PT Education - 12/28/19 1244    Education Details  updated HEP for standing hip strengthening. efficient gait biomechanics.  Person(s) Educated  Patient    Methods  Explanation;Verbal cues;Handout    Comprehension  Verbalized understanding;Verbal cues required       PT Short Term Goals - 11/28/19 0941      PT SHORT TERM GOAL #1   Title  pt to be I with inital HEP    Time  3    Period  Weeks    Status  New    Target Date  12/19/19      PT SHORT TERM GOAL #2   Title  pt to verbalize/ demo proper posture and lifting mechanics to reduce and prevent back pain    Time  3    Period  Weeks    Status  New    Target Date  12/19/19      PT SHORT TERM GOAL #3   Title  pt to reduce thoracolumbar paraspinal spasm to reduce pain to </= 6/10 max pain for functional progression    Time  3    Period  Weeks    Status  New    Target Date  12/19/19        PT Long Term Goals - 11/28/19 0943      PT LONG TERM GOAL #1   Title  pt to increase gross trunk flexion by >/= 20 degrees and extension by >/= 8 degrees for functional ROM required for ADLs and work activities    Time  6    Period  Weeks    Status  New    Target Date  01/09/20      PT LONG TERM GOAL #2   Title  pt to be able to sit/ stand/ walk for >/= 60  min with ,/= 3/10 max pain for functional endurance for work related tasks    Time  6    Period  Weeks    Status  New    Target Date  01/09/20      PT LONG TERM GOAL #3   Title  increase FOTO score to </= 49% limited to demo improvement in function    Time  6    Period  Weeks    Status  New      PT LONG TERM GOAL #4   Title  pt to be able to perform usual work and house hold activities with little difficulty for QOL    Time  6    Period  Weeks    Status  New    Target Date  01/09/20      PT LONG TERM GOAL #5   Title  pt to be I with all HEP given as of last visit to maintain and progress current level of function    Time  6    Period  Weeks    Status  New    Target Date  01/09/20            Plan - 12/28/19 1242    Clinical Impression Statement  pt returns to PT following spinal injection 2 weeks ago. He reports mild improvement in pain noting today its 6/10. focused session hip hip strengthening and gait training to promote efficency with walking and functional lifting. He fatigued quickly with hip strengthening requiring frequent rest breaks.    PT Next Visit Plan  review/ update HEP PRN, STW along thoracolumbar paraspinals, core strengthening, posture education, modalities PRN, TPDN for low back,    PT Home Exercise Plan  [id number] - lower trunk rotation, supine posterior pelvic  tilt, low back stretch, supine<>sit log rolling, standing hip abduction/ extension and marching with theraband       Patient will benefit from skilled therapeutic intervention in order to improve the following deficits and impairments:     Visit Diagnosis: Chronic bilateral low back pain, unspecified whether sciatica present  Abnormal posture  Muscle spasm of back     Problem List Patient Active Problem List   Diagnosis Date Noted  . Polycythemia 12/20/2019  . Hypothyroidism 11/06/2019  . Hypokalemia 05/29/2019  . Migraine 08/12/2018  . HTN (hypertension) 08/12/2018  . Asthma  08/12/2018  . Male-to-male transgender person 08/12/2018  . Gender identity disorder 08/19/2016  . Left ankle pain 09/25/2013  . Hypovitaminosis D 09/25/2013    Lulu Riding PT, DPT, LAT, ATC  12/28/19  12:46 PM      [facility] [address] [place], [place], [address] Phone: [phone]   Fax:  [phone]  Name: Anthony Rowe MRN: [id number] Date of Birth: Sep 10, 1981

## 2020-01-08 ENCOUNTER — Encounter: Payer: Self-pay | Admitting: Physical Therapy

## 2020-01-08 ENCOUNTER — Other Ambulatory Visit: Payer: Self-pay

## 2020-01-08 ENCOUNTER — Ambulatory Visit: Payer: Medicare Other | Attending: Orthopedic Surgery | Admitting: Physical Therapy

## 2020-01-08 DIAGNOSIS — G8929 Other chronic pain: Secondary | ICD-10-CM

## 2020-01-08 DIAGNOSIS — M62830 Muscle spasm of back: Secondary | ICD-10-CM

## 2020-01-08 DIAGNOSIS — M545 Low back pain, unspecified: Secondary | ICD-10-CM

## 2020-01-08 DIAGNOSIS — R293 Abnormal posture: Secondary | ICD-10-CM

## 2020-01-09 NOTE — Therapy (Signed)
[facility] [address] [place], [place], [address] Phone: [phone]   Fax:  [phone]  Physical Therapy Treatment  Patient Details  Name: Anthony Rowe MRN: [id number] Date of Birth: 28-Jan-1981 Referring Provider (PT): Teryl Lucy MD   Encounter Date: 01/08/2020  PT End of Session - 01/08/20 1642    Visit Number  4    Number of Visits  13    Date for PT Re-Evaluation  01/09/20    Authorization Type  MCR: Kx mod at 15th visit progress note at 10th visit    PT Start Time  1635    PT Stop Time  1716    PT Time Calculation (min)  41 min    Activity Tolerance  Patient tolerated treatment well    Behavior During Therapy  [facility] for tasks assessed/performed       Past Medical History:  Diagnosis Date  . Asthma   . Bronchitis   . Endometriosis   . Hypertension   . Migraine   . Thyroid disease    hyperthyroidism per pt report    Past Surgical History:  Procedure Laterality Date  . ANKLE SURGERY    . MASTECTOMY Bilateral 2017   male to male     There were no vitals filed for this visit.  Subjective Assessment - 01/08/20 1640    Subjective  Patient is having pain today. He feels like the shot has wore off. He has had 2-3x where his legs have gone numb. It has been becoming numb more often.    Limitations  Lifting    How long can you sit comfortably?  1 hour    How long can you stand comfortably?  30 min    How long can you walk comfortably?  30 min    Diagnostic tests  x-ray and MRI at MD's office    Patient Stated Goals  strengthen the back, do more activity with decreased pain,    Currently in Pain?  Yes    Pain Score  7     Pain Location  Back    Pain Orientation  Right;Left    Pain Descriptors / Indicators  Aching    Pain Type  Chronic pain    Pain Radiating Towards  numbness into both legs    Pain Onset  More than a month ago    Pain Frequency  Intermittent    Aggravating Factors   bending forward     Pain Relieving Factors  bio-freeze and rubbing his legs    Effect of Pain on Daily Activities  increasing pain over time.                       [facility] Adult PT Treatment/Exercise - 01/09/20 0001      Lumbar Exercises: Stretches   Lower Trunk Rotation Limitations  1 x 15    Piriformis Stretch  2 reps;30 seconds    Other Lumbar Stretch Exercise  low back stretch done at the sink       Lumbar Exercises: Aerobic   Nustep  L5  x 6 min UE/LE      Lumbar Exercises: Standing   Other Standing Lumbar Exercises  scpa retraction with breathingx20; shoulder extension with breathing x20     Other Standing Lumbar Exercises  marching in place 1 x 20 stoped 2nd to cramping       Lumbar Exercises: Supine   AB Set Limitations  reviewed abdominla breathing  Bent Knee Raise Limitations  supine march 2x10      Manual Therapy   Manual Therapy  Soft tissue mobilization;Joint mobilization    Joint Mobilization  PA glides from L5 to L2 and mid thoracic gross mobilization     Soft tissue mobilization  IASTM lumbar paraspinals             PT Education - 01/08/20 1642    Education Details  reviewed HEP and stretching    Person(s) Educated  Patient    Methods  Explanation;Demonstration;Tactile cues;Verbal cues    Comprehension  Verbalized understanding;Returned demonstration;Verbal cues required;Tactile cues required       PT Short Term Goals - 11/28/19 0941      PT SHORT TERM GOAL #1   Title  pt to be I with inital HEP    Time  3    Period  Weeks    Status  New    Target Date  12/19/19      PT SHORT TERM GOAL #2   Title  pt to verbalize/ demo proper posture and lifting mechanics to reduce and prevent back pain    Time  3    Period  Weeks    Status  New    Target Date  12/19/19      PT SHORT TERM GOAL #3   Title  pt to reduce thoracolumbar paraspinal spasm to reduce pain to </= 6/10 max pain for functional progression    Time  3    Period  Weeks    Status  New     Target Date  12/19/19        PT Long Term Goals - 11/28/19 0943      PT LONG TERM GOAL #1   Title  pt to increase gross trunk flexion by >/= 20 degrees and extension by >/= 8 degrees for functional ROM required for ADLs and work activities    Time  6    Period  Weeks    Status  New    Target Date  01/09/20      PT LONG TERM GOAL #2   Title  pt to be able to sit/ stand/ walk for >/= 60 min with ,/= 3/10 max pain for functional endurance for work related tasks    Time  6    Period  Weeks    Status  New    Target Date  01/09/20      PT LONG TERM GOAL #3   Title  increase FOTO score to </= 49% limited to demo improvement in function    Time  6    Period  Weeks    Status  New      PT LONG TERM GOAL #4   Title  pt to be able to perform usual work and house hold activities with little difficulty for QOL    Time  6    Period  Weeks    Status  New    Target Date  01/09/20      PT LONG TERM GOAL #5   Title  pt to be I with all HEP given as of last visit to maintain and progress current level of function    Time  6    Period  Weeks    Status  New    Target Date  01/09/20            Plan - 01/09/20 1700    Clinical Impression Statement  Patient continues  to have pain and increasing parathesias over the last week. He tolerated exercises but reported some pain and cramping. He had only perfromed 2 LE exercises before he got cramping in his legs. He was advised to make sure he is drinking enough water before coming. Therapy  focused on manual therapy to decrease spasming in the back.    Personal Factors and Comorbidities  Past/Current Experience;Comorbidity 3+    Comorbidities  hx of HTN, Asthma, chronic low back pain    Examination-Activity Limitations  Lift;Bend;Stand;Locomotion Level    Stability/Clinical Decision Making  Unstable/Unpredictable    Clinical Decision Making  High    Rehab Potential  Good    PT Frequency  2x / week    PT Duration  6 weeks    PT  Treatment/Interventions  ADLs/Self Care Home Management;Electrical Stimulation;Iontophoresis 4mg /ml Dexamethasone;Moist Heat;Traction;Ultrasound;Gait training;Stair training;Therapeutic activities;Therapeutic exercise;Functional mobility training;Balance training;Neuromuscular re-education;Patient/family education;Manual techniques;Passive range of motion;Dry needling;Taping    PT Next Visit Plan  review/ update HEP PRN, STW along thoracolumbar paraspinals, core strengthening, posture education, modalities PRN, TPDN for low back,    PT Home Exercise Plan  [id number] - lower trunk rotation, supine posterior pelvic tilt, low back stretch, supine<>sit log rolling, standing hip abduction/ extension and marching with theraband    Consulted and Agree with Plan of Care  Patient       Patient will benefit from skilled therapeutic intervention in order to improve the following deficits and impairments:  Pain, Improper body mechanics, Increased muscle spasms, Abnormal gait, Postural dysfunction, Decreased balance  Visit Diagnosis: Chronic bilateral low back pain, unspecified whether sciatica present  Abnormal posture  Muscle spasm of back     Problem List Patient Active Problem List   Diagnosis Date Noted  . Polycythemia 12/20/2019  . Hypothyroidism 11/06/2019  . Hypokalemia 05/29/2019  . Migraine 08/12/2018  . HTN (hypertension) 08/12/2018  . Asthma 08/12/2018  . Male-to-male transgender person 08/12/2018  . Gender identity disorder 08/19/2016  . Left ankle pain 09/25/2013  . Hypovitaminosis D 09/25/2013    09/27/2013 PT DPT 01/09/2020, 11:34 AM  [facility] [address] [place], [place], [place] Phone: [phone]   Fax:  [phone]  Name: Anthony Rowe MRN: Jeris Penta Date of Birth: 03-29-1981

## 2020-01-10 ENCOUNTER — Other Ambulatory Visit: Payer: Self-pay

## 2020-01-10 ENCOUNTER — Encounter: Payer: Self-pay | Admitting: Physical Therapy

## 2020-01-10 ENCOUNTER — Ambulatory Visit: Payer: Medicare Other | Admitting: Physical Therapy

## 2020-01-10 DIAGNOSIS — M545 Low back pain, unspecified: Secondary | ICD-10-CM

## 2020-01-10 DIAGNOSIS — R293 Abnormal posture: Secondary | ICD-10-CM

## 2020-01-10 DIAGNOSIS — G8929 Other chronic pain: Secondary | ICD-10-CM

## 2020-01-10 DIAGNOSIS — M62830 Muscle spasm of back: Secondary | ICD-10-CM

## 2020-01-11 NOTE — Therapy (Signed)
[place], [place], [address] Phone: [phone]   Fax:  [phone]  Physical Therapy Treatment  Patient Details  Name: Anthony Rowe MRN: [id number] Date of Birth: 02/15/1981 Referring Provider (PT): Marchia Bond MD   Encounter Date: 01/10/2020  PT End of Session - 01/10/20 1448    Visit Number  5    Number of Visits  13    Date for PT Re-Evaluation  01/09/20    Authorization Type  MCR: Kx mod at 15th visit progress note at 10th visit    PT Start Time  1421    PT Stop Time  1500    PT Time Calculation (min)  39 min    Activity Tolerance  Patient tolerated treatment well    Behavior During Therapy  [facility] for tasks assessed/performed       Past Medical History:  Diagnosis Date  . Asthma   . Bronchitis   . Endometriosis   . Hypertension   . Migraine   . Thyroid disease    hyperthyroidism per pt report    Past Surgical History:  Procedure Laterality Date  . ANKLE SURGERY    . MASTECTOMY Bilateral 2017   male to male     There were no vitals filed for this visit.  Subjective Assessment - 01/10/20 1426    Subjective  Patients continues to have pain in his lower back. His pain level is about a 7/10. He hasn;t had any numbness in his legs since last visit.    Limitations  Lifting    How long can you sit comfortably?  1 hour    How long can you stand comfortably?  30 min    How long can you walk comfortably?  30 min    Diagnostic tests  x-ray and MRI at MD's office    Patient Stated Goals  strengthen the back, do more activity with decreased pain,    Currently in Pain?  Yes    Pain Score  7     Pain Location  Back    Pain Orientation  Right;Left    Pain Descriptors / Indicators  Aching    Pain Type  Chronic pain    Pain Onset  More than a month ago    Pain Frequency  Intermittent    Aggravating Factors   bending over    Pain Relieving Factors  bio-freeze    Effect of Pain on Daily  Activities  increasing pain over time                       [facility] Adult PT Treatment/Exercise - 01/11/20 0001      Lumbar Exercises: Stretches   Lower Trunk Rotation Limitations  1 x 15    Piriformis Stretch  2 reps;30 seconds    Other Lumbar Stretch Exercise  low back stretch done at the sink       Lumbar Exercises: Aerobic   Nustep  L5  x 6 min UE/LE      Lumbar Exercises: Standing   Other Standing Lumbar Exercises  scpa retraction with breathingx20; shoulder extension with breathing x20     Other Standing Lumbar Exercises  marching in place 1 x 20 stoped 2nd to cramping       Lumbar Exercises: Seated   Other Seated Lumbar Exercises  LAQ x20 red hamstring curl x20 red       Lumbar Exercises: Supine   AB Set Limitations  reviewed abdominla breathing     Bent Knee Raise Limitations  supine march 2x10    Other Supine Lumbar Exercises  supne clam shell x20 red     Other Supine Lumbar Exercises  double knee to chest with ball  2x10       Manual Therapy   Manual Therapy  Soft tissue mobilization;Joint mobilization    Joint Mobilization  PA glides from L5 to L2 and mid thoracic gross mobilization     Soft tissue mobilization  IASTM lumbar paraspinals             PT Education - 01/10/20 1447    Education Details  reviewed technique wiht HEP    Person(s) Educated  Patient    Methods  Demonstration;Tactile cues;Verbal cues;Explanation    Comprehension  Verbalized understanding;Returned demonstration;Verbal cues required;Tactile cues required       PT Short Term Goals - 01/11/20 1245      PT SHORT TERM GOAL #1   Title  pt to be I with inital HEP    Time  3    Period  Weeks    Status  On-going    Target Date  12/19/19      PT SHORT TERM GOAL #2   Title  pt to verbalize/ demo proper posture and lifting mechanics to reduce and prevent back pain    Time  3    Period  Weeks    Status  On-going      PT SHORT TERM GOAL #3   Title  pt to reduce  thoracolumbar paraspinal spasm to reduce pain to </= 6/10 max pain for functional progression    Time  3    Period  Weeks    Status  On-going    Target Date  12/19/19        PT Long Term Goals - 11/28/19 0943      PT LONG TERM GOAL #1   Title  pt to increase gross trunk flexion by >/= 20 degrees and extension by >/= 8 degrees for functional ROM required for ADLs and work activities    Time  6    Period  Weeks    Status  New    Target Date  01/09/20      PT LONG TERM GOAL #2   Title  pt to be able to sit/ stand/ walk for >/= 60 min with ,/= 3/10 max pain for functional endurance for work related tasks    Time  6    Period  Weeks    Status  New    Target Date  01/09/20      PT LONG TERM GOAL #3   Title  increase FOTO score to </= 49% limited to demo improvement in function    Time  6    Period  Weeks    Status  New      PT LONG TERM GOAL #4   Title  pt to be able to perform usual work and house hold activities with little difficulty for QOL    Time  6    Period  Weeks    Status  New    Target Date  01/09/20      PT LONG TERM GOAL #5   Title  pt to be I with all HEP given as of last visit to maintain and progress current level of function    Time  6    Period  Weeks    Status  New  Target Date  01/09/20            Plan - 01/10/20 1450    Clinical Impression Statement  Patients pain has centralized but he continues to have hihg levels of pian in the middle of his back. Therapy reviewed prayer stetch at the sink as well as his other stretches. therapy continues to endourage him to use his stretches to reduce pain and his exercises to keep it from coming back. Therapy will re-assess plan next visit.    Personal Factors and Comorbidities  Past/Current Experience;Comorbidity 3+    Comorbidities  hx of HTN, Asthma, chronic low back pain    Examination-Activity Limitations  Bed Mobility    Stability/Clinical Decision Making  Unstable/Unpredictable    Rehab Potential   Good    PT Frequency  2x / week    PT Duration  6 weeks    PT Treatment/Interventions  ADLs/Self Care Home Management;Electrical Stimulation;Iontophoresis 4mg /ml Dexamethasone;Moist Heat;Traction;Ultrasound;Gait training;Stair training;Therapeutic activities;Therapeutic exercise;Functional mobility training;Balance training;Neuromuscular re-education;Patient/family education;Manual techniques;Passive range of motion;Dry needling;Taping    PT Next Visit Plan  review/ update HEP PRN, STW along thoracolumbar paraspinals, core strengthening, posture education, modalities PRN, TPDN for low back,    PT Home Exercise Plan  [id number] - lower trunk rotation, supine posterior pelvic tilt, low back stretch, supine<>sit log rolling, standing hip abduction/ extension and marching with theraband       Patient will benefit from skilled therapeutic intervention in order to improve the following deficits and impairments:  Pain, Improper body mechanics, Increased muscle spasms, Abnormal gait, Postural dysfunction, Decreased balance  Visit Diagnosis: Chronic bilateral low back pain, unspecified whether sciatica present  Abnormal posture  Muscle spasm of back     Problem List Patient Active Problem List   Diagnosis Date Noted  . Polycythemia 12/20/2019  . Hypothyroidism 11/06/2019  . Hypokalemia 05/29/2019  . Migraine 08/12/2018  . HTN (hypertension) 08/12/2018  . Asthma 08/12/2018  . Male-to-male transgender person 08/12/2018  . Gender identity disorder 08/19/2016  . Left ankle pain 09/25/2013  . Hypovitaminosis D 09/25/2013    09/27/2013 PT DPT  01/11/2020, 12:51 PM  [facility] [address] [place], [place], [place] Phone: [phone]   Fax:  [phone]  Name: Tallie Hevia MRN: Jeris Penta Date of Birth: [birth year]

## 2020-01-16 ENCOUNTER — Ambulatory Visit: Payer: Medicare Other | Admitting: Physical Therapy

## 2020-01-16 ENCOUNTER — Other Ambulatory Visit: Payer: Self-pay

## 2020-01-16 DIAGNOSIS — G8929 Other chronic pain: Secondary | ICD-10-CM

## 2020-01-16 DIAGNOSIS — M545 Low back pain: Secondary | ICD-10-CM | POA: Diagnosis not present

## 2020-01-16 DIAGNOSIS — M62830 Muscle spasm of back: Secondary | ICD-10-CM

## 2020-01-16 DIAGNOSIS — R293 Abnormal posture: Secondary | ICD-10-CM

## 2020-01-16 NOTE — Therapy (Signed)
[place], [place], [address] Phone: [phone]   Fax:  [phone]  Physical Therapy Treatment / Discharge  Patient Details  Name: Keller Bounds MRN: [id number] Date of Birth: April 06, 1981 Referring Provider (PT): Marchia Bond MD   Encounter Date: 01/16/2020  PT End of Session - 01/16/20 1117    Visit Number  6    Number of Visits  13    Date for PT Re-Evaluation  01/16/20    Authorization Type  MCR: Kx mod at 15th visit progress note at 10th visit    PT Start Time  1116   pt arrived 16 min late   PT Stop Time  1141    PT Time Calculation (min)  25 min    Activity Tolerance  Patient tolerated treatment well    Behavior During Therapy  [facility] for tasks assessed/performed       Past Medical History:  Diagnosis Date  . Asthma   . Bronchitis   . Endometriosis   . Hypertension   . Migraine   . Thyroid disease    hyperthyroidism per pt report    Past Surgical History:  Procedure Laterality Date  . ANKLE SURGERY    . MASTECTOMY Bilateral 2017   male to male     There were no vitals filed for this visit.  Subjective Assessment - 01/16/20 1118    Subjective  "I am still getting numbness in the RLE, the legs are still going out again"    Patient Stated Goals  strengthen the back, do more activity with decreased pain,    Currently in Pain?  Yes    Pain Score  7     Pain Location  Back    Pain Orientation  Right;Left    Pain Descriptors / Indicators  Aching    Pain Type  Chronic pain    Pain Onset  More than a month ago    Pain Frequency  Intermittent    Aggravating Factors   bending over         [facility] PT Assessment - 01/16/20 0001      Assessment   Medical Diagnosis  low back pain    Referring Provider (PT)  Marchia Bond MD      Observation/Other Assessments   Focus on Therapeutic Outcomes (FOTO)   57% limited      AROM   Lumbar Flexion  18    Lumbar Extension  18    Lumbar -  Right Side Bend  18    Lumbar - Left Side Bend  12                           PT Education - 01/16/20 1201    Education Details  reviewed HEP and posture to maintain current level of function    Person(s) Educated  Patient    Methods  Explanation;Verbal cues    Comprehension  Verbalized understanding;Verbal cues required       PT Short Term Goals - 01/16/20 1124      PT SHORT TERM GOAL #1   Title  pt to be I with inital HEP    Period  Weeks    Status  Achieved      PT SHORT TERM GOAL #2   Title  pt to verbalize/ demo proper posture and lifting mechanics to reduce and prevent back pain    Period  Weeks    Status  Not Met      PT SHORT TERM GOAL #3   Title  pt to reduce thoracolumbar paraspinal spasm to reduce pain to </= 6/10 max pain for functional progression    Period  Weeks    Status  Not Met        PT Long Term Goals - 01/16/20 1125      PT LONG TERM GOAL #1   Title  pt to increase gross trunk flexion by >/= 20 degrees and extension by >/= 8 degrees for functional ROM required for ADLs and work activities    Period  Weeks    Status  Not Met      PT LONG TERM GOAL #2   Title  pt to be able to sit/ stand/ walk for >/= 60 min with ,/= 3/10 max pain for functional endurance for work related tasks    Baseline  30 min    Status  Not Met      PT LONG TERM GOAL #3   Title  increase FOTO score to </= 49% limited to demo improvement in function    Period  Weeks    Status  Not Met      PT LONG TERM GOAL #4   Title  pt to be able to perform usual work and house hold activities with little difficulty for QOL    Period  Weeks    Status  Not Met      PT LONG TERM GOAL #5   Title  pt to be I with all HEP given as of last visit to maintain and progress current level of function    Period  Weeks            Plan - 01/16/20 1203    Clinical Impression Statement  pt continues to report conitnued pain rated at a 7/10 and continues to report RLE  referred pain and N/T which no specific cause. He continues to demonstrate limited trunk ROM in all planes with functional improvement, and continues to demonstrate continued weakness in bil LE. based on continued LBP and RLE referral with no improvement plan to discharge from PT based on limited functional progress and refer back to MD  for further assessment and pt agreed.    PT Treatment/Interventions  ADLs/Self Care Home Management;Electrical Stimulation;Iontophoresis '4mg'$ /ml Dexamethasone;Moist Heat;Traction;Ultrasound;Gait training;Stair training;Therapeutic activities;Therapeutic exercise;Functional mobility training;Balance training;Neuromuscular re-education;Patient/family education;Manual techniques;Passive range of motion;Dry needling;Taping    PT Next Visit Plan  D/C    PT Home Exercise Plan  [id number] - lower trunk rotation, supine posterior pelvic tilt, low back stretch, supine<>sit log rolling, standing hip abduction/ extension and marching with theraband    Consulted and Agree with Plan of Care  Patient       Patient will benefit from skilled therapeutic intervention in order to improve the following deficits and impairments:  Pain, Improper body mechanics, Increased muscle spasms, Abnormal gait, Postural dysfunction, Decreased balance  Visit Diagnosis: Chronic bilateral low back pain, unspecified whether sciatica present  Abnormal posture  Muscle spasm of back     Problem List Patient Active Problem List   Diagnosis Date Noted  . Polycythemia 12/20/2019  . Hypothyroidism 11/06/2019  . Hypokalemia 05/29/2019  . Migraine 08/12/2018  . HTN (hypertension) 08/12/2018  . Asthma 08/12/2018  . Male-to-male transgender person 08/12/2018  . Gender identity disorder 08/19/2016  . Left ankle pain 09/25/2013  . Hypovitaminosis D 09/25/2013    [place] 01/16/2020, 12:09 PM  [facility]  [place],  [place], [address] Phone: [phone]   Fax:  [phone]  Name: Keithen Capo MRN: [id number] Date of Birth: 1981-03-12       PHYSICAL THERAPY DISCHARGE SUMMARY  Visits from Start of Care: 6  Current functional level related to goals / functional outcomes: See goals, 57% limited   Remaining deficits: Continued low back pain with RLE referred symptoms, intermittent buckling of bil LE and associated weakness. Limited trunk mobility with pain in all planes. Limited standing/ walking endurance.    Education / Equipment: HEP, theraband, posture and lifting mechanics,   Plan: Patient agrees to discharge.  Patient goals were not met. Patient is being discharged due to lack of progress.  ?????        Banesa Tristan PT, DPT, LAT, ATC  01/16/20  12:10 PM

## 2020-01-18 ENCOUNTER — Ambulatory Visit: Payer: Medicare Other | Admitting: Physical Therapy

## 2020-02-09 ENCOUNTER — Ambulatory Visit (INDEPENDENT_AMBULATORY_CARE_PROVIDER_SITE_OTHER)
Admission: EM | Admit: 2020-02-09 | Discharge: 2020-02-09 | Disposition: A | Discharge status: Home / Self Care | Payer: Medicare Other | Source: Home / Self Care

## 2020-02-09 ENCOUNTER — Emergency Department (HOSPITAL_COMMUNITY): Payer: Medicare Other

## 2020-02-09 ENCOUNTER — Other Ambulatory Visit: Payer: Self-pay

## 2020-02-09 ENCOUNTER — Inpatient Hospital Stay (HOSPITAL_COMMUNITY)
Admission: EM | Admit: 2020-02-09 | Discharge: 2020-02-14 | DRG: 177 | Disposition: A | Discharge status: Home / Self Care | Payer: Medicare Other | Attending: Internal Medicine | Admitting: Internal Medicine

## 2020-02-09 ENCOUNTER — Encounter (HOSPITAL_COMMUNITY): Payer: Self-pay | Admitting: Emergency Medicine

## 2020-02-09 DIAGNOSIS — Z888 Allergy status to other drugs, medicaments and biological substances status: Secondary | ICD-10-CM

## 2020-02-09 DIAGNOSIS — Z789 Other specified health status: Secondary | ICD-10-CM | POA: Diagnosis present

## 2020-02-09 DIAGNOSIS — Z6833 Body mass index (BMI) 33.0-33.9, adult: Secondary | ICD-10-CM

## 2020-02-09 DIAGNOSIS — U071 COVID-19: Secondary | ICD-10-CM

## 2020-02-09 DIAGNOSIS — E669 Obesity, unspecified: Secondary | ICD-10-CM | POA: Diagnosis present

## 2020-02-09 DIAGNOSIS — E860 Dehydration: Secondary | ICD-10-CM | POA: Diagnosis present

## 2020-02-09 DIAGNOSIS — J45909 Unspecified asthma, uncomplicated: Secondary | ICD-10-CM | POA: Diagnosis present

## 2020-02-09 DIAGNOSIS — R0602 Shortness of breath: Secondary | ICD-10-CM | POA: Diagnosis not present

## 2020-02-09 DIAGNOSIS — R112 Nausea with vomiting, unspecified: Secondary | ICD-10-CM

## 2020-02-09 DIAGNOSIS — J1282 Pneumonia due to coronavirus disease 2019: Secondary | ICD-10-CM | POA: Diagnosis present

## 2020-02-09 DIAGNOSIS — J069 Acute upper respiratory infection, unspecified: Secondary | ICD-10-CM | POA: Diagnosis present

## 2020-02-09 DIAGNOSIS — F418 Other specified anxiety disorders: Secondary | ICD-10-CM | POA: Diagnosis present

## 2020-02-09 DIAGNOSIS — F648 Other gender identity disorders: Secondary | ICD-10-CM | POA: Diagnosis present

## 2020-02-09 DIAGNOSIS — F640 Transsexualism: Secondary | ICD-10-CM | POA: Diagnosis present

## 2020-02-09 DIAGNOSIS — Z9013 Acquired absence of bilateral breasts and nipples: Secondary | ICD-10-CM

## 2020-02-09 DIAGNOSIS — Z79890 Hormone replacement therapy (postmenopausal): Secondary | ICD-10-CM

## 2020-02-09 DIAGNOSIS — J9601 Acute respiratory failure with hypoxia: Secondary | ICD-10-CM | POA: Diagnosis present

## 2020-02-09 DIAGNOSIS — Z79899 Other long term (current) drug therapy: Secondary | ICD-10-CM

## 2020-02-09 DIAGNOSIS — E890 Postprocedural hypothyroidism: Secondary | ICD-10-CM | POA: Diagnosis present

## 2020-02-09 DIAGNOSIS — Z8249 Family history of ischemic heart disease and other diseases of the circulatory system: Secondary | ICD-10-CM

## 2020-02-09 DIAGNOSIS — R197 Diarrhea, unspecified: Secondary | ICD-10-CM

## 2020-02-09 DIAGNOSIS — N179 Acute kidney failure, unspecified: Secondary | ICD-10-CM | POA: Diagnosis present

## 2020-02-09 DIAGNOSIS — Z833 Family history of diabetes mellitus: Secondary | ICD-10-CM

## 2020-02-09 DIAGNOSIS — E66811 Obesity, class 1: Secondary | ICD-10-CM | POA: Diagnosis present

## 2020-02-09 DIAGNOSIS — G43909 Migraine, unspecified, not intractable, without status migrainosus: Secondary | ICD-10-CM | POA: Diagnosis present

## 2020-02-09 DIAGNOSIS — Z882 Allergy status to sulfonamides status: Secondary | ICD-10-CM

## 2020-02-09 DIAGNOSIS — Z7952 Long term (current) use of systemic steroids: Secondary | ICD-10-CM

## 2020-02-09 DIAGNOSIS — Z801 Family history of malignant neoplasm of trachea, bronchus and lung: Secondary | ICD-10-CM

## 2020-02-09 DIAGNOSIS — E039 Hypothyroidism, unspecified: Secondary | ICD-10-CM | POA: Diagnosis present

## 2020-02-09 DIAGNOSIS — I10 Essential (primary) hypertension: Secondary | ICD-10-CM | POA: Diagnosis present

## 2020-02-09 LAB — COMPREHENSIVE METABOLIC PANEL
ALT: 36 U/L (ref 0–44)
AST: 54 U/L — ABNORMAL HIGH (ref 15–41)
Albumin: 3.6 g/dL (ref 3.5–5.0)
Alkaline Phosphatase: 48 U/L (ref 38–126)
Anion gap: 15 (ref 5–15)
BUN: 20 mg/dL (ref 6–20)
CO2: 23 mmol/L (ref 22–32)
Calcium: 8.7 mg/dL — ABNORMAL LOW (ref 8.9–10.3)
Chloride: 98 mmol/L (ref 98–111)
Creatinine, Ser: 1.59 mg/dL — ABNORMAL HIGH (ref 0.61–1.24)
GFR calc Af Amer: >60 mL/min (ref >60)
GFR calc non Af Amer: 54 mL/min — ABNORMAL LOW (ref >60)
Glucose, Bld: 99 mg/dL (ref 70–99)
Potassium: 3.4 mmol/L — ABNORMAL LOW (ref 3.5–5.1)
Sodium: 136 mmol/L (ref 135–145)
Total Bilirubin: 1.4 mg/dL — ABNORMAL HIGH (ref 0.3–1.2)
Total Protein: 7.3 g/dL (ref 6.5–8.1)

## 2020-02-09 LAB — CBC WITH DIFFERENTIAL/PLATELET
Abs Immature Granulocytes: 0.02 10*3/uL (ref 0.00–0.07)
Basophils Absolute: 0 10*3/uL (ref 0.0–0.1)
Basophils Relative: 0 %
Eosinophils Absolute: 0 10*3/uL (ref 0.0–0.5)
Eosinophils Relative: 0 %
HCT: 46 % (ref 39.0–52.0)
Hemoglobin: 15.9 g/dL (ref 13.0–17.0)
Immature Granulocytes: 0 %
Lymphocytes Relative: 19 %
Lymphs Abs: 0.9 10*3/uL (ref 0.7–4.0)
MCH: 31 pg (ref 26.0–34.0)
MCHC: 34.6 g/dL (ref 30.0–36.0)
MCV: 89.7 fL (ref 80.0–100.0)
Monocytes Absolute: 0.2 10*3/uL (ref 0.1–1.0)
Monocytes Relative: 4 %
Neutro Abs: 3.7 10*3/uL (ref 1.7–7.7)
Neutrophils Relative %: 77 %
Platelets: 305 10*3/uL (ref 150–400)
RBC: 5.13 MIL/uL (ref 4.22–5.81)
RDW: 12.4 % (ref 11.5–15.5)
Smear Review: ADEQUATE
WBC: 4.9 10*3/uL (ref 4.0–10.5)
nRBC: 0 % (ref 0.0–0.2)

## 2020-02-09 MED ORDER — ALBUTEROL SULFATE HFA 108 (90 BASE) MCG/ACT IN AERS: INHALATION_SPRAY | RESPIRATORY_TRACT | 0 refills | Status: DC

## 2020-02-09 MED ORDER — FLOVENT HFA 220 MCG/ACT IN AERO: INHALATION_SPRAY | RESPIRATORY_TRACT | 0 refills | Status: AC

## 2020-02-09 NOTE — ED Triage Notes (Signed)
Patient tested positive for COVID19  Mar. 29 , 2021 , reports persistent chills , body aches , diarrhea, fatigue , and productive cough for several days .

## 2020-02-09 NOTE — ED Triage Notes (Signed)
Pt reports he tested positive for COVID on 03/29 and continues to have worsening cough, SOB, n/v/d and chills.  Pt states his allergy M.D. Rx two inhaler Rx but pt didn't pick them up today.  Pt was breathing approx 20/min in waiting room, upon entering tx room and describing symptoms, pt began breathing faster/shallower.  Lungs sounds clear, SpO2 100% Pt reports he hasn't drank/ate in approx 1week.

## 2020-02-09 NOTE — ED Provider Notes (Signed)
[place]    CSN: [id number] Arrival date & time: 02/09/20  1913      History   Chief Complaint Chief Complaint  Patient presents with  . Shortness of Breath  . Emesis    HPI Hampton Granberg is a 39 y.o. adult.   Patient reports urgent care for worsening COVID-19 symptoms to include nausea, vomiting, diarrhea, cough, shortness of breath.  Patient tested positive on 01/29/2020 for Covid.  Reports symptoms have gradually continued to worsen.  Reports over the last 1 week has had minimal food or water stay down.  Has had diarrhea throughout the course..  Denies any blood in stool or vomit.  Has had worsening shortness of breath with productive cough.  Endorses chest pain cough.  Reports was prescribed inhalers however did not fill these.  Patient reports they feel as though they are going to pass out.  Generally states that patient is feeling significantly worse.     Past Medical History:  Diagnosis Date  . Asthma   . Bronchitis   . Endometriosis   . Hypertension   . Migraine   . Thyroid disease    hyperthyroidism per pt report    Patient Active Problem List   Diagnosis Date Noted  . Polycythemia 12/20/2019  . Hypothyroidism 11/06/2019  . Hypokalemia 05/29/2019  . Migraine 08/12/2018  . HTN (hypertension) 08/12/2018  . Asthma 08/12/2018  . Male-to-male transgender person 08/12/2018  . Gender identity disorder 08/19/2016  . Left ankle pain 09/25/2013  . Hypovitaminosis D 09/25/2013    Past Surgical History:  Procedure Laterality Date  . ANKLE SURGERY    . MASTECTOMY Bilateral 2017   male to male        Home Medications    Prior to Admission medications   Medication Sig Start Date End Date Taking? Authorizing Provider  albuterol (VENTOLIN HFA) 108 (90 Base) MCG/ACT inhaler Inhale 2 puffs every 4-6 hours as needed for cough, wheeze, shortness of breath or chest tightness. 02/09/20   Kennith Gain, MD  buPROPion (WELLBUTRIN XL) 150  MG 24 hr tablet Take 150 mg by mouth daily. 12/05/19   [provider]  busPIRone (BUSPAR) 10 MG tablet Take 10 mg by mouth 2 (two) times daily. 11/14/19   [provider]  cyclobenzaprine (FLEXERIL) 10 MG tablet Take by mouth. 01/25/13   [provider]  Elagolix Sodium 150 MG TABS Take 150 mg by mouth daily. 10/06/19   Renato Shin, MD  famotidine (PEPCID) 20 MG tablet Take 1 tablet (20 mg total) by mouth 2 (two) times daily. 07/26/19   Kennith Gain, MD  fluticasone (FLONASE) 50 MCG/ACT nasal spray Place 1 spray into both nostrils daily. 07/14/19   Zigmund Gottron, NP  fluticasone (FLOVENT HFA) 220 MCG/ACT inhaler Inhale 2 puffs into the lungs twice daily 02/09/20   Kennith Gain, MD  gabapentin (NEURONTIN) 100 MG capsule SMARTSIG:2 Capsule(s) By Mouth Every Evening 01/10/20   [provider]  hydrochlorothiazide (HYDRODIURIL) 25 MG tablet hydrochlorothiazide 25 mg tablet  Take 1 tablet every day by oral route.    [provider]  hydrOXYzine (ATARAX/VISTARIL) 25 MG tablet Take 1-2 tablets (25-50 mg total) by mouth at bedtime as needed. 07/26/19   Kennith Gain, MD  ibuprofen (ADVIL,MOTRIN) 800 MG tablet Take 800-1,600 mg by mouth every 8 (eight) hours as needed for headache, mild pain or moderate pain (migraine).     [provider]  levocetirizine (XYZAL) 5 MG tablet Take  1 tablet (5 mg total) by mouth 2 (two) times daily. 03/30/19   Marcelyn Bruins, MD  levothyroxine (SYNTHROID) 175 MCG tablet Take 1 tablet (175 mcg total) by mouth daily before breakfast. 12/20/19   Romero Belling, MD  lisinopril (PRINIVIL,ZESTRIL) 20 MG tablet Take 1 tablet (20 mg total) by mouth daily. 08/06/15   Renne Crigler, PA-C  loperamide (IMODIUM) 2 MG capsule Take 1 capsule (2 mg total) by mouth daily as needed for diarrhea or loose stools. 07/18/19   Wallis Bamberg, PA-C  metoprolol succinate (TOPROL-XL) 100 MG 24 hr tablet TAKE 1  TABLET BY MOUTH DAILY. SEND FUTURE REFILLS TO PCP 11/06/19   Romero Belling, MD  montelukast (SINGULAIR) 10 MG tablet Take 1 tablet (10 mg total) by mouth at bedtime. 07/26/19   Marcelyn Bruins, MD  ondansetron (ZOFRAN-ODT) 8 MG disintegrating tablet Take 1 tablet (8 mg total) by mouth every 8 (eight) hours as needed for nausea or vomiting. 07/18/19   Wallis Bamberg, PA-C  rizatriptan (MAXALT) 10 MG tablet Take 10 mg by mouth as needed. 01/02/20   [provider]  testosterone cypionate (DEPOTESTOTERONE CYPIONATE) 100 MG/ML injection 40 mg every 10 days, and syringes 1 every 10 days 12/22/19   Romero Belling, MD  traZODone (DESYREL) 100 MG tablet Take 100 mg by mouth at bedtime as needed for sleep.     [provider]  triamcinolone cream (KENALOG) 0.1 % Apply 1 application topically 4 (four) times daily. As needed for itching 07/26/19   Romero Belling, MD    Family History Family History  Problem Relation Age of Onset  . Cancer Mother        lung  . Hypertension Maternal Grandmother   . Diabetes Maternal Grandmother   . Thyroid disease Neg Hx     Social History Social History   Tobacco Use  . Smoking status: Never Smoker  . Smokeless tobacco: Never Used  Substance Use Topics  . Alcohol use: No  . Drug use: No     Allergies   Frovatriptan and Sulfa antibiotics   Review of Systems Review of Systems  See HPI Physical Exam Triage Vital Signs ED Triage Vitals  Enc Vitals Group     BP 02/09/20 2048 98/81     Pulse Rate 02/09/20 2048 92     Resp 02/09/20 2048 (!) 24     Temp 02/09/20 2048 99.2 F (37.3 C)     Temp Source 02/09/20 2048 Oral     SpO2 02/09/20 2048 100 %     Weight --      Height --      Head Circumference --      Peak Flow --      Pain Score 02/09/20 2049 8     Pain Loc --      Pain Edu? --      Excl. in GC? --    No data found.  Updated Vital Signs BP 101/67 (BP Location: Right Arm) Comment: re-eval  Pulse 92   Temp 99.2 F (37.3  C) (Oral)   Resp (!) 24   SpO2 100%   Visual Acuity Right Eye Distance:   Left Eye Distance:   Bilateral Distance:    Right Eye Near:   Left Eye Near:    Bilateral Near:     Physical Exam Vitals and nursing note reviewed.  Constitutional:      Appearance: He is well-developed. He is ill-appearing.  HENT:     Head: Normocephalic  and atraumatic.  Eyes:     Conjunctiva/sclera: Conjunctivae normal.  Cardiovascular:     Rate and Rhythm: Normal rate.  Pulmonary:     Effort: Tachypnea and respiratory distress present.     Comments: Some labored speech due to tachypnea. Abdominal:     Palpations: Abdomen is soft.     Tenderness: There is abdominal tenderness.  Musculoskeletal:     Cervical back: Neck supple.  Skin:    General: Skin is warm and dry.  Neurological:     General: No focal deficit present.     Mental Status: He is alert and oriented to person, place, and time.      UC Treatments / Results  Labs (all labs ordered are listed, but only abnormal results are displayed) Labs Reviewed - No data to display  EKG   Radiology No results found.  Procedures Procedures (including critical care time)  Medications Ordered in UC Medications - No data to display  Initial Impression / Assessment and Plan / UC Course  I have reviewed the triage vital signs and the nursing notes.  Pertinent labs & imaging results that were available during my care of the patient were reviewed by me and considered in my medical decision making (see chart for details).  Chart review conducted for COVID-19 testing on 01/29/2020.    #COVID-19 #Nausea vomiting diarrhea #Shortness of breath #Dehydration Patient is a 39 year old transman presenting with worsening COVID-19 symptoms.  Given tachypnea, recent intolerance of p.o. fluids and intake and reporting of shortness of breath with soft pressures at 101/67 (baseline 130s) discussed with patient that they should have further evaluation  in the emergency department.  Patient is hemodynamically stable for self transport to the [facility] emergency department agrees to report there following discharge.   Final Clinical Impressions(s) / UC Diagnoses   Final diagnoses:  Nausea vomiting and diarrhea  COVID-19  Dehydration  Shortness of breath     Discharge Instructions     Please report to the [facility] Emergency Room following discharge from Urgent care. You need to be monitored further and likely have IV rehydration.    ED Prescriptions    None     PDMP not reviewed this encounter.   Hermelinda Medicus, PA-C 02/10/20 1737

## 2020-02-09 NOTE — Discharge Instructions (Addendum)
Please report to the [facility] Emergency Room following discharge from Urgent care. You need to be monitored further and likely have IV rehydration.

## 2020-02-09 NOTE — Telephone Encounter (Signed)
Patient called requesting refills on inhalers due to having COVID. I informed patient that he was due for an appointment and that I would send in 1 refill of each inhaler but he would need to call and schedule a follow up appointment once he was feeling better in order to receive further refills. Patient voiced understanding.

## 2020-02-09 NOTE — ED Notes (Signed)
Patient is being discharged from the Urgent Care Center and sent to the Emergency Department via POV. Per Omer Jack, PA patient is stable but in need of higher level of care due to dehydration, cough, fever, SOB  Patient is aware and verbalizes understanding of plan of care.  Vitals:   02/09/20 2048 02/09/20 2050  BP: 98/81 101/67  Pulse: 92   Resp: (!) 24   Temp: 99.2 F (37.3 C)   SpO2: 100%

## 2020-02-10 ENCOUNTER — Other Ambulatory Visit: Payer: Self-pay | Admitting: Endocrinology

## 2020-02-10 DIAGNOSIS — Z79890 Hormone replacement therapy (postmenopausal): Secondary | ICD-10-CM | POA: Diagnosis not present

## 2020-02-10 DIAGNOSIS — R0602 Shortness of breath: Secondary | ICD-10-CM | POA: Diagnosis present

## 2020-02-10 DIAGNOSIS — U071 COVID-19: Principal | ICD-10-CM

## 2020-02-10 DIAGNOSIS — I10 Essential (primary) hypertension: Secondary | ICD-10-CM | POA: Diagnosis present

## 2020-02-10 DIAGNOSIS — J1282 Pneumonia due to coronavirus disease 2019: Secondary | ICD-10-CM | POA: Diagnosis present

## 2020-02-10 DIAGNOSIS — F648 Other gender identity disorders: Secondary | ICD-10-CM | POA: Diagnosis present

## 2020-02-10 DIAGNOSIS — Z833 Family history of diabetes mellitus: Secondary | ICD-10-CM | POA: Diagnosis not present

## 2020-02-10 DIAGNOSIS — F418 Other specified anxiety disorders: Secondary | ICD-10-CM | POA: Diagnosis present

## 2020-02-10 DIAGNOSIS — Z801 Family history of malignant neoplasm of trachea, bronchus and lung: Secondary | ICD-10-CM | POA: Diagnosis not present

## 2020-02-10 DIAGNOSIS — F640 Transsexualism: Secondary | ICD-10-CM | POA: Diagnosis not present

## 2020-02-10 DIAGNOSIS — E669 Obesity, unspecified: Secondary | ICD-10-CM | POA: Diagnosis present

## 2020-02-10 DIAGNOSIS — E860 Dehydration: Secondary | ICD-10-CM | POA: Diagnosis present

## 2020-02-10 DIAGNOSIS — G43909 Migraine, unspecified, not intractable, without status migrainosus: Secondary | ICD-10-CM | POA: Diagnosis present

## 2020-02-10 DIAGNOSIS — J9601 Acute respiratory failure with hypoxia: Secondary | ICD-10-CM | POA: Diagnosis present

## 2020-02-10 DIAGNOSIS — E890 Postprocedural hypothyroidism: Secondary | ICD-10-CM | POA: Diagnosis present

## 2020-02-10 DIAGNOSIS — Z882 Allergy status to sulfonamides status: Secondary | ICD-10-CM | POA: Diagnosis not present

## 2020-02-10 DIAGNOSIS — Z888 Allergy status to other drugs, medicaments and biological substances status: Secondary | ICD-10-CM | POA: Diagnosis not present

## 2020-02-10 DIAGNOSIS — Z9013 Acquired absence of bilateral breasts and nipples: Secondary | ICD-10-CM | POA: Diagnosis not present

## 2020-02-10 DIAGNOSIS — Z7952 Long term (current) use of systemic steroids: Secondary | ICD-10-CM | POA: Diagnosis not present

## 2020-02-10 DIAGNOSIS — Z79899 Other long term (current) drug therapy: Secondary | ICD-10-CM | POA: Diagnosis not present

## 2020-02-10 DIAGNOSIS — Z8249 Family history of ischemic heart disease and other diseases of the circulatory system: Secondary | ICD-10-CM | POA: Diagnosis not present

## 2020-02-10 DIAGNOSIS — J069 Acute upper respiratory infection, unspecified: Secondary | ICD-10-CM | POA: Diagnosis not present

## 2020-02-10 DIAGNOSIS — J45909 Unspecified asthma, uncomplicated: Secondary | ICD-10-CM | POA: Diagnosis present

## 2020-02-10 DIAGNOSIS — Z6833 Body mass index (BMI) 33.0-33.9, adult: Secondary | ICD-10-CM | POA: Diagnosis not present

## 2020-02-10 DIAGNOSIS — N179 Acute kidney failure, unspecified: Secondary | ICD-10-CM | POA: Diagnosis present

## 2020-02-10 LAB — LACTIC ACID, PLASMA
Lactic Acid, Venous: 1.1 mmol/L (ref 0.5–1.9)
Lactic Acid, Venous: 1 mmol/L (ref 0.5–1.9)

## 2020-02-10 LAB — PROCALCITONIN: Procalcitonin: 0.11 ng/mL

## 2020-02-10 LAB — TRIGLYCERIDES: Triglycerides: 201 mg/dL — ABNORMAL HIGH (ref <150)

## 2020-02-10 LAB — D-DIMER, QUANTITATIVE: D-Dimer, Quant: 0.75 ug/mL-FEU — ABNORMAL HIGH (ref 0.00–0.50)

## 2020-02-10 LAB — FERRITIN: Ferritin: 509 ng/mL — ABNORMAL HIGH (ref 24–336)

## 2020-02-10 LAB — FIBRINOGEN: Fibrinogen: 755 mg/dL — ABNORMAL HIGH (ref 210–475)

## 2020-02-10 LAB — TSH: TSH: 24.877 u[IU]/mL — ABNORMAL HIGH (ref 0.350–4.500)

## 2020-02-10 LAB — HIV ANTIBODY (ROUTINE TESTING W REFLEX): HIV Screen 4th Generation wRfx: NONREACTIVE

## 2020-02-10 LAB — C-REACTIVE PROTEIN: CRP: 4.1 mg/dL — ABNORMAL HIGH (ref <1.0)

## 2020-02-10 LAB — T4, FREE: Free T4: 0.37 ng/dL — ABNORMAL LOW (ref 0.61–1.12)

## 2020-02-10 LAB — LACTATE DEHYDROGENASE: LDH: 478 U/L — ABNORMAL HIGH (ref 98–192)

## 2020-02-10 MED ORDER — SODIUM CHLORIDE 0.9 % IV SOLN
100.0000 mg | Freq: Every day | INTRAVENOUS | Status: AC
  Administered 2020-02-11 – 2020-02-14 (×4): 100 mg via INTRAVENOUS
  Filled 2020-02-10 (×4): qty 20

## 2020-02-10 MED ORDER — ALBUTEROL SULFATE HFA 108 (90 BASE) MCG/ACT IN AERS
2.0000 | INHALATION_SPRAY | RESPIRATORY_TRACT | Status: DC | PRN
  Filled 2020-02-10: qty 6.7

## 2020-02-10 MED ORDER — ELAGOLIX SODIUM 150 MG PO TABS: 150.0000 mg | ORAL_TABLET | Freq: Every day | ORAL | Status: DC

## 2020-02-10 MED ORDER — HYDROXYZINE HCL 25 MG PO TABS
25.0000 mg | ORAL_TABLET | Freq: Every evening | ORAL | Status: DC | PRN
  Administered 2020-02-13: 50 mg via ORAL
  Filled 2020-02-10: qty 2

## 2020-02-10 MED ORDER — GUAIFENESIN-DM 100-10 MG/5ML PO SYRP: 10.0000 mL | ORAL_SOLUTION | ORAL | Status: DC | PRN

## 2020-02-10 MED ORDER — TRAZODONE HCL 100 MG PO TABS
100.0000 mg | ORAL_TABLET | Freq: Every evening | ORAL | Status: DC | PRN
  Filled 2020-02-10 (×3): qty 1

## 2020-02-10 MED ORDER — LORATADINE 10 MG PO TABS
5.0000 mg | ORAL_TABLET | Freq: Two times a day (BID) | ORAL | Status: DC
  Administered 2020-02-10 – 2020-02-14 (×8): 5 mg via ORAL
  Filled 2020-02-10 (×8): qty 1

## 2020-02-10 MED ORDER — FLUTICASONE PROPIONATE 50 MCG/ACT NA SUSP
1.0000 | Freq: Every day | NASAL | Status: DC
  Filled 2020-02-10: qty 16

## 2020-02-10 MED ORDER — ALBUTEROL SULFATE HFA 108 (90 BASE) MCG/ACT IN AERS
6.0000 | INHALATION_SPRAY | RESPIRATORY_TRACT | Status: AC
  Administered 2020-02-10 (×3): 6 via RESPIRATORY_TRACT
  Filled 2020-02-10: qty 6.7

## 2020-02-10 MED ORDER — AEROCHAMBER PLUS FLO-VU LARGE MISC
1.0000 | Freq: Once | Status: AC
  Administered 2020-02-10: 1

## 2020-02-10 MED ORDER — TESTOSTERONE CYPIONATE 100 MG/ML IM SOLN: 40.0000 mg | INTRAMUSCULAR | Status: DC

## 2020-02-10 MED ORDER — GABAPENTIN 100 MG PO CAPS
200.0000 mg | ORAL_CAPSULE | Freq: Every day | ORAL | Status: DC
  Administered 2020-02-10 – 2020-02-13 (×4): 200 mg via ORAL
  Filled 2020-02-10 (×4): qty 2

## 2020-02-10 MED ORDER — OXYCODONE HCL 5 MG PO TABS
5.0000 mg | ORAL_TABLET | ORAL | Status: DC | PRN
  Administered 2020-02-10: 5 mg via ORAL
  Filled 2020-02-10: qty 1

## 2020-02-10 MED ORDER — BUPROPION HCL ER (XL) 150 MG PO TB24
150.0000 mg | ORAL_TABLET | Freq: Every day | ORAL | Status: DC
  Administered 2020-02-10 – 2020-02-14 (×5): 150 mg via ORAL
  Filled 2020-02-10 (×5): qty 1

## 2020-02-10 MED ORDER — LOPERAMIDE HCL 2 MG PO CAPS
2.0000 mg | ORAL_CAPSULE | Freq: Every day | ORAL | Status: DC | PRN
  Administered 2020-02-14: 2 mg via ORAL
  Filled 2020-02-10: qty 1

## 2020-02-10 MED ORDER — DEXAMETHASONE SODIUM PHOSPHATE 10 MG/ML IJ SOLN
6.0000 mg | INTRAMUSCULAR | Status: DC
  Administered 2020-02-10 – 2020-02-13 (×4): 6 mg via INTRAVENOUS
  Filled 2020-02-10 (×4): qty 1

## 2020-02-10 MED ORDER — ONDANSETRON HCL 4 MG/2ML IJ SOLN: 4.0000 mg | Freq: Four times a day (QID) | INTRAMUSCULAR | Status: DC | PRN

## 2020-02-10 MED ORDER — ACETAMINOPHEN 325 MG PO TABS
650.0000 mg | ORAL_TABLET | Freq: Four times a day (QID) | ORAL | Status: DC | PRN
  Administered 2020-02-11: 650 mg via ORAL
  Filled 2020-02-10: qty 2

## 2020-02-10 MED ORDER — BUDESONIDE 0.5 MG/2ML IN SUSP
0.5000 mg | Freq: Two times a day (BID) | RESPIRATORY_TRACT | Status: DC
  Filled 2020-02-10: qty 2

## 2020-02-10 MED ORDER — POLYETHYLENE GLYCOL 3350 17 G PO PACK: 17.0000 g | PACK | Freq: Every day | ORAL | Status: DC | PRN

## 2020-02-10 MED ORDER — BISACODYL 5 MG PO TBEC: 5.0000 mg | DELAYED_RELEASE_TABLET | Freq: Every day | ORAL | Status: DC | PRN

## 2020-02-10 MED ORDER — BUSPIRONE HCL 10 MG PO TABS
10.0000 mg | ORAL_TABLET | Freq: Two times a day (BID) | ORAL | Status: DC
  Administered 2020-02-10 – 2020-02-14 (×9): 10 mg via ORAL
  Filled 2020-02-10 (×3): qty 2
  Filled 2020-02-10: qty 1
  Filled 2020-02-10 (×3): qty 2
  Filled 2020-02-10: qty 1
  Filled 2020-02-10 (×3): qty 2

## 2020-02-10 MED ORDER — ONDANSETRON HCL 4 MG PO TABS: 4.0000 mg | ORAL_TABLET | Freq: Four times a day (QID) | ORAL | Status: DC | PRN

## 2020-02-10 MED ORDER — SODIUM CHLORIDE 0.9 % IV SOLN: 250.0000 mL | INTRAVENOUS | Status: DC | PRN

## 2020-02-10 MED ORDER — AEROCHAMBER PLUS FLO-VU LARGE MISC
Status: AC
  Filled 2020-02-10: qty 1

## 2020-02-10 MED ORDER — KETOROLAC TROMETHAMINE 30 MG/ML IJ SOLN
15.0000 mg | Freq: Once | INTRAMUSCULAR | Status: AC
  Administered 2020-02-10: 15 mg via INTRAVENOUS
  Filled 2020-02-10: qty 1

## 2020-02-10 MED ORDER — LEVOTHYROXINE SODIUM 75 MCG PO TABS
175.0000 ug | ORAL_TABLET | Freq: Every day | ORAL | Status: DC
  Administered 2020-02-11 – 2020-02-14 (×4): 175 ug via ORAL
  Filled 2020-02-10 (×4): qty 1

## 2020-02-10 MED ORDER — METOPROLOL SUCCINATE ER 25 MG PO TB24
100.0000 mg | ORAL_TABLET | Freq: Every day | ORAL | Status: DC
  Administered 2020-02-10 – 2020-02-13 (×3): 100 mg via ORAL
  Filled 2020-02-10 (×4): qty 4

## 2020-02-10 MED ORDER — HYDROCOD POLST-CPM POLST ER 10-8 MG/5ML PO SUER: 5.0000 mL | Freq: Two times a day (BID) | ORAL | Status: DC | PRN

## 2020-02-10 MED ORDER — SODIUM CHLORIDE 0.9 % IV BOLUS
500.0000 mL | Freq: Once | INTRAVENOUS | Status: AC
  Administered 2020-02-10: 500 mL via INTRAVENOUS

## 2020-02-10 MED ORDER — IPRATROPIUM BROMIDE HFA 17 MCG/ACT IN AERS
2.0000 | INHALATION_SPRAY | RESPIRATORY_TRACT | Status: AC
  Administered 2020-02-10 (×3): 2 via RESPIRATORY_TRACT
  Filled 2020-02-10: qty 12.9

## 2020-02-10 MED ORDER — SODIUM CHLORIDE 0.9% FLUSH
3.0000 mL | Freq: Two times a day (BID) | INTRAVENOUS | Status: DC
  Administered 2020-02-10 – 2020-02-14 (×9): 3 mL via INTRAVENOUS

## 2020-02-10 MED ORDER — ONDANSETRON HCL 4 MG/2ML IJ SOLN
4.0000 mg | Freq: Once | INTRAMUSCULAR | Status: AC
  Administered 2020-02-10: 4 mg via INTRAVENOUS
  Filled 2020-02-10: qty 2

## 2020-02-10 MED ORDER — SODIUM CHLORIDE 0.9 % IV SOLN
200.0000 mg | Freq: Once | INTRAVENOUS | Status: AC
  Administered 2020-02-10: 200 mg via INTRAVENOUS
  Filled 2020-02-10: qty 40

## 2020-02-10 MED ORDER — ACETAMINOPHEN 325 MG PO TABS
650.0000 mg | ORAL_TABLET | Freq: Once | ORAL | Status: AC
  Administered 2020-02-10: 650 mg via ORAL
  Filled 2020-02-10: qty 2

## 2020-02-10 MED ORDER — FLEET ENEMA 7-19 GM/118ML RE ENEM: 1.0000 | ENEMA | Freq: Once | RECTAL | Status: DC | PRN

## 2020-02-10 MED ORDER — FAMOTIDINE 20 MG PO TABS
20.0000 mg | ORAL_TABLET | Freq: Two times a day (BID) | ORAL | Status: DC
  Administered 2020-02-10 – 2020-02-14 (×9): 20 mg via ORAL
  Filled 2020-02-10 (×9): qty 1

## 2020-02-10 MED ORDER — FLUTICASONE PROPIONATE HFA 220 MCG/ACT IN AERO
2.0000 | INHALATION_SPRAY | Freq: Two times a day (BID) | RESPIRATORY_TRACT | Status: DC
  Filled 2020-02-10: qty 12

## 2020-02-10 MED ORDER — SODIUM CHLORIDE 0.9% FLUSH: 3.0000 mL | INTRAVENOUS | Status: DC | PRN

## 2020-02-10 MED ORDER — ENOXAPARIN SODIUM 40 MG/0.4ML ~~LOC~~ SOLN
40.0000 mg | SUBCUTANEOUS | Status: DC
  Administered 2020-02-10 – 2020-02-13 (×4): 40 mg via SUBCUTANEOUS
  Filled 2020-02-10 (×4): qty 0.4

## 2020-02-10 MED ORDER — MONTELUKAST SODIUM 10 MG PO TABS
10.0000 mg | ORAL_TABLET | Freq: Every day | ORAL | Status: DC
  Administered 2020-02-10 – 2020-02-13 (×4): 10 mg via ORAL
  Filled 2020-02-10 (×4): qty 1

## 2020-02-10 NOTE — ED Notes (Signed)
Laying down prior to ambulation, pt O2sat was 94. Upon sitting up and ambulating, the pt's O2sat reached a high of 95 and a low of 90. Pt did one lap around his stretcher bed, taking small steps and saying that he had "no energy."

## 2020-02-10 NOTE — ED Notes (Signed)
Pt given sandwich to eat after stating that the saltine crackers tasted "too sweet."

## 2020-02-10 NOTE — ED Provider Notes (Signed)
[facility] EMERGENCY DEPARTMENT Provider Note   CSN: [id number] Arrival date & time: 02/09/20  2122     History Chief Complaint  Patient presents with  . COVID+ / Chills/ Body Aches/Diarrhea/Cough    Anthony Rowe is a 39 y.o. adult.  39 year old male with a history of hypertension, asthma, thyroid disease presents to the emergency department from urgent care with complaints of shortness of breath.  Was diagnosed with Covid on 01/31/2020.  States that he continues to feel short of breath and did not have any inhalers at home to use for management.  Also reporting some subjective fever, persistent body aches and chills.  Has been experiencing nausea and vomiting with difficulty tolerating food or fluids by mouth.  Had some watery, nonbloody diarrhea as well which has improved following the use of loperamide.  The history is provided by the patient. No language interpreter was used.       Past Medical History:  Diagnosis Date  . Asthma   . Bronchitis   . Endometriosis   . Hypertension   . Migraine   . Thyroid disease    hyperthyroidism per pt report    Patient Active Problem List   Diagnosis Date Noted  . Polycythemia 12/20/2019  . Hypothyroidism 11/06/2019  . Hypokalemia 05/29/2019  . Migraine 08/12/2018  . HTN (hypertension) 08/12/2018  . Asthma 08/12/2018  . Male-to-male transgender person 08/12/2018  . Gender identity disorder 08/19/2016  . Left ankle pain 09/25/2013  . Hypovitaminosis D 09/25/2013    Past Surgical History:  Procedure Laterality Date  . ANKLE SURGERY    . MASTECTOMY Bilateral 2017   male to male        Family History  Problem Relation Age of Onset  . Cancer Mother        lung  . Hypertension Maternal Grandmother   . Diabetes Maternal Grandmother   . Thyroid disease Neg Hx     Social History   Tobacco Use  . Smoking status: Never Smoker  . Smokeless tobacco: Never Used  Substance Use Topics  . Alcohol use:  No  . Drug use: No    Home Medications Prior to Admission medications   Medication Sig Start Date End Date Taking? Authorizing Provider  albuterol (VENTOLIN HFA) 108 (90 Base) MCG/ACT inhaler Inhale 2 puffs every 4-6 hours as needed for cough, wheeze, shortness of breath or chest tightness. 02/09/20   Marcelyn Bruins, MD  buPROPion (WELLBUTRIN XL) 150 MG 24 hr tablet Take 150 mg by mouth daily. 12/05/19   [provider]  busPIRone (BUSPAR) 10 MG tablet Take 10 mg by mouth 2 (two) times daily. 11/14/19   [provider]  cyclobenzaprine (FLEXERIL) 10 MG tablet Take by mouth. 01/25/13   [provider]  Elagolix Sodium 150 MG TABS Take 150 mg by mouth daily. 10/06/19   Romero Belling, MD  famotidine (PEPCID) 20 MG tablet Take 1 tablet (20 mg total) by mouth 2 (two) times daily. 07/26/19   Marcelyn Bruins, MD  fluticasone (FLONASE) 50 MCG/ACT nasal spray Place 1 spray into both nostrils daily. 07/14/19   Georgetta Haber, NP  fluticasone (FLOVENT HFA) 220 MCG/ACT inhaler Inhale 2 puffs into the lungs twice daily 02/09/20   Marcelyn Bruins, MD  gabapentin (NEURONTIN) 100 MG capsule SMARTSIG:2 Capsule(s) By Mouth Every Evening 01/10/20   [provider]  hydrochlorothiazide (HYDRODIURIL) 25 MG tablet hydrochlorothiazide 25 mg tablet  Take 1 tablet every day by oral route.  [provider]  hydrOXYzine (ATARAX/VISTARIL) 25 MG tablet Take 1-2 tablets (25-50 mg total) by mouth at bedtime as needed. 07/26/19   Marcelyn Bruins, MD  ibuprofen (ADVIL,MOTRIN) 800 MG tablet Take 800-1,600 mg by mouth every 8 (eight) hours as needed for headache, mild pain or moderate pain (migraine).     [provider]  levocetirizine (XYZAL) 5 MG tablet Take 1 tablet (5 mg total) by mouth 2 (two) times daily. 03/30/19   Marcelyn Bruins, MD  levothyroxine (SYNTHROID) 175 MCG tablet Take 1 tablet (175 mcg total) by mouth daily before  breakfast. 12/20/19   Romero Belling, MD  lisinopril (PRINIVIL,ZESTRIL) 20 MG tablet Take 1 tablet (20 mg total) by mouth daily. 08/06/15   Renne Crigler, PA-C  loperamide (IMODIUM) 2 MG capsule Take 1 capsule (2 mg total) by mouth daily as needed for diarrhea or loose stools. 07/18/19   Wallis Bamberg, PA-C  metoprolol succinate (TOPROL-XL) 100 MG 24 hr tablet TAKE 1 TABLET BY MOUTH DAILY. SEND FUTURE REFILLS TO PCP 11/06/19   Romero Belling, MD  montelukast (SINGULAIR) 10 MG tablet Take 1 tablet (10 mg total) by mouth at bedtime. 07/26/19   Marcelyn Bruins, MD  ondansetron (ZOFRAN-ODT) 8 MG disintegrating tablet Take 1 tablet (8 mg total) by mouth every 8 (eight) hours as needed for nausea or vomiting. 07/18/19   Wallis Bamberg, PA-C  rizatriptan (MAXALT) 10 MG tablet Take 10 mg by mouth as needed. 01/02/20   [provider]  testosterone cypionate (DEPOTESTOTERONE CYPIONATE) 100 MG/ML injection 40 mg every 10 days, and syringes 1 every 10 days 12/22/19   Romero Belling, MD  traZODone (DESYREL) 100 MG tablet Take 100 mg by mouth at bedtime as needed for sleep.     [provider]  triamcinolone cream (KENALOG) 0.1 % Apply 1 application topically 4 (four) times daily. As needed for itching 07/26/19   Romero Belling, MD    Allergies    Frovatriptan and Sulfa antibiotics  Review of Systems   Review of Systems  Ten systems reviewed and are negative for acute change, except as noted in the HPI.    Physical Exam Updated Vital Signs BP 101/65   Pulse 83   Temp 98.6 F (37 C) (Oral)   Resp (!) 28   Ht 5\' 3"  (1.6 m)   Wt 85 kg   SpO2 94%   BMI 33.19 kg/m   Physical Exam Vitals and nursing note reviewed.  Constitutional:      General: He is not in acute distress.    Appearance: He is well-developed. He is not diaphoretic.     Comments: Nontoxic appearing and in NAD  HENT:     Head: Normocephalic and atraumatic.  Eyes:     General: No scleral icterus.    Conjunctiva/sclera:  Conjunctivae normal.  Cardiovascular:     Rate and Rhythm: Normal rate and regular rhythm.     Pulses: Normal pulses.  Pulmonary:     Effort: Pulmonary effort is normal. No respiratory distress.     Comments: Taken off supplemental O2 on arrival to room. Sats remained at or above 96% on RA. No respiratory distress noted. Speaking in full sentences. Musculoskeletal:        General: Normal range of motion.     Cervical back: Normal range of motion.  Skin:    General: Skin is warm and dry.     Coloration: Skin is not pale.     Findings: No erythema or  rash.  Neurological:     Mental Status: He is alert and oriented to person, place, and time.     Coordination: Coordination normal.  Psychiatric:        Behavior: Behavior normal.     ED Results / Procedures / Treatments   Labs (all labs ordered are listed, but only abnormal results are displayed) Labs Reviewed  COMPREHENSIVE METABOLIC PANEL - Abnormal; Notable for the following components:      Result Value   Potassium 3.4 (*)    Creatinine, Ser 1.59 (*)    Calcium 8.7 (*)    AST 54 (*)    Total Bilirubin 1.4 (*)    GFR calc non Af Amer 54 (*)    All other components within normal limits  CBC WITH DIFFERENTIAL/PLATELET  LACTIC ACID, PLASMA  LACTIC ACID, PLASMA  PROCALCITONIN  LACTATE DEHYDROGENASE  FERRITIN  TRIGLYCERIDES  FIBRINOGEN  C-REACTIVE PROTEIN  D-DIMER, QUANTITATIVE (NOT AT [facility])  I-STAT BETA HCG BLOOD, ED ([facility], [facility], [facility] ONLY)    SARS-COV-2 RNA, QL, RT PCR (COVID-19)Resulted: 01/31/2020 3:20 PM [facility] Component Name Value Ref Range  SARS-CoV-2, NAA Positive (A)  Comment: Positive: SARS-CoV-2 detected Testing identified the presence of SARS-CoV-2 (the virus that causes COVID-19) in the patient's sample.  This test was developed for the detection of nucleic acids from the SARS-CoV-2 virus by RT-PCR in individuals who meet SARS-CoV-2 clinical and/or epidemiological criteria. This test has not  been FDA cleared or approved. This test has been authorized by FDA under an EUA for use by the authorized laboratory. This test is only authorized for the duration of time that the Secretary of the HHS declares circumstances exist justifying the authorization of the emergency use of  in vitro diagnostic tests for detection of SARS-CoV-2 virus and/or diagnosis of COVID-19 infection under section 564(b)(1) of the Act, 21 U.S.C. 546EVO-3(J)(0), unless the authorization is terminated or revoked sooner. Specimens that are self-collected  were not tested with an internal control to confirm that the specimen was properly collected. As such, unsupervised self-collected specimens from SARS-CoV-2 positive individuals may yield negative results if the specimen was not collected properly. To learn more about this test, go to https://www.helix.com/pages/[email or web address]-efforts Performed byMorene Rankins, CAP [id number], CLIA [id number], [phone] [place] [address] Laboratory Director: Maurene Capes, PhD, [facility], [facility] ([facility]) Negative     EKG None  Radiology DG Chest Portable 1 View  Result Date: 02/09/2020 CLINICAL DATA:  39 year old male with shortness of breath and cough. Positive COVID-19. EXAM: PORTABLE CHEST 1 VIEW COMPARISON:  Chest radiograph dated 02/15/2016. FINDINGS: Bilateral mid to lower lung field confluent and streaky densities most consistent with multifocal pneumonia and in keeping with known COVID-19. There is no pleural effusion or pneumothorax. The cardiac silhouette is within normal limits. No acute osseous pathology. IMPRESSION: Multifocal pneumonia. Electronically Signed   By: Elgie Collard M.D.   On: 02/09/2020 23:35    Procedures Procedures (including critical care time)  Medications Ordered in ED Medications  AeroChamber Plus Flo-Vu Large MISC (has no administration in time range)  sodium chloride 0.9 % bolus 500 mL (500 mLs Intravenous New Bag/Given 02/10/20 0435)   AeroChamber Plus Flo-Vu Large MISC 1 each (1 each Other Given 02/10/20 0435)  albuterol (VENTOLIN HFA) 108 (90 Base) MCG/ACT inhaler 6 puff (6 puffs Inhalation Given 02/10/20 0515)  ipratropium (ATROVENT HFA) inhaler 2 puff (2 puffs Inhalation Given 02/10/20 0515)  ondansetron (ZOFRAN) injection 4 mg (4 mg  Intravenous Given 02/10/20 0435)  acetaminophen (TYLENOL) tablet 650 mg (650 mg Oral Given 02/10/20 0175)  ketorolac (TORADOL) 30 MG/ML injection 15 mg (15 mg Intravenous Given 02/10/20 0608)    ED Course  I have reviewed the triage vital signs and the nursing notes.  Pertinent labs & imaging results that were available during my care of the patient were reviewed by me and considered in my medical decision making (see chart for details).  Clinical Course as of Feb 10 704  Sat Feb 10, 2020  0649 Patient remains tachypneic despite use of inhalers.  With ambulation, is dyspneic with minimal exertion.  Sats dropped to 90% on room air.  Oxygen saturations remain 93 to 95% on room air when at rest.  Patient reports that he does not feel well enough for discharge.  Will consult with inpatient team regarding observation.   [KH]    Clinical Course User Index [KH] Antonietta Breach, PA-C   MDM Rules/Calculators/A&P                      Patient presenting for worsening shortness of breath.  Was diagnosed with Covid on 01/31/2020.  X-ray today shows multifocal pneumonia consistent with COVID-19.  He has no leukocytosis.  Oxygen saturations have been fairly stable over ED course, though patient is increasingly symptomatic with dyspnea on exertion.  Saturations dropped to 90% on room air with ambulation.  Will remain in the mid 90s when at rest, but with persistent tachypnea.  No significant improvement following 3 rounds of albuterol/Atrovent inhalers.  Patient does not feel comfortable with plan for discharge.  Will discuss observation admission with unassigned IM.  Called for admission to be fielded by Alyse Low, PA-C at change of shift.  Anthony Rowe was evaluated in Emergency Department on 02/10/2020 for the symptoms described in the history of present illness. He was evaluated in the context of the global COVID-19 pandemic, which necessitated consideration that the patient might be at risk for infection with the SARS-CoV-2 virus that causes COVID-19. Institutional protocols and algorithms that pertain to the evaluation of patients at risk for COVID-19 are in a state of rapid change based on information released by regulatory bodies including the CDC and federal and state organizations. These policies and algorithms were followed during the patient's care in the ED.   Final Clinical Impression(s) / ED Diagnoses Final diagnoses:  COVID-19 virus infection  Shortness of breath  AKI (acute kidney injury) [facility])    Rx / DC Orders ED Discharge Orders    None       Antonietta Breach, PA-C 02/11/20 0604    Ward, Delice Bison, DO 02/14/20 [phone]

## 2020-02-10 NOTE — H&P (Signed)
History and Physical    [name]:[id number] DOB: May 02, 1981 DOA: 02/09/2020  PCP: Fleet Contras, MD Consultants:  Everardo All - endocrinology Patient coming from: Home - lives with wife, twins, and baby; NOK: Wife, Anthony Rowe, [phone]  Chief Complaint: SOB, worsening COVID-19  HPI: Anthony Rowe is a 39 y.o. adult with medical history significant of HTN; transgender; and hyperthyroidism presenting with SOB, worsening COVID-19 symptoms.  He went to [place] from 3/26-28.  Shortly after arrival, he noticed abdominal discomfort and diarrhea.  He has had weakness, fatigue, and myalgias.  [facility] after arriving home and tested positive for COVID.  His symptoms have been persistent and he has been unable to eat or drink.  He has been feeling SOB with ambulation and just isn't getting better.   ED Course: SOB, hypoxia with ambulation.  Diagnosed on 3/29 with COVID.  Review of Systems: As per HPI; otherwise review of systems reviewed and negative.   Ambulatory Status:  Ambulates without assistance  COVID Vaccine Status:  None  Past Medical History:  Diagnosis Date  . Asthma   . Bronchitis   . Endometriosis   . Hypertension   . Migraine   . Thyroid disease    hyperthyroidism per pt report    Past Surgical History:  Procedure Laterality Date  . ANKLE SURGERY    . MASTECTOMY Bilateral 2017   male to male     Social History   Socioeconomic History  . Marital status: Married    Spouse name: Not on file  . Number of children: Not on file  . Years of education: Not on file  . Highest education level: Not on file  Occupational History  . Not on file  Tobacco Use  . Smoking status: Never Smoker  . Smokeless tobacco: Never Used  Substance and Sexual Activity  . Alcohol use: No  . Drug use: No  . Sexual activity: Not Currently  Other Topics Concern  . Not on file  Social History Narrative  . Not on file   Social Determinants of Health    Financial Resource Strain:   . Difficulty of Paying Living Expenses:   Food Insecurity:   . Worried About Programme researcher, broadcasting/film/video in the Last Year:   . Barista in the Last Year:   Transportation Needs:   . Freight forwarder (Medical):   Marland Kitchen Lack of Transportation (Non-Medical):   Physical Activity:   . Days of Exercise per Week:   . Minutes of Exercise per Session:   Stress:   . Feeling of Stress :   Social Connections:   . Frequency of Communication with Friends and Family:   . Frequency of Social Gatherings with Friends and Family:   . Attends Religious Services:   . Active Member of Clubs or Organizations:   . Attends Banker Meetings:   Marland Kitchen Marital Status:   Intimate Partner Violence:   . Fear of Current or Ex-Partner:   . Emotionally Abused:   Marland Kitchen Physically Abused:   . Sexually Abused:     Allergies  Allergen Reactions  . Frovatriptan Other (See Comments)    Numbness in both legs  . Sulfa Antibiotics Other (See Comments)    Makes patients legs numb     Family History  Problem Relation Age of Onset  . Cancer Mother        lung  . Hypertension Maternal Grandmother   . Diabetes Maternal Grandmother   .  Thyroid disease Neg Hx     Prior to Admission medications   Medication Sig Start Date End Date Taking? Authorizing Provider  albuterol (VENTOLIN HFA) 108 (90 Base) MCG/ACT inhaler Inhale 2 puffs every 4-6 hours as needed for cough, wheeze, shortness of breath or chest tightness. 02/09/20   Marcelyn Bruins, MD  buPROPion (WELLBUTRIN XL) 150 MG 24 hr tablet Take 150 mg by mouth daily. 12/05/19   [provider]  busPIRone (BUSPAR) 10 MG tablet Take 10 mg by mouth 2 (two) times daily. 11/14/19   [provider]  cyclobenzaprine (FLEXERIL) 10 MG tablet Take by mouth. 01/25/13   [provider]  Elagolix Sodium 150 MG TABS Take 150 mg by mouth daily. 10/06/19   Romero Belling, MD  famotidine (PEPCID) 20 MG tablet Take 1  tablet (20 mg total) by mouth 2 (two) times daily. 07/26/19   Marcelyn Bruins, MD  fluticasone (FLONASE) 50 MCG/ACT nasal spray Place 1 spray into both nostrils daily. 07/14/19   Georgetta Haber, NP  fluticasone (FLOVENT HFA) 220 MCG/ACT inhaler Inhale 2 puffs into the lungs twice daily 02/09/20   Marcelyn Bruins, MD  gabapentin (NEURONTIN) 100 MG capsule SMARTSIG:2 Capsule(s) By Mouth Every Evening 01/10/20   [provider]  hydrochlorothiazide (HYDRODIURIL) 25 MG tablet hydrochlorothiazide 25 mg tablet  Take 1 tablet every day by oral route.    [provider]  hydrOXYzine (ATARAX/VISTARIL) 25 MG tablet Take 1-2 tablets (25-50 mg total) by mouth at bedtime as needed. 07/26/19   Marcelyn Bruins, MD  ibuprofen (ADVIL,MOTRIN) 800 MG tablet Take 800-1,600 mg by mouth every 8 (eight) hours as needed for headache, mild pain or moderate pain (migraine).     [provider]  levocetirizine (XYZAL) 5 MG tablet Take 1 tablet (5 mg total) by mouth 2 (two) times daily. 03/30/19   Marcelyn Bruins, MD  levothyroxine (SYNTHROID) 175 MCG tablet Take 1 tablet (175 mcg total) by mouth daily before breakfast. 12/20/19   Romero Belling, MD  lisinopril (PRINIVIL,ZESTRIL) 20 MG tablet Take 1 tablet (20 mg total) by mouth daily. 08/06/15   Renne Crigler, PA-C  loperamide (IMODIUM) 2 MG capsule Take 1 capsule (2 mg total) by mouth daily as needed for diarrhea or loose stools. 07/18/19   Wallis Bamberg, PA-C  metoprolol succinate (TOPROL-XL) 100 MG 24 hr tablet TAKE 1 TABLET BY MOUTH DAILY. SEND FUTURE REFILLS TO PCP 11/06/19   Romero Belling, MD  montelukast (SINGULAIR) 10 MG tablet Take 1 tablet (10 mg total) by mouth at bedtime. 07/26/19   Marcelyn Bruins, MD  ondansetron (ZOFRAN-ODT) 8 MG disintegrating tablet Take 1 tablet (8 mg total) by mouth every 8 (eight) hours as needed for nausea or vomiting. 07/18/19   Wallis Bamberg, PA-C  rizatriptan (MAXALT) 10 MG  tablet Take 10 mg by mouth as needed. 01/02/20   [provider]  testosterone cypionate (DEPOTESTOTERONE CYPIONATE) 100 MG/ML injection 40 mg every 10 days, and syringes 1 every 10 days 12/22/19   Romero Belling, MD  traZODone (DESYREL) 100 MG tablet Take 100 mg by mouth at bedtime as needed for sleep.     [provider]  triamcinolone cream (KENALOG) 0.1 % Apply 1 application topically 4 (four) times daily. As needed for itching 07/26/19   Romero Belling, MD    Physical Exam: Vitals:   02/10/20 0930 02/10/20 1115 02/10/20 1130 02/10/20 1145  BP: 97/70 119/74 90/61 109/70  Pulse: 75 85 77 89  Resp: Marland Kitchen)  26 (!) 22 (!) 23 (!) 24  Temp:      TempSrc:      SpO2: 96% 97% 95% 99%  Weight:      Height:         . General:  Appears calm and comfortable and is NAD . Eyes:  PERRL, EOMI, normal lids, iris . ENT:  grossly normal hearing, lips & tongue, mmm; facial peircings . Neck:  no LAD, masses or thyromegaly . Cardiovascular:  RRR, no m/r/g. No LE edema.  Marland Kitchen Respiratory:    Scattered rhonchi.  Normal to mildly increased respiratory effort. . Abdomen:  soft, NT, ND, NABS . Back:   normal alignment, no CVAT . Skin:  no rash or induration seen on limited exam . Musculoskeletal:  grossly normal tone BUE/BLE, good ROM, no bony abnormality . Psychiatric:  grossly normal mood and affect, speech fluent and appropriate, AOx3 . Neurologic:  CN 2-12 grossly intact, moves all extremities in coordinated fashion    Radiological Exams on Admission: DG Chest Portable 1 View  Result Date: 02/09/2020 CLINICAL DATA:  40 year old male with shortness of breath and cough. Positive COVID-19. EXAM: PORTABLE CHEST 1 VIEW COMPARISON:  Chest radiograph dated 02/15/2016. FINDINGS: Bilateral mid to lower lung field confluent and streaky densities most consistent with multifocal pneumonia and in keeping with known COVID-19. There is no pleural effusion or pneumothorax. The cardiac silhouette is within  normal limits. No acute osseous pathology. IMPRESSION: Multifocal pneumonia. Electronically Signed   By: Elgie Collard M.D.   On: 02/09/2020 23:35    EKG: not done   Labs on Admission: I have personally reviewed the available labs and imaging studies at the time of the admission.  Pertinent labs:   K+ 3.4 BUN 20/Creatinine 1.59/GFR >60; 8/0.64/169 in 05/2019 Normal CBC TSH 47.32/free T4 0.12 on 2/17 LDH 478 TG 201 Ferritin 509 CRP 4.1 Lactate 1.1, 1.0 Procalcitonin 0.11 D-dimer 0.75 Fibrinogen 755   Assessment/Plan Principal Problem:   Acute respiratory disease due to COVID-19 virus Active Problems:   HTN (hypertension)   Asthma   Male-to-male transgender person   Hypothyroidism   Depression with anxiety   Obesity (BMI 30.0-34.9)   Acute respiratory failure with hypoxia due to COVID-19-associated multifocal PNA -Patient with presenting with SOB and fatigue, myalgias -Also with persistent diarrhea and anorexia -He does not have a resting O2 requirement but is hypoxic with ambulation -COVID POSITIVE -The patient has comorbidities which may increase the risk for ARDS/MODS including: HTN, obesity, asthma -Pertinent labs concerning for COVID include normal WBC count; increased BUN/Creatinine; increased LDH; elevated D-dimer (not >1); increased ferritin; low procalcitonin; elevated CRP (not >7); increased fibrinogen -CXR with multifocal opacities which may be c/w COVID vs. Multifocal PNA -Will not treat with broad-spectrum antibiotics given procalcitonin <0.1 -Will admit for further evaluation, close monitoring, and treatment -At this time, will attempt to avoid use of aerosolized medications and use HFAs instead -Will check daily labs including BMP with Mag, Phos; LFTs; CBC with differential; CRP; ferritin; fibrinogen; D-dimer -Will order steroids and Remdesivir (pharmacy consult) given +COVID test, +CXR -If the patient shows clinical deterioration, consider transfer to  ICU with PCCM consultation -Consider Tocilizumab and/or convalescent plasma if the patient does not stabilize on current treatment or if the patient has marked clinical decompensation; the patient does not appear to require these treatments at this time. -Will attempt to maintain euvolemia to a net negative fluid status -Will ask the patient to maintain an awake prone position for 16+ hours  a day, if possible, with a minimum of 2-3 hours at a time -With D-dimer <5, will use standard-dosed Lovenox for DVT prevention -Patient was seen wearing full PPE including: gown, gloves, head cover, N95, and face shield; donning and doffing was in compliance with current standards.  Hyperthyroidism -> Hypothyroidism -s/p RAI -Synthroid increased in Feb -Recheck TSH, free T4 to determine if additional dose adjustment is needed -Continue Synthroid at current dose for now  HTN -Hold both HCTZ and Lisinopril for now due to mildly elevated creatinine -Continue Toprol XL  Asthma -Continue home meds - Flonase, Flovent, Xyzal, Singulair  Transgender -He is still in transition -Prefers to be called Anthony Rowe  -Continue testosterone and Elagolix  Depression/anxiety -Continue Wellbutrin, BuSpar, hydroxyzine, trazodone  Obesity -Body mass index is 33.19 kg/m. -Weight loss should be encouraged     DVT prophylaxis:  Lovenox  Code Status:  Full - confirmed with patient Family Communication: None present; I spoke with the patient's wife by telephone. Disposition Plan:  He is anticipated to d/c to home without [facility] services once his respiratory issues have been resolved.  He may require home O2 at the time of discharge. Consults called: None  Admission status: Admit - It is my clinical opinion that admission to INPATIENT is reasonable and necessary because of the expectation that this patient will require hospital care that crosses at least 2 midnights to treat this condition based on the medical complexity  of the problems presented.  Given the aforementioned information, the predictability of an adverse outcome is felt to be significant.      Karmen Bongo MD [facility]   How to contact the [facility] Attending or Consulting provider [place] or covering provider during after hours [place], for this patient?  1. Check the care team in [facility] and look for a) attending/consulting [facility] provider listed and b) the [facility] team listed 2. Log into [email or web address] and use [place]'s universal password to access. If you do not have the password, please contact the hospital operator. 3. Locate the [facility] provider you are looking for under [facility] and page to a number that you can be directly reached. 4. If you still have difficulty reaching the provider, please page the [facility] (Director on Call) for the Hospitalists listed on amion for assistance.   02/10/2020, 1:05 PM

## 2020-02-10 NOTE — ED Provider Notes (Signed)
Pt's care asummed at 7am.  [facility] consult pending.  I spoke to Dr. Ophelia Charter who will see pt for admission    Anthony Rowe 02/10/20 0900    Rancour, Jeannett Senior, MD 02/10/20 [phone]

## 2020-02-10 NOTE — ED Notes (Signed)
Pt eating saltine crackers and drinking a cup of water.

## 2020-02-10 NOTE — Progress Notes (Signed)
Anthony Rowe [id number] Admission Data: 02/10/2020 3:28 PM Attending Provider: Jonah Blue, MD  GXQ:JJHERDE, Dorma Russell, MD Consults/ Treatment Team:   Anthony Rowe is a 39 y.o. adult patient admitted from ED awake, alert  & orientated  X 3,  Full Code, VSS - Blood pressure 100/69, pulse 89, temperature 99.3 F (37.4 C), temperature source Oral, resp. rate (!) 21, height 5\' 3"  (1.6 m), weight 85 kg, SpO2 93 %., O2  room air, , no c/o shortness of breath, no c/o chest pain, no distress noted. T IV site WDL:  antecubital right, condition patent and no redness with a transparent dsg that's clean dry and intact.  Allergies:   Allergies  Allergen Reactions  . Frovatriptan Other (See Comments)    Numbness in both legs  . Sulfa Antibiotics Other (See Comments)    Makes patients legs numb      Past Medical History:  Diagnosis Date  . Asthma   . Bronchitis   . Endometriosis   . Hypertension   . Migraine   . Thyroid disease    hyperthyroidism per pt report      Pt orientation to unit, room and routine. Information packet given to patient/family and safety video watched.  Admission INP armband ID verified with patient/family, and in place. SR up x 2, fall risk assessment complete with Patient and family verbalizing understanding of risks associated with falls. Pt verbalizes an understanding of how to use the call bell and to call for help before getting out of bed.  Skin, clean-dry- intact without evidence of bruising, or skin tears.   No evidence of skin break down noted on exam.    Will cont to monitor and assist as needed.  , RN 02/10/2020 3:28 PM

## 2020-02-10 NOTE — Progress Notes (Signed)
Patient received first does of remdesivir, without any complications, on room air. Nurse waiting on pharmacy to verified home medication.

## 2020-02-10 NOTE — ED Notes (Signed)
Ambulation was performed with pt on 3L nasal cannula. Laying down, pt O2sat was 96. Upon sitting up, pt stated their increased SOB and was slow to stand and begin ambulation. While ambulating, O2sat rose and remained between 98 and 100. However, pt only walked a few steps before halting and reporting dizziness. This NT then instructed pt to sit and lay back down in the stretcher bed. Laying back down, O2sat remained in the low to mid 90s.

## 2020-02-11 DIAGNOSIS — U071 COVID-19: Secondary | ICD-10-CM | POA: Diagnosis not present

## 2020-02-11 DIAGNOSIS — J069 Acute upper respiratory infection, unspecified: Secondary | ICD-10-CM | POA: Diagnosis not present

## 2020-02-11 DIAGNOSIS — I10 Essential (primary) hypertension: Secondary | ICD-10-CM | POA: Diagnosis not present

## 2020-02-11 LAB — PHOSPHORUS: Phosphorus: 2.5 mg/dL (ref 2.5–4.6)

## 2020-02-11 LAB — CBC WITH DIFFERENTIAL/PLATELET
Abs Immature Granulocytes: 0.01 10*3/uL (ref 0.00–0.07)
Basophils Absolute: 0 10*3/uL (ref 0.0–0.1)
Basophils Relative: 1 %
Eosinophils Absolute: 0 10*3/uL (ref 0.0–0.5)
Eosinophils Relative: 0 %
HCT: 44.6 % (ref 39.0–52.0)
Hemoglobin: 15.1 g/dL (ref 13.0–17.0)
Immature Granulocytes: 0 %
Lymphocytes Relative: 19 %
Lymphs Abs: 0.4 10*3/uL — ABNORMAL LOW (ref 0.7–4.0)
MCH: 31.5 pg (ref 26.0–34.0)
MCHC: 33.9 g/dL (ref 30.0–36.0)
MCV: 92.9 fL (ref 80.0–100.0)
Monocytes Absolute: 0.1 10*3/uL (ref 0.1–1.0)
Monocytes Relative: 3 %
Neutro Abs: 1.8 10*3/uL (ref 1.7–7.7)
Neutrophils Relative %: 77 %
Platelets: 366 10*3/uL (ref 150–400)
RBC: 4.8 MIL/uL (ref 4.22–5.81)
RDW: 12.7 % (ref 11.5–15.5)
WBC: 2.3 10*3/uL — ABNORMAL LOW (ref 4.0–10.5)
nRBC: 0 % (ref 0.0–0.2)

## 2020-02-11 LAB — COMPREHENSIVE METABOLIC PANEL
ALT: 45 U/L — ABNORMAL HIGH (ref 0–44)
AST: 56 U/L — ABNORMAL HIGH (ref 15–41)
Albumin: 3.3 g/dL — ABNORMAL LOW (ref 3.5–5.0)
Alkaline Phosphatase: 46 U/L (ref 38–126)
Anion gap: 12 (ref 5–15)
BUN: 19 mg/dL (ref 6–20)
CO2: 21 mmol/L — ABNORMAL LOW (ref 22–32)
Calcium: 8.5 mg/dL — ABNORMAL LOW (ref 8.9–10.3)
Chloride: 103 mmol/L (ref 98–111)
Creatinine, Ser: 1.17 mg/dL (ref 0.61–1.24)
GFR calc Af Amer: >60 mL/min (ref >60)
GFR calc non Af Amer: >60 mL/min (ref >60)
Glucose, Bld: 123 mg/dL — ABNORMAL HIGH (ref 70–99)
Potassium: 4.1 mmol/L (ref 3.5–5.1)
Sodium: 136 mmol/L (ref 135–145)
Total Bilirubin: 0.9 mg/dL (ref 0.3–1.2)
Total Protein: 7.1 g/dL (ref 6.5–8.1)

## 2020-02-11 LAB — D-DIMER, QUANTITATIVE: D-Dimer, Quant: 0.65 ug/mL-FEU — ABNORMAL HIGH (ref 0.00–0.50)

## 2020-02-11 LAB — C-REACTIVE PROTEIN: CRP: 4.5 mg/dL — ABNORMAL HIGH (ref <1.0)

## 2020-02-11 LAB — MAGNESIUM: Magnesium: 2.6 mg/dL — ABNORMAL HIGH (ref 1.7–2.4)

## 2020-02-11 LAB — FERRITIN: Ferritin: 602 ng/mL — ABNORMAL HIGH (ref 24–336)

## 2020-02-11 NOTE — Progress Notes (Signed)
PROGRESS NOTE                                                                                                                                                                                                             Patient Demographics:    Anthony Rowe, is a 39 y.o. adult, DOB - 12-20-1980, NWG:[id number]  Admit date - 02/09/2020   Admitting Physician Jonah Blue, MD  Outpatient Primary MD for the patient is Fleet Contras, MD  LOS - 1   Chief Complaint  Patient presents with  . COVID+ / Chills/ Body Aches/Diarrhea/Cough       Brief Narrative     Anthony Rowe is a 39 y.o. adult with medical history significant of HTN; transgender; and hyperthyroidism presenting with SOB, worsening COVID-19 symptoms.  He went to [place] from 3/26-28.  Shortly after arrival, he noticed abdominal discomfort and diarrhea.  He has had weakness, fatigue, and myalgias.  [facility] after arriving [facility] and tested positive for COVID.  His symptoms have been persistent and he has been unable to eat or drink.  He has been feeling SOB with ambulation and just isn't getting better.    Subjective:    Anthony Rowe today reports some cough, dyspnea on exertion, denies any fever or chills, no chest pain .   Assessment  & Plan :    Principal Problem:   Acute respiratory disease due to COVID-19 virus Active Problems:   HTN (hypertension)   Asthma   Male-to-male transgender person   Hypothyroidism   Depression with anxiety   Obesity (BMI 30.0-34.9)  Acute hypoxic respiratory failure due to COVID-19 pneumonia. -No hypoxia at rest, but with activity. -Chest x-ray significant for bilateral multifocal opacity. -Continue with IV steroids. -Continue with IV remdesivir. -Encouraged use incentive spirometry and flutter valve, out of bed to chair. -Trend inflammatory markers.    COVID-19 Labs  Recent Labs    02/10/20 0801 02/11/20 0253    DDIMER 0.75* 0.65*  FERRITIN 509* 602*  LDH 478*  --   CRP 4.1* 4.5*   Hyperthyroidism -> Hypothyroidism -s/p RAI -Synthroid increased in Feb -Continue Synthroid at current dose for now  HTN -Hold both HCTZ and Lisinopril for now due to mildly elevated creatinine -Continue Toprol XL  Asthma -Continue home meds - Flonase, Flovent, Xyzal, Singulair -  no wheezing.  Transgender -He is still in transition -Prefers to be called Abron  -Continue testosterone and Elagolix  Depression/anxiety -Continue Wellbutrin, BuSpar, hydroxyzine, trazodone  Obesity -Body mass index is 33.19 kg/m. -Weight loss should be encouraged    Code Status : Full  Disposition Plan  : home  Barriers For Discharge : remains on IV steroids and remdesivir  Consults  : None  Procedures  : None  DVT Prophylaxis  :  [place] lovenox  Lab Results  Component Value Date   PLT 366 02/11/2020    Antibiotics  :   Anti-infectives (From admission, onward)   Start     Dose/Rate Route Frequency Ordered Stop   02/11/20 1000  remdesivir 100 mg in sodium chloride 0.9 % 100 mL IVPB     100 mg 200 mL/hr over 30 Minutes Intravenous Daily 02/10/20 1255 02/15/20 0959   02/10/20 1300  remdesivir 200 mg in sodium chloride 0.9% 250 mL IVPB     200 mg 580 mL/hr over 30 Minutes Intravenous Once 02/10/20 1255 02/10/20 1900        Objective:   Vitals:   02/11/20 0259 02/11/20 0520 02/11/20 0818 02/11/20 0935  BP:  98/82 122/82 99/67  Pulse: (!) 53 68 76 72  Resp:  18 20   Temp:  (!) 97.4 F (36.3 C) 97.7 F (36.5 C)   TempSrc:  Oral Oral   SpO2: 98% 96% 91%   Weight:      Height:        Wt Readings from Last 3 Encounters:  02/09/20 85 kg  12/20/19 84.2 kg  11/06/19 84.4 kg     Intake/Output Summary (Last 24 hours) at 02/11/2020 1525 Last data filed at 02/11/2020 0000 Gross per 24 hour  Intake 0 ml  Output --  Net 0 ml     Physical Exam  Awake Alert, Oriented X 3, No new F.N  deficits, Normal affect Symmetrical Chest wall movement, Good air movement bilaterally, CTAB RRR,No Gallops,Rubs or new Murmurs, No Parasternal Heave +ve B.Sounds, Abd Soft, No tenderness, No rebound - guarding or rigidity. No Cyanosis, Clubbing or edema, No new Rash or bruise     Data Review:    CBC Recent Labs  Lab 02/09/20 2204 02/11/20 0253  WBC 4.9 2.3*  HGB 15.9 15.1  HCT 46.0 44.6  PLT 305 366  MCV 89.7 92.9  MCH 31.0 31.5  MCHC 34.6 33.9  RDW 12.4 12.7  LYMPHSABS 0.9 0.4*  MONOABS 0.2 0.1  EOSABS 0.0 0.0  BASOSABS 0.0 0.0    Chemistries  Recent Labs  Lab 02/09/20 2204 02/11/20 0253  NA 136 136  K 3.4* 4.1  CL 98 103  CO2 23 21*  GLUCOSE 99 123*  BUN 20 19  CREATININE 1.59* 1.17  CALCIUM 8.7* 8.5*  MG  --  2.6*  AST 54* 56*  ALT 36 45*  ALKPHOS 48 46  BILITOT 1.4* 0.9   ------------------------------------------------------------------------------------------------------------------ Recent Labs    02/10/20 0801  TRIG 201*    No results found for: HGBA1C ------------------------------------------------------------------------------------------------------------------ Recent Labs    02/10/20 1315  TSH 24.877*   ------------------------------------------------------------------------------------------------------------------ Recent Labs    02/10/20 0801 02/11/20 0253  FERRITIN 509* 602*    Coagulation profile No results for input(s): INR, PROTIME in the last 168 hours.  Recent Labs    02/10/20 0801 02/11/20 0253  DDIMER 0.75* 0.65*    Cardiac Enzymes No results for input(s): CKMB, TROPONINI, MYOGLOBIN in the last 168 hours.  Invalid input(s): CK ------------------------------------------------------------------------------------------------------------------ No results found for: BNP  Inpatient Medications  Scheduled Meds: . buPROPion  150 mg Oral Daily  . busPIRone  10 mg Oral BID  . dexamethasone (DECADRON) injection  6  mg Intravenous Q24H  . enoxaparin (LOVENOX) injection  40 mg Subcutaneous Q24H  . famotidine  20 mg Oral BID  . fluticasone  1 spray Each Nare Daily  . gabapentin  200 mg Oral QHS  . levothyroxine  175 mcg Oral QAC breakfast  . loratadine  5 mg Oral BID  . metoprolol succinate  100 mg Oral Daily  . montelukast  10 mg Oral QHS  . sodium chloride flush  3 mL Intravenous Q12H   Continuous Infusions: . sodium chloride    . remdesivir 100 mg in NS 100 mL 100 mg (02/11/20 1043)   PRN Meds:.sodium chloride, acetaminophen, albuterol, bisacodyl, chlorpheniramine-HYDROcodone, guaiFENesin-dextromethorphan, hydrOXYzine, loperamide, ondansetron **OR** ondansetron (ZOFRAN) IV, oxyCODONE, polyethylene glycol, sodium chloride flush, sodium phosphate, traZODone  Micro Results No results found for this or any previous visit (from the past 240 hour(s)).  Radiology Reports DG Chest Portable 1 View  Result Date: 02/09/2020 CLINICAL DATA:  39 year old male with shortness of breath and cough. Positive COVID-19. EXAM: PORTABLE CHEST 1 VIEW COMPARISON:  Chest radiograph dated 02/15/2016. FINDINGS: Bilateral mid to lower lung field confluent and streaky densities most consistent with multifocal pneumonia and in keeping with known COVID-19. There is no pleural effusion or pneumothorax. The cardiac silhouette is within normal limits. No acute osseous pathology. IMPRESSION: Multifocal pneumonia. Electronically Signed   By: Anner Crete M.D.   On: 02/09/2020 23:35      Phillips Climes M.D on 02/11/2020 at 3:25 PM  Between 7am to 7pm - Pager - [phone]  After 7pm go to [email or web address] - password [facility]  [facility] -  Office  [phone]

## 2020-02-12 DIAGNOSIS — I10 Essential (primary) hypertension: Secondary | ICD-10-CM | POA: Diagnosis not present

## 2020-02-12 DIAGNOSIS — J069 Acute upper respiratory infection, unspecified: Secondary | ICD-10-CM | POA: Diagnosis not present

## 2020-02-12 DIAGNOSIS — U071 COVID-19: Secondary | ICD-10-CM | POA: Diagnosis not present

## 2020-02-12 LAB — COMPREHENSIVE METABOLIC PANEL
ALT: 35 U/L (ref 0–44)
AST: 24 U/L (ref 15–41)
Albumin: 3.1 g/dL — ABNORMAL LOW (ref 3.5–5.0)
Alkaline Phosphatase: 42 U/L (ref 38–126)
Anion gap: 12 (ref 5–15)
BUN: 17 mg/dL (ref 6–20)
CO2: 22 mmol/L (ref 22–32)
Calcium: 8.7 mg/dL — ABNORMAL LOW (ref 8.9–10.3)
Chloride: 103 mmol/L (ref 98–111)
Creatinine, Ser: 1.19 mg/dL (ref 0.61–1.24)
GFR calc Af Amer: >60 mL/min (ref >60)
GFR calc non Af Amer: >60 mL/min (ref >60)
Glucose, Bld: 158 mg/dL — ABNORMAL HIGH (ref 70–99)
Potassium: 3.9 mmol/L (ref 3.5–5.1)
Sodium: 137 mmol/L (ref 135–145)
Total Bilirubin: 0.7 mg/dL (ref 0.3–1.2)
Total Protein: 6.5 g/dL (ref 6.5–8.1)

## 2020-02-12 LAB — CBC WITH DIFFERENTIAL/PLATELET
Abs Immature Granulocytes: 0.04 10*3/uL (ref 0.00–0.07)
Basophils Absolute: 0 10*3/uL (ref 0.0–0.1)
Basophils Relative: 0 %
Eosinophils Absolute: 0 10*3/uL (ref 0.0–0.5)
Eosinophils Relative: 0 %
HCT: 42.6 % (ref 39.0–52.0)
Hemoglobin: 14.7 g/dL (ref 13.0–17.0)
Immature Granulocytes: 1 %
Lymphocytes Relative: 10 %
Lymphs Abs: 0.7 10*3/uL (ref 0.7–4.0)
MCH: 31.3 pg (ref 26.0–34.0)
MCHC: 34.5 g/dL (ref 30.0–36.0)
MCV: 90.8 fL (ref 80.0–100.0)
Monocytes Absolute: 0.3 10*3/uL (ref 0.1–1.0)
Monocytes Relative: 4 %
Neutro Abs: 6.1 10*3/uL (ref 1.7–7.7)
Neutrophils Relative %: 85 %
Platelets: 449 10*3/uL — ABNORMAL HIGH (ref 150–400)
RBC: 4.69 MIL/uL (ref 4.22–5.81)
RDW: 12.5 % (ref 11.5–15.5)
WBC: 7.2 10*3/uL (ref 4.0–10.5)
nRBC: 0 % (ref 0.0–0.2)

## 2020-02-12 LAB — PHOSPHORUS: Phosphorus: 1.4 mg/dL — ABNORMAL LOW (ref 2.5–4.6)

## 2020-02-12 LAB — C-REACTIVE PROTEIN: CRP: 1.5 mg/dL — ABNORMAL HIGH (ref <1.0)

## 2020-02-12 LAB — D-DIMER, QUANTITATIVE: D-Dimer, Quant: 0.41 ug/mL-FEU (ref 0.00–0.50)

## 2020-02-12 LAB — FERRITIN: Ferritin: 539 ng/mL — ABNORMAL HIGH (ref 24–336)

## 2020-02-12 LAB — MAGNESIUM: Magnesium: 2.4 mg/dL (ref 1.7–2.4)

## 2020-02-12 NOTE — Progress Notes (Signed)
SATURATION QUALIFICATIONS: (This note is used to comply with regulatory documentation for home oxygen)  Patient Saturations on Room Air at Rest = 100%  Patient Saturations on Room Air while Ambulating = 90-96%

## 2020-02-12 NOTE — Progress Notes (Signed)
Patient ambulated in hallway and was more winded than this morning walk.  Sats down as low as 84% but hung between 87-90% most of the walk. A walker used for stability and gait was good with walker. Patient was SOB and recover to 97% on Room air in about 2 min of rest. We stop 1 during the walk for him to recover. He complain of chest tightness when ambulating but it stopped when patient was resting.

## 2020-02-12 NOTE — Progress Notes (Signed)
PROGRESS NOTE                                                                                                                                                                                                             Patient Demographics:    Anthony Rowe, is a 38 y.o. adult, DOB - January 25, 1981, LNL:[id number]  Admit date - 02/09/2020   Admitting Physician Karmen Bongo, MD  Outpatient Primary MD for the patient is Nolene Ebbs, MD  LOS - 2   No chief complaint on file.      Brief Narrative     Anthony Rowe is a 39 y.o. adult with medical history significant of HTN; transgender; and hyperthyroidism presenting with SOB, worsening COVID-19 symptoms.  Anthony Rowe went to [place] from 3/26-28.  Shortly after arrival, Anthony Rowe noticed abdominal discomfort and diarrhea.  Anthony Rowe has had weakness, fatigue, and myalgias.  Anthony Rowe went to [facility] after arriving home and tested positive for COVID.  His symptoms have been persistent and Anthony Rowe has been unable to eat or drink.  Anthony Rowe has been feeling SOB with ambulation and just isn't getting better.    Subjective:    Anthony Rowe today reports some cough, and significant dyspnea with exertion, unsteady gait with activity this morning .   Assessment  & Plan :    Principal Problem:   Acute respiratory disease due to COVID-19 virus Active Problems:   HTN (hypertension)   Asthma   Male-to-male transgender person   Hypothyroidism   Depression with anxiety   Obesity (BMI 30.0-34.9)  Acute hypoxic respiratory failure due to COVID-19 pneumonia. -No hypoxia at rest, toxic initially with activity, this morning no hypoxia with activity, but Anthony Rowe remains with significant dyspnea, and unsteady gait. -Chest x-ray significant for bilateral multifocal opacity. -Continue with IV steroids. -Continue with IV remdesivir. -Encouraged use incentive spirometry and flutter valve, out of bed to chair. -Trend inflammatory  markers.    COVID-19 Labs  Recent Labs    02/10/20 0801 02/11/20 0253 02/12/20 0329  DDIMER 0.75* 0.65* 0.41  FERRITIN 509* 602* 539*  LDH 478*  --   --   CRP 4.1* 4.5* 1.5*   Hyperthyroidism -> Hypothyroidism -s/p RAI -Synthroid increased in Feb -Continue Synthroid at current dose for now  HTN -Hold both HCTZ and Lisinopril for now due to mildly elevated creatinine -  Continue Toprol XL  Asthma -Continue home meds - Flonase, Flovent, Xyzal, Singulair - no wheezing.  Transgender -Anthony Rowe is still in transition -Prefers to be called Anthony Rowe  -Continue testosterone and Elagolix  Depression/anxiety -Continue Wellbutrin, BuSpar, hydroxyzine, trazodone  Obesity -Body mass index is 33.19 kg/m. -Weight loss should be encouraged    Code Status : Full  Family communication: patient is Coherent good understanding of his disease, Anthony Rowe was updated, Anthony Rowe will update family  Disposition Plan  : home  Barriers For Discharge : Significantly dyspneic with minimal activity, with unsteady gait, unsafe for discharge today, continue with IV remdesivir and IV steroids .  Consults  : None  Procedures  : None  DVT Prophylaxis  :  [place] lovenox  Lab Results  Component Value Date   PLT 449 (H) 02/12/2020    Antibiotics  :   Anti-infectives (From admission, onward)   Start     Dose/Rate Route Frequency Ordered Stop   02/11/20 1000  remdesivir 100 mg in sodium chloride 0.9 % 100 mL IVPB     100 mg 200 mL/hr over 30 Minutes Intravenous Daily 02/10/20 1255 02/15/20 0959   02/10/20 1300  remdesivir 200 mg in sodium chloride 0.9% 250 mL IVPB     200 mg 580 mL/hr over 30 Minutes Intravenous Once 02/10/20 1255 02/10/20 1900        Objective:   Vitals:   02/11/20 1821 02/11/20 2016 02/11/20 2125 02/12/20 0454  BP: 93/66 99/69  97/66  Pulse: 66 74  61  Resp: 18 18  18   Temp: 98.1 F (36.7 C) 98.9 F (37.2 C)  98 F (36.7 C)  TempSrc: Oral Oral  Oral  SpO2: 90%  95% 99%   Weight:      Height:        Wt Readings from Last 3 Encounters:  02/09/20 85 kg  12/20/19 84.2 kg  11/06/19 84.4 kg     Intake/Output Summary (Last 24 hours) at 02/12/2020 1220 Last data filed at 02/12/2020 0000 Gross per 24 hour  Intake 220 ml  Output --  Net 220 ml     Physical Exam  Awake Alert, Oriented X 3, No new F.N deficits, Normal affect Symmetrical Chest wall movement, Good air movement bilaterally, CTAB RRR,No Gallops,Rubs or new Murmurs, No Parasternal Heave +ve B.Sounds, Abd Soft, No tenderness, No rebound - guarding or rigidity. No Cyanosis, Clubbing or edema, No new Rash or bruise       Data Review:    CBC Recent Labs  Lab 02/09/20 2204 02/11/20 0253 02/12/20 0329  WBC 4.9 2.3* 7.2  HGB 15.9 15.1 14.7  HCT 46.0 44.6 42.6  PLT 305 366 449*  MCV 89.7 92.9 90.8  MCH 31.0 31.5 31.3  MCHC 34.6 33.9 34.5  RDW 12.4 12.7 12.5  LYMPHSABS 0.9 0.4* 0.7  MONOABS 0.2 0.1 0.3  EOSABS 0.0 0.0 0.0  BASOSABS 0.0 0.0 0.0    Chemistries  Recent Labs  Lab 02/09/20 2204 02/11/20 0253 02/12/20 0329  NA 136 136 137  K 3.4* 4.1 3.9  CL 98 103 103  CO2 23 21* 22  GLUCOSE 99 123* 158*  BUN 20 19 17   CREATININE 1.59* 1.17 1.19  CALCIUM 8.7* 8.5* 8.7*  MG  --  2.6* 2.4  AST 54* 56* 24  ALT 36 45* 35  ALKPHOS 48 46 42  BILITOT 1.4* 0.9 0.7   ------------------------------------------------------------------------------------------------------------------ Recent Labs    02/10/20 0801  TRIG 201*    No  results found for: HGBA1C ------------------------------------------------------------------------------------------------------------------ Recent Labs    02/10/20 1315  TSH 24.877*   ------------------------------------------------------------------------------------------------------------------ Recent Labs    02/11/20 0253 02/12/20 0329  FERRITIN 602* 539*    Coagulation profile No results for input(s): INR, PROTIME in the last 168  hours.  Recent Labs    02/11/20 0253 02/12/20 0329  DDIMER 0.65* 0.41    Cardiac Enzymes No results for input(s): CKMB, TROPONINI, MYOGLOBIN in the last 168 hours.  Invalid input(s): CK ------------------------------------------------------------------------------------------------------------------ No results found for: BNP  Inpatient Medications  Scheduled Meds: . buPROPion  150 mg Oral Daily  . busPIRone  10 mg Oral BID  . dexamethasone (DECADRON) injection  6 mg Intravenous Q24H  . enoxaparin (LOVENOX) injection  40 mg Subcutaneous Q24H  . famotidine  20 mg Oral BID  . fluticasone  1 spray Each Nare Daily  . gabapentin  200 mg Oral QHS  . levothyroxine  175 mcg Oral QAC breakfast  . loratadine  5 mg Oral BID  . metoprolol succinate  100 mg Oral Daily  . montelukast  10 mg Oral QHS  . sodium chloride flush  3 mL Intravenous Q12H   Continuous Infusions: . sodium chloride    . remdesivir 100 mg in NS 100 mL 100 mg (02/12/20 1028)   PRN Meds:.sodium chloride, acetaminophen, albuterol, bisacodyl, chlorpheniramine-HYDROcodone, guaiFENesin-dextromethorphan, hydrOXYzine, loperamide, ondansetron **OR** ondansetron (ZOFRAN) IV, oxyCODONE, polyethylene glycol, sodium chloride flush, sodium phosphate, traZODone  Micro Results No results found for this or any previous visit (from the past 240 hour(s)).  Radiology Reports DG Chest Portable 1 View  Result Date: 02/09/2020 CLINICAL DATA:  39 year old male with shortness of breath and cough. Positive COVID-19. EXAM: PORTABLE CHEST 1 VIEW COMPARISON:  Chest radiograph dated 02/15/2016. FINDINGS: Bilateral mid to lower lung field confluent and streaky densities most consistent with multifocal pneumonia and in keeping with known COVID-19. There is no pleural effusion or pneumothorax. The cardiac silhouette is within normal limits. No acute osseous pathology. IMPRESSION: Multifocal pneumonia. Electronically Signed   By: Elgie Collard  M.D.   On: 02/09/2020 23:35      Huey Bienenstock M.D on 02/12/2020 at 12:20 PM  Between 7am to 7pm - Pager - [phone]  After 7pm go to [email or web address] - password [facility]  [facility] -  Office  [phone]

## 2020-02-13 DIAGNOSIS — I10 Essential (primary) hypertension: Secondary | ICD-10-CM | POA: Diagnosis not present

## 2020-02-13 DIAGNOSIS — U071 COVID-19: Secondary | ICD-10-CM | POA: Diagnosis not present

## 2020-02-13 DIAGNOSIS — F640 Transsexualism: Secondary | ICD-10-CM | POA: Diagnosis not present

## 2020-02-13 DIAGNOSIS — J069 Acute upper respiratory infection, unspecified: Secondary | ICD-10-CM | POA: Diagnosis not present

## 2020-02-13 LAB — CBC WITH DIFFERENTIAL/PLATELET
Abs Immature Granulocytes: 0.04 10*3/uL (ref 0.00–0.07)
Basophils Absolute: 0 10*3/uL (ref 0.0–0.1)
Basophils Relative: 0 %
Eosinophils Absolute: 0 10*3/uL (ref 0.0–0.5)
Eosinophils Relative: 0 %
HCT: 42.2 % (ref 39.0–52.0)
Hemoglobin: 14.2 g/dL (ref 13.0–17.0)
Immature Granulocytes: 1 %
Lymphocytes Relative: 14 %
Lymphs Abs: 0.9 10*3/uL (ref 0.7–4.0)
MCH: 30.7 pg (ref 26.0–34.0)
MCHC: 33.6 g/dL (ref 30.0–36.0)
MCV: 91.1 fL (ref 80.0–100.0)
Monocytes Absolute: 0.4 10*3/uL (ref 0.1–1.0)
Monocytes Relative: 6 %
Neutro Abs: 5.1 10*3/uL (ref 1.7–7.7)
Neutrophils Relative %: 79 %
Platelets: 479 10*3/uL — ABNORMAL HIGH (ref 150–400)
RBC: 4.63 MIL/uL (ref 4.22–5.81)
RDW: 12.7 % (ref 11.5–15.5)
WBC: 6.4 10*3/uL (ref 4.0–10.5)
nRBC: 0 % (ref 0.0–0.2)

## 2020-02-13 LAB — COMPREHENSIVE METABOLIC PANEL
ALT: 28 U/L (ref 0–44)
AST: 18 U/L (ref 15–41)
Albumin: 3.1 g/dL — ABNORMAL LOW (ref 3.5–5.0)
Alkaline Phosphatase: 43 U/L (ref 38–126)
Anion gap: 10 (ref 5–15)
BUN: 15 mg/dL (ref 6–20)
CO2: 24 mmol/L (ref 22–32)
Calcium: 8.7 mg/dL — ABNORMAL LOW (ref 8.9–10.3)
Chloride: 103 mmol/L (ref 98–111)
Creatinine, Ser: 1.19 mg/dL (ref 0.61–1.24)
GFR calc Af Amer: >60 mL/min (ref >60)
GFR calc non Af Amer: >60 mL/min (ref >60)
Glucose, Bld: 147 mg/dL — ABNORMAL HIGH (ref 70–99)
Potassium: 3.7 mmol/L (ref 3.5–5.1)
Sodium: 137 mmol/L (ref 135–145)
Total Bilirubin: 0.8 mg/dL (ref 0.3–1.2)
Total Protein: 6.3 g/dL — ABNORMAL LOW (ref 6.5–8.1)

## 2020-02-13 LAB — D-DIMER, QUANTITATIVE: D-Dimer, Quant: 0.45 ug/mL-FEU (ref 0.00–0.50)

## 2020-02-13 LAB — MAGNESIUM: Magnesium: 2.2 mg/dL (ref 1.7–2.4)

## 2020-02-13 LAB — FERRITIN: Ferritin: 534 ng/mL — ABNORMAL HIGH (ref 24–336)

## 2020-02-13 LAB — PHOSPHORUS: Phosphorus: 2.2 mg/dL — ABNORMAL LOW (ref 2.5–4.6)

## 2020-02-13 LAB — C-REACTIVE PROTEIN: CRP: 0.7 mg/dL (ref <1.0)

## 2020-02-13 MED ORDER — POTASSIUM CHLORIDE CRYS ER 20 MEQ PO TBCR
40.0000 meq | EXTENDED_RELEASE_TABLET | Freq: Once | ORAL | Status: AC
  Administered 2020-02-13: 40 meq via ORAL
  Filled 2020-02-13: qty 2

## 2020-02-13 MED ORDER — FUROSEMIDE 10 MG/ML IJ SOLN
40.0000 mg | Freq: Four times a day (QID) | INTRAMUSCULAR | Status: AC
  Administered 2020-02-13 (×2): 40 mg via INTRAVENOUS
  Filled 2020-02-13 (×2): qty 4

## 2020-02-13 MED ORDER — METOPROLOL SUCCINATE ER 25 MG PO TB24
25.0000 mg | ORAL_TABLET | Freq: Every day | ORAL | Status: DC
  Administered 2020-02-14: 25 mg via ORAL
  Filled 2020-02-13: qty 1

## 2020-02-13 MED ORDER — K PHOS MONO-SOD PHOS DI & MONO 155-852-130 MG PO TABS
500.0000 mg | ORAL_TABLET | Freq: Two times a day (BID) | ORAL | Status: DC
  Administered 2020-02-13 – 2020-02-14 (×3): 500 mg via ORAL
  Filled 2020-02-13 (×3): qty 2

## 2020-02-13 NOTE — Progress Notes (Signed)
SATURATION QUALIFICATIONS: (This note is used to comply with regulatory documentation for home oxygen)  Patient Saturations on Room Air at Rest = 97%  Patient Saturations on Room Air while Ambulating = 89%  Patient Saturations on 2 Liters of oxygen while Ambulating = 96%

## 2020-02-13 NOTE — Progress Notes (Addendum)
   02/13/20 1457  Vitals  BP (!) 89/60  MAP (mmHg) 69  Pulse Rate 80  Oxygen Therapy  SpO2 95 %  MEWS Score  MEWS Temp 0  MEWS Systolic 1  MEWS Pulse 0  MEWS RR 1  MEWS LOC 0  MEWS Score 2  MEWS Score Color Yellow  Provider Notification  Provider Name/Title Huey Bienenstock, MD  Date Provider Notified 02/13/20  Time Provider Notified 1501  Notification Type Face-to-face  Notification Reason Other (Comment) (to make aware)    Patient asymptomatic. Dr. Randol Kern made aware of BP. Will continue to monitor.  Lyndal Pulley, RN 02/13/2020 4:22 PM

## 2020-02-13 NOTE — Progress Notes (Signed)
PROGRESS NOTE                                                                                                                                                                                                             Patient Demographics:    Anthony Rowe, is a 39 y.o. adult, DOB - [birth year], [id number]  Admit date - 02/09/2020   Admitting Physician Anthony Blue, MD  Outpatient Primary MD for the patient is Anthony Contras, MD  LOS - 3   No chief complaint on file.      Brief Narrative     Anthony Rowe is a 39 y.o. adult with medical history significant of HTN; transgender; and hyperthyroidism presenting with SOB, worsening COVID-19 symptoms.  He went to [place] from 3/26-28.  Shortly after arrival, he noticed abdominal discomfort and diarrhea.  He has had weakness, fatigue, and myalgias.  [facility] after arriving home and tested positive for COVID.  His symptoms have been persistent and he has been unable to eat or drink.  He has been feeling SOB with ambulation and just isn't getting better.    Subjective:    Anthony Rowe today reports some cough, and significant dyspnea with exertion, denies any fever, chest pain.   Assessment  & Plan :    Principal Problem:   Acute respiratory disease due to COVID-19 virus Active Problems:   HTN (hypertension)   Asthma   Male-to-male transgender person   Hypothyroidism   Depression with anxiety   Obesity (BMI 30.0-34.9)  Acute hypoxic respiratory failure due to COVID-19 pneumonia. -Hypoxia at rest, but hypoxic with activity . -Chest x-ray significant for bilateral multifocal opacity. -Continue with IV steroids. -Continue with IV remdesivir. -Encouraged use incentive spirometry and flutter valve, out of bed to chair. -Trend inflammatory markers. -She received Lasix 40 mg IV x2 today.  We will hold on any further Lasix given soft blood pressure    COVID-19  Labs  Recent Labs    02/11/20 0253 02/12/20 0329 02/13/20 0324  DDIMER 0.65* 0.41 0.45  FERRITIN 602* 539* 534*  CRP 4.5* 1.5* 0.7   Hyperthyroidism -> Hypothyroidism -s/p RAI -Synthroid increased in Feb -Continue Synthroid at current dose for now  HTN -soft blood pressure is soft, so hydrochlorothiazide and lisinopril are on hold, I will go ahead and decrease  his Toprol-XL from 100 mg to 25 mg oral daily given he still having soft blood pressure.  Asthma -Continue home meds - Flonase, Flovent, Xyzal, Singulair - no wheezing.  Hypophosphatemia -Repleted with Neutra-Phos  Transgender -He is still in transition -Prefers to be called Anthony Rowe  -Continue testosterone and Elagolix  Depression/anxiety -Continue Wellbutrin, BuSpar, hydroxyzine, trazodone  Obesity -Body mass index is 33.19 kg/m. -Weight loss should be encouraged    Code Status : Full  Family communication: patient is Coherent good understanding of his disease, he will update family  Disposition Plan  : home  Barriers For Discharge : Remains on IV steroids, IV remdesivir, he did receive IV Lasix 40 mg and 2 today, will need close monitoring of renal function and vital signs .  Consults  : None  Procedures  : None  DVT Prophylaxis  :  [place] lovenox  Lab Results  Component Value Date   PLT 479 (H) 02/13/2020    Antibiotics  :   Anti-infectives (From admission, onward)   Start     Dose/Rate Route Frequency Ordered Stop   02/11/20 1000  remdesivir 100 mg in sodium chloride 0.9 % 100 mL IVPB     100 mg 200 mL/hr over 30 Minutes Intravenous Daily 02/10/20 1255 02/15/20 0959   02/10/20 1300  remdesivir 200 mg in sodium chloride 0.9% 250 mL IVPB     200 mg 580 mL/hr over 30 Minutes Intravenous Once 02/10/20 1255 02/10/20 1900        Objective:   Vitals:   02/13/20 0910 02/13/20 1047 02/13/20 1049 02/13/20 1457  BP:    (!) 89/60  Pulse: 88 76  80  Resp:      Temp:      TempSrc:       SpO2: 97% (!) 89% 94% 95%  Weight:      Height:        Wt Readings from Last 3 Encounters:  02/09/20 85 kg  12/20/19 84.2 kg  11/06/19 84.4 kg     Intake/Output Summary (Last 24 hours) at 02/13/2020 1623 Last data filed at 02/13/2020 1527 Gross per 24 hour  Intake 720 ml  Output 2075 ml  Net -1355 ml     Physical Exam  Awake Alert, Oriented X 3, No new F.N deficits, Normal affect Symmetrical Chest wall movement, Good air movement bilaterally, CTAB RRR,No Gallops,Rubs or new Murmurs, No Parasternal Heave +ve B.Sounds, Abd Soft, No tenderness, No rebound - guarding or rigidity. No Cyanosis, Clubbing , trace  edema, No new Rash or bruise        Data Review:    CBC Recent Labs  Lab 02/09/20 2204 02/11/20 0253 02/12/20 0329 02/13/20 0324  WBC 4.9 2.3* 7.2 6.4  HGB 15.9 15.1 14.7 14.2  HCT 46.0 44.6 42.6 42.2  PLT 305 366 449* 479*  MCV 89.7 92.9 90.8 91.1  MCH 31.0 31.5 31.3 30.7  MCHC 34.6 33.9 34.5 33.6  RDW 12.4 12.7 12.5 12.7  LYMPHSABS 0.9 0.4* 0.7 0.9  MONOABS 0.2 0.1 0.3 0.4  EOSABS 0.0 0.0 0.0 0.0  BASOSABS 0.0 0.0 0.0 0.0    Chemistries  Recent Labs  Lab 02/09/20 2204 02/11/20 0253 02/12/20 0329 02/13/20 0324  NA 136 136 137 137  K 3.4* 4.1 3.9 3.7  CL 98 103 103 103  CO2 23 21* 22 24  GLUCOSE 99 123* 158* 147*  BUN 20 19 17 15   CREATININE 1.59* 1.17 1.19 1.19  CALCIUM 8.7* 8.5* 8.7* 8.7*  MG  --  2.6* 2.4 2.2  AST 54* 56* 24 18  ALT 36 45* 35 28  ALKPHOS 48 46 42 43  BILITOT 1.4* 0.9 0.7 0.8   ------------------------------------------------------------------------------------------------------------------ No results for input(s): CHOL, HDL, LDLCALC, TRIG, CHOLHDL, LDLDIRECT in the last 72 hours.  No results found for: HGBA1C ------------------------------------------------------------------------------------------------------------------ No results for input(s): TSH, T4TOTAL, T3FREE, THYROIDAB in the last 72 hours.  Invalid  input(s): FREET3 ------------------------------------------------------------------------------------------------------------------ Recent Labs    02/12/20 0329 02/13/20 0324  FERRITIN 539* 534*    Coagulation profile No results for input(s): INR, PROTIME in the last 168 hours.  Recent Labs    02/12/20 0329 02/13/20 0324  DDIMER 0.41 0.45    Cardiac Enzymes No results for input(s): CKMB, TROPONINI, MYOGLOBIN in the last 168 hours.  Invalid input(s): CK ------------------------------------------------------------------------------------------------------------------ No results found for: BNP  Inpatient Medications  Scheduled Meds: . buPROPion  150 mg Oral Daily  . busPIRone  10 mg Oral BID  . dexamethasone (DECADRON) injection  6 mg Intravenous Q24H  . enoxaparin (LOVENOX) injection  40 mg Subcutaneous Q24H  . famotidine  20 mg Oral BID  . fluticasone  1 spray Each Nare Daily  . gabapentin  200 mg Oral QHS  . levothyroxine  175 mcg Oral QAC breakfast  . loratadine  5 mg Oral BID  . [START ON 02/14/2020] metoprolol succinate  25 mg Oral Daily  . montelukast  10 mg Oral QHS  . phosphorus  500 mg Oral BID  . sodium chloride flush  3 mL Intravenous Q12H   Continuous Infusions: . sodium chloride    . remdesivir 100 mg in NS 100 mL 100 mg (02/13/20 0851)   PRN Meds:.sodium chloride, acetaminophen, albuterol, bisacodyl, chlorpheniramine-HYDROcodone, guaiFENesin-dextromethorphan, hydrOXYzine, loperamide, ondansetron **OR** ondansetron (ZOFRAN) IV, oxyCODONE, polyethylene glycol, sodium chloride flush, sodium phosphate, traZODone  Micro Results No results found for this or any previous visit (from the past 240 hour(s)).  Radiology Reports DG Chest Portable 1 View  Result Date: 02/09/2020 CLINICAL DATA:  39 year old male with shortness of breath and cough. Positive COVID-19. EXAM: PORTABLE CHEST 1 VIEW COMPARISON:  Chest radiograph dated 02/15/2016. FINDINGS: Bilateral mid  to lower lung field confluent and streaky densities most consistent with multifocal pneumonia and in keeping with known COVID-19. There is no pleural effusion or pneumothorax. The cardiac silhouette is within normal limits. No acute osseous pathology. IMPRESSION: Multifocal pneumonia. Electronically Signed   By: Anner Crete M.D.   On: 02/09/2020 23:35      Phillips Climes M.D on 02/13/2020 at 4:23 PM  After 7pm go to [email or web address]   [facility] -  Office  [phone]

## 2020-02-14 DIAGNOSIS — F418 Other specified anxiety disorders: Secondary | ICD-10-CM | POA: Diagnosis not present

## 2020-02-14 DIAGNOSIS — J069 Acute upper respiratory infection, unspecified: Secondary | ICD-10-CM | POA: Diagnosis not present

## 2020-02-14 DIAGNOSIS — U071 COVID-19: Secondary | ICD-10-CM | POA: Diagnosis not present

## 2020-02-14 DIAGNOSIS — I10 Essential (primary) hypertension: Secondary | ICD-10-CM | POA: Diagnosis not present

## 2020-02-14 LAB — CBC WITH DIFFERENTIAL/PLATELET
Abs Immature Granulocytes: 0.06 10*3/uL (ref 0.00–0.07)
Basophils Absolute: 0 10*3/uL (ref 0.0–0.1)
Basophils Relative: 0 %
Eosinophils Absolute: 0 10*3/uL (ref 0.0–0.5)
Eosinophils Relative: 0 %
HCT: 45.2 % (ref 39.0–52.0)
Hemoglobin: 15.6 g/dL (ref 13.0–17.0)
Immature Granulocytes: 1 %
Lymphocytes Relative: 17 %
Lymphs Abs: 1.2 10*3/uL (ref 0.7–4.0)
MCH: 31.6 pg (ref 26.0–34.0)
MCHC: 34.5 g/dL (ref 30.0–36.0)
MCV: 91.7 fL (ref 80.0–100.0)
Monocytes Absolute: 0.5 10*3/uL (ref 0.1–1.0)
Monocytes Relative: 6 %
Neutro Abs: 5.3 10*3/uL (ref 1.7–7.7)
Neutrophils Relative %: 76 %
Platelets: 551 10*3/uL — ABNORMAL HIGH (ref 150–400)
RBC: 4.93 MIL/uL (ref 4.22–5.81)
RDW: 12.8 % (ref 11.5–15.5)
WBC: 7.1 10*3/uL (ref 4.0–10.5)
nRBC: 0 % (ref 0.0–0.2)

## 2020-02-14 LAB — D-DIMER, QUANTITATIVE: D-Dimer, Quant: <0.27 ug/mL-FEU (ref 0.00–0.50)

## 2020-02-14 LAB — COMPREHENSIVE METABOLIC PANEL
ALT: 28 U/L (ref 0–44)
AST: 20 U/L (ref 15–41)
Albumin: 3.4 g/dL — ABNORMAL LOW (ref 3.5–5.0)
Alkaline Phosphatase: 48 U/L (ref 38–126)
Anion gap: 12 (ref 5–15)
BUN: 18 mg/dL (ref 6–20)
CO2: 23 mmol/L (ref 22–32)
Calcium: 9.2 mg/dL (ref 8.9–10.3)
Chloride: 102 mmol/L (ref 98–111)
Creatinine, Ser: 1.39 mg/dL — ABNORMAL HIGH (ref 0.61–1.24)
GFR calc Af Amer: >60 mL/min (ref >60)
GFR calc non Af Amer: >60 mL/min (ref >60)
Glucose, Bld: 134 mg/dL — ABNORMAL HIGH (ref 70–99)
Potassium: 4.3 mmol/L (ref 3.5–5.1)
Sodium: 137 mmol/L (ref 135–145)
Total Bilirubin: 1.1 mg/dL (ref 0.3–1.2)
Total Protein: 6.8 g/dL (ref 6.5–8.1)

## 2020-02-14 LAB — FERRITIN: Ferritin: 689 ng/mL — ABNORMAL HIGH (ref 24–336)

## 2020-02-14 LAB — C-REACTIVE PROTEIN: CRP: <0.5 mg/dL (ref <1.0)

## 2020-02-14 LAB — PHOSPHORUS: Phosphorus: 3.2 mg/dL (ref 2.5–4.6)

## 2020-02-14 LAB — MAGNESIUM: Magnesium: 2.1 mg/dL (ref 1.7–2.4)

## 2020-02-14 MED ORDER — METOPROLOL SUCCINATE ER 25 MG PO TB24: 25.0000 mg | ORAL_TABLET | Freq: Every day | ORAL | 0 refills | Status: DC

## 2020-02-14 MED ORDER — DEXAMETHASONE 6 MG PO TABS: 6.0000 mg | ORAL_TABLET | Freq: Every day | ORAL | 0 refills | Status: DC

## 2020-02-14 MED ORDER — BENZONATATE 100 MG PO CAPS: 100.0000 mg | ORAL_CAPSULE | Freq: Four times a day (QID) | ORAL | 0 refills | Status: AC | PRN

## 2020-02-14 MED FILL — METOPROLOL SUCCINATE ER 25: 25 | 30 days supply | Qty: 30 | Fill #0

## 2020-02-14 MED FILL — DEXAMETHASONE 6 MG TABLET: 6 | 3 days supply | Qty: 3 | Fill #0

## 2020-02-14 MED FILL — BENZONATATE 100 MG CAP: 100 | 7 days supply | Qty: 30 | Fill #0

## 2020-02-14 NOTE — Care Management Important Message (Signed)
Important Message  Patient Details  Name: Anthony Rowe MRN: [id number] Date of Birth: Aug 27, 1981   Medicare Important Message Given:  Yes - Important Message mailed due to current National Emergency  Verbal consent obtained due to current National Emergency  Relationship to patient: Self Contact Name: Kyzer Blowe Call Date: 02/14/20  Time: 1114 Phone: [phone] Outcome: Spoke with contact Important Message mailed to: Other (must enter comment)(patient declined an additional copy of IM)    Penn Grissett P Solana Coggin 02/14/2020, 11:15 AM

## 2020-02-14 NOTE — Discharge Summary (Signed)
PATIENT DETAILS Name: Anthony Rowe Age: 40 y.o. Sex: adult Date of Birth: 1981/01/06 MRN: [id number]. Admitting Physician: Jonah Blue, MD [id number], Dorma Russell, MD  Admit Date: 02/09/2020 Discharge date: 02/14/2020  Recommendations for Outpatient Follow-up:  1. Follow up with PCP in 1-2 weeks 2. Please obtain CMP/CBC in one week 3. Repeat Chest Xray in 4-6 week  Admitted From:  Home  Disposition: Home   Home Health: No  Equipment/Devices: None  Discharge Condition: Stable  CODE STATUS: FULL CODE  Diet recommendation:  Diet Order            Diet - low sodium heart healthy        Diet regular Room service appropriate? Yes; Fluid consistency: Thin  Diet effective now               Brief Summary: See H&P, Labs, Consult and Test reports for all details in brief, patient is a 39 year old transgender male with history of HTN, hypothyroidism-who presented with acute hypoxic respiratory failure secondary to COVID-19 pneumonia.  Brief Hospital Course: Acute Hypoxic Respiratory Failure due to COVID-19 pneumonia: Improved-has been weaned off oxygen.  Will finish remdesivir on 4/14-following which he will be discharged home.  We will continue with Decadron for a few more days.  COVID-19 Labs:  Recent Labs    02/12/20 0329 02/13/20 0324 02/14/20 0226  DDIMER 0.41 0.45 <0.27  FERRITIN 539* 534* 689*  CRP 1.5* 0.7 <0.5    Lab Results  Component Value Date   SARSCOV2NAA NOT DETECTED 07/14/2019    Hypothyroidism: Continue Synthroid-follow-up with PCP for adjustment of levothyroxine dosage.  TSH on 4/10 still slightly elevated at 24.8.  HTN: Blood pressure soft-no longer on HCTZ, lisinopril-tolerating 25 mg of Toprol-XL.  Continue to hold HCTZ on discharge-have asked patient to follow-up with his primary care practitioner-suspect will need titration of his BP medications at next visit.  Asthma: Stable-no wheezing-continue Flovent and as needed albuterol  inhaler.  Depression/anxiety: Stable-denies suicidal/homicidal ideation-continue Wellbutrin, BuSpar  Transgender male: Currently in transition-continue testosterone  Obesity: Estimated body mass index is 33.19 kg/m as calculated from the following:   Height as of this encounter: 5\' 3"  (1.6 m).   Weight as of this encounter: 85 kg.   Procedures/Studies: None  Discharge Diagnoses:  Principal Problem:   Acute respiratory disease due to COVID-19 virus Active Problems:   HTN (hypertension)   Asthma   Male-to-male transgender person   Hypothyroidism   Depression with anxiety   Obesity (BMI 30.0-34.9)   Discharge Instructions:    Person Under Monitoring Name: Anthony Rowe  Location: [address] [place]   Infection Prevention Recommendations for Individuals Confirmed to have, or Being Evaluated for, 2019 Novel Coronavirus (COVID-19) Infection Who Receive Care at Home  Individuals who are confirmed to have, or are being evaluated for, COVID-19 should follow the prevention steps below until a healthcare provider or local or state health department says they can return to normal activities.  Stay home except to get medical care You should restrict activities outside your home, except for getting medical care. Do not go to work, school, or public areas, and do not use public transportation or taxis.  Call ahead before visiting your doctor Before your medical appointment, call the healthcare provider and tell them that you have, or are being evaluated for, COVID-19 infection. This will help the healthcare provider's office take steps to keep other people from getting infected. Ask your healthcare provider to call the local or  state health department.  Monitor your symptoms Seek prompt medical attention if your illness is worsening (e.g., difficulty breathing). Before going to your medical appointment, call the healthcare provider and tell them that  you have, or are being evaluated for, COVID-19 infection. Ask your healthcare provider to call the local or state health department.  Wear a facemask You should wear a facemask that covers your nose and mouth when you are in the same room with other people and when you visit a healthcare provider. People who live with or visit you should also wear a facemask while they are in the same room with you.  Separate yourself from other people in your home As much as possible, you should stay in a different room from other people in your home. Also, you should use a separate bathroom, if available.  Avoid sharing household items You should not share dishes, drinking glasses, cups, eating utensils, towels, bedding, or other items with other people in your home. After using these items, you should wash them thoroughly with soap and water.  Cover your coughs and sneezes Cover your mouth and nose with a tissue when you cough or sneeze, or you can cough or sneeze into your sleeve. Throw used tissues in a lined trash can, and immediately wash your hands with soap and water for at least 20 seconds or use an alcohol-based hand rub.  Wash your [employer] your hands often and thoroughly with soap and water for at least 20 seconds. You can use an alcohol-based hand sanitizer if soap and water are not available and if your hands are not visibly dirty. Avoid touching your eyes, nose, and mouth with unwashed hands.   Prevention Steps for Caregivers and Household Members of Individuals Confirmed to have, or Being Evaluated for, COVID-19 Infection Being Cared for in the Home  If you live with, or provide care at home for, a person confirmed to have, or being evaluated for, COVID-19 infection please follow these guidelines to prevent infection:  Follow healthcare provider's instructions Make sure that you understand and can help the patient follow any healthcare provider instructions for all  care.  Provide for the patient's basic needs You should help the patient with basic needs in the home and provide support for getting groceries, prescriptions, and other personal needs.  Monitor the patient's symptoms If they are getting sicker, call his or her medical provider and tell them that the patient has, or is being evaluated for, COVID-19 infection. This will help the healthcare provider's office take steps to keep other people from getting infected. Ask the healthcare provider to call the local or state health department.  Limit the number of people who have contact with the patient  If possible, have only one caregiver for the patient.  Other household members should stay in another home or place of residence. If this is not possible, they should stay  in another room, or be separated from the patient as much as possible. Use a separate bathroom, if available.  Restrict visitors who do not have an essential need to be in the home.  Keep older adults, very young children, and other sick people away from the patient Keep older adults, very young children, and those who have compromised immune systems or chronic health conditions away from the patient. This includes people with chronic heart, lung, or kidney conditions, diabetes, and cancer.  Ensure good ventilation Make sure that shared spaces in the home have good air flow, such as  from an air conditioner or an opened window, weather permitting.  Wash your hands often  Wash your hands often and thoroughly with soap and water for at least 20 seconds. You can use an alcohol based hand sanitizer if soap and water are not available and if your hands are not visibly dirty.  Avoid touching your eyes, nose, and mouth with unwashed hands.  Use disposable paper towels to dry your hands. If not available, use dedicated cloth towels and replace them when they become wet.  Wear a facemask and gloves  Wear a disposable facemask at  all times in the room and gloves when you touch or have contact with the patient's blood, body fluids, and/or secretions or excretions, such as sweat, saliva, sputum, nasal mucus, vomit, urine, or feces.  Ensure the mask fits over your nose and mouth tightly, and do not touch it during use.  Throw out disposable facemasks and gloves after using them. Do not reuse.  Wash your hands immediately after removing your facemask and gloves.  If your personal clothing becomes contaminated, carefully remove clothing and launder. Wash your hands after handling contaminated clothing.  Place all used disposable facemasks, gloves, and other waste in a lined container before disposing them with other household waste.  Remove gloves and wash your hands immediately after handling these items.  Do not share dishes, glasses, or other household items with the patient  Avoid sharing household items. You should not share dishes, drinking glasses, cups, eating utensils, towels, bedding, or other items with a patient who is confirmed to have, or being evaluated for, COVID-19 infection.  After the person uses these items, you should wash them thoroughly with soap and water.  Wash laundry thoroughly  Immediately remove and wash clothes or bedding that have blood, body fluids, and/or secretions or excretions, such as sweat, saliva, sputum, nasal mucus, vomit, urine, or feces, on them.  Wear gloves when handling laundry from the patient.  Read and follow directions on labels of laundry or clothing items and detergent. In general, wash and dry with the warmest temperatures recommended on the label.  Clean all areas the individual has used often  Clean all touchable surfaces, such as counters, tabletops, doorknobs, bathroom fixtures, toilets, phones, keyboards, tablets, and bedside tables, every day. Also, clean any surfaces that may have blood, body fluids, and/or secretions or excretions on them.  Wear gloves when  cleaning surfaces the patient has come in contact with.  Use a diluted bleach solution (e.g., dilute bleach with 1 part bleach and 10 parts water) or a household disinfectant with a label that says EPA-registered for coronaviruses. To make a bleach solution at home, add 1 tablespoon of bleach to 1 quart (4 cups) of water. For a larger supply, add  cup of bleach to 1 gallon (16 cups) of water.  Read labels of cleaning products and follow recommendations provided on product labels. Labels contain instructions for safe and effective use of the cleaning product including precautions you should take when applying the product, such as wearing gloves or eye protection and making sure you have good ventilation during use of the product.  Remove gloves and wash hands immediately after cleaning.  Monitor yourself for signs and symptoms of illness Caregivers and household members are considered close contacts, should monitor their health, and will be asked to limit movement outside of the home to the extent possible. Follow the monitoring steps for close contacts listed on the symptom monitoring form.   ?  If you have additional questions, contact your local health department or call the epidemiologist on call at [phone] (available 24/7). ? This guidance is subject to change. For the most up-to-date guidance from CDC, please refer to their website: [employer]    Activity:  As tolerated  Discharge Instructions    Call MD for:  difficulty breathing, headache or visual disturbances   Complete by: As directed    Call MD for:  extreme fatigue   Complete by: As directed    Call MD for:  persistant dizziness or light-headedness   Complete by: As directed    Diet - low sodium heart healthy   Complete by: As directed    Discharge instructions   Complete by: As directed    3 weeks of isolation from 01/29/2020   Follow with Primary MD   Fleet Contras, MD in 1-2 weeks  Continue to hold HCTZ, lisinopril on discharge as her blood pressures have been on the lower side here.  Your metoprolol dosage has been decreased to 25 mg daily.  Please get a complete blood count and chemistry panel checked by your Primary MD at your next visit, and again as instructed by your Primary MD.  Get Medicines reviewed and adjusted: Please take all your medications with you for your next visit with your Primary MD  Laboratory/radiological data: Please request your Primary MD to go over all hospital tests and procedure/radiological results at the follow up, please ask your Primary MD to get all Hospital records sent to his/her office.  In some cases, they will be blood work, cultures and biopsy results pending at the time of your discharge. Please request that your primary care M.D. follows up on these results.  Also Note the following: If you experience worsening of your admission symptoms, develop shortness of breath, life threatening emergency, suicidal or homicidal thoughts you must seek medical attention immediately by calling 911 or calling your MD immediately  if symptoms less severe.  You must read complete instructions/literature along with all the possible adverse reactions/side effects for all the Medicines you take and that have been prescribed to you. Take any new Medicines after you have completely understood and accpet all the possible adverse reactions/side effects.   Do not drive when taking Pain medications or sleeping medications (Benzodaizepines)  Do not take more than prescribed Pain, Sleep and Anxiety Medications. It is not advisable to combine anxiety,sleep and pain medications without talking with your primary care practitioner  Special Instructions: If you have smoked or chewed Tobacco  in the last 2 yrs please stop smoking, stop any regular Alcohol  and or any Recreational drug use.  Wear Seat belts while  driving.  Please note: You were cared for by a hospitalist during your hospital stay. Once you are discharged, your primary care physician will handle any further medical issues. Please note that NO REFILLS for any discharge medications will be authorized once you are discharged, as it is imperative that you return to your primary care physician (or establish a relationship with a primary care physician if you do not have one) for your post hospital discharge needs so that they can reassess your need for medications and monitor your lab values.   Increase activity slowly   Complete by: As directed      Allergies as of 02/14/2020      Reactions   Frovatriptan Other (See Comments)   Numbness in both legs   Sulfa Antibiotics Other (See Comments)   Makes  patients legs numb       Medication List    STOP taking these medications   Elagolix Sodium 150 MG Tabs   hydrochlorothiazide 25 MG tablet Commonly known as: HYDRODIURIL   lisinopril 20 MG tablet Commonly known as: ZESTRIL     TAKE these medications   albuterol 108 (90 Base) MCG/ACT inhaler Commonly known as: VENTOLIN HFA Inhale 2 puffs every 4-6 hours as needed for cough, wheeze, shortness of breath or chest tightness.   benzonatate 100 MG capsule Commonly known as: Tessalon Perles Take 1 capsule (100 mg total) by mouth every 6 (six) hours as needed for cough.   buPROPion 150 MG 24 hr tablet Commonly known as: WELLBUTRIN XL Take 150 mg by mouth daily.   busPIRone 10 MG tablet Commonly known as: BUSPAR Take 10 mg by mouth 2 (two) times daily.   cyclobenzaprine 10 MG tablet Commonly known as: FLEXERIL Take 10 mg by mouth 2 (two) times daily as needed.   dexamethasone 6 MG tablet Commonly known as: DECADRON Take 1 tablet (6 mg total) by mouth daily.   famotidine 20 MG tablet Commonly known as: Pepcid Take 1 tablet (20 mg total) by mouth 2 (two) times daily.   Flovent HFA 220 MCG/ACT inhaler Generic drug:  fluticasone Inhale 2 puffs into the lungs twice daily   fluticasone 50 MCG/ACT nasal spray Commonly known as: FLONASE Place 1 spray into both nostrils daily. What changed:   when to take this  reasons to take this   gabapentin 100 MG capsule Commonly known as: NEURONTIN Take 200 mg by mouth at bedtime.   hydrOXYzine 25 MG tablet Commonly known as: ATARAX/VISTARIL Take 1-2 tablets (25-50 mg total) by mouth at bedtime as needed. What changed: reasons to take this   ibuprofen 800 MG tablet Commonly known as: ADVIL Take 800-1,600 mg by mouth every 8 (eight) hours as needed for headache, mild pain or moderate pain (migraine).   levocetirizine 5 MG tablet Commonly known as: XYZAL Take 1 tablet (5 mg total) by mouth 2 (two) times daily. What changed:   when to take this  reasons to take this   levothyroxine 150 MCG tablet Commonly known as: SYNTHROID TAKE 1 TABLET BY MOUTH EVERY DAY What changed:   medication strength  when to take this   loperamide 2 MG capsule Commonly known as: IMODIUM Take 1 capsule (2 mg total) by mouth daily as needed for diarrhea or loose stools.   metoprolol succinate 25 MG 24 hr tablet Commonly known as: TOPROL-XL Take 1 tablet (25 mg total) by mouth daily. Take with or immediately following a meal. What changed:   medication strength  See the new instructions.   montelukast 10 MG tablet Commonly known as: SINGULAIR Take 1 tablet (10 mg total) by mouth at bedtime.   ondansetron 8 MG disintegrating tablet Commonly known as: ZOFRAN-ODT Take 1 tablet (8 mg total) by mouth every 8 (eight) hours as needed for nausea or vomiting.   rizatriptan 10 MG tablet Commonly known as: MAXALT Take 10 mg by mouth daily as needed for migraine.   testosterone cypionate 100 MG/ML injection Commonly known as: DEPOTESTOTERONE CYPIONATE 40 mg every 10 days, and syringes 1 every 10 days What changed:   how much to take  how to take this  when to  take this  additional instructions   traZODone 100 MG tablet Commonly known as: DESYREL Take 100 mg by mouth at bedtime as needed for sleep.   triamcinolone  cream 0.1 % Commonly known as: KENALOG Apply 1 application topically 4 (four) times daily. As needed for itching What changed:   when to take this  reasons to take this  additional instructions      Follow-up Information    Fleet Contras, MD. Schedule an appointment as soon as possible for a visit in 1 week(s).   Specialty: Internal Medicine Contact information: [address] [place] [address] [phone]          Allergies  Allergen Reactions  . Frovatriptan Other (See Comments)    Numbness in both legs  . Sulfa Antibiotics Other (See Comments)    Makes patients legs numb     Consultations:   None   Other Procedures/Studies: DG Chest Portable 1 View  Result Date: 02/09/2020 CLINICAL DATA:  39 year old male with shortness of breath and cough. Positive COVID-19. EXAM: PORTABLE CHEST 1 VIEW COMPARISON:  Chest radiograph dated 02/15/2016. FINDINGS: Bilateral mid to lower lung field confluent and streaky densities most consistent with multifocal pneumonia and in keeping with known COVID-19. There is no pleural effusion or pneumothorax. The cardiac silhouette is within normal limits. No acute osseous pathology. IMPRESSION: Multifocal pneumonia. Electronically Signed   By: Elgie Collard M.D.   On: 02/09/2020 23:35     TODAY-DAY OF DISCHARGE:  Subjective:   Anthony Rowe today has no headache,no chest abdominal pain,no new weakness tingling or numbness, feels much better wants to go home today.   Objective:   Blood pressure 100/67, pulse 61, temperature 97.7 F (36.5 C), temperature source Oral, resp. rate 20, height 5\' 3"  (1.6 m), weight 85 kg, SpO2 98 %.  Intake/Output Summary (Last 24 hours) at 02/14/2020 1058 Last data filed at 02/14/2020 0501 Gross per 24 hour  Intake 600 ml  Output  1425 ml  Net -825 ml   Filed Weights   02/09/20 2138  Weight: 85 kg    Exam: Awake Alert, Oriented *3, No new F.N deficits, Normal affect Simpson.AT,PERRAL Supple Neck,No JVD, No cervical lymphadenopathy appriciated.  Symmetrical Chest wall movement, Good air movement bilaterally, CTAB RRR,No Gallops,Rubs or new Murmurs, No Parasternal Heave +ve B.Sounds, Abd Soft, Non tender, No organomegaly appriciated, No rebound -guarding or rigidity. No Cyanosis, Clubbing or edema, No new Rash or bruise   PERTINENT RADIOLOGIC STUDIES: DG Chest Portable 1 View  Result Date: 02/09/2020 CLINICAL DATA:  39 year old male with shortness of breath and cough. Positive COVID-19. EXAM: PORTABLE CHEST 1 VIEW COMPARISON:  Chest radiograph dated 02/15/2016. FINDINGS: Bilateral mid to lower lung field confluent and streaky densities most consistent with multifocal pneumonia and in keeping with known COVID-19. There is no pleural effusion or pneumothorax. The cardiac silhouette is within normal limits. No acute osseous pathology. IMPRESSION: Multifocal pneumonia. Electronically Signed   By: 02/17/2016 M.D.   On: 02/09/2020 23:35     PERTINENT LAB RESULTS: CBC: Recent Labs    02/13/20 0324 02/14/20 0226  WBC 6.4 7.1  HGB 14.2 15.6  HCT 42.2 45.2  PLT 479* 551*   CMET CMP     Component Value Date/Time   NA 137 02/14/2020 0226   K 4.3 02/14/2020 0226   CL 102 02/14/2020 0226   CO2 23 02/14/2020 0226   GLUCOSE 134 (H) 02/14/2020 0226   BUN 18 02/14/2020 0226   CREATININE 1.39 (H) 02/14/2020 0226   CREATININE 1.12 09/25/2013 1245   CALCIUM 9.2 02/14/2020 0226   PROT 6.8 02/14/2020 0226   ALBUMIN 3.4 (L) 02/14/2020 02/16/2020  AST 20 02/14/2020 0226   ALT 28 02/14/2020 0226   ALKPHOS 48 02/14/2020 0226   BILITOT 1.1 02/14/2020 0226   GFRNONAA >60 02/14/2020 0226   GFRAA >60 02/14/2020 0226    GFR Estimated Creatinine Clearance (by C-G formula based on SCr of 1.39 mg/dL (H)) Male: 56.1 mL/min  (A) Male: 68.7 mL/min (A) No results for input(s): LIPASE, AMYLASE in the last 72 hours. No results for input(s): CKTOTAL, CKMB, CKMBINDEX, TROPONINI in the last 72 hours. Invalid input(s): POCBNP Recent Labs    02/13/20 0324 02/14/20 0226  DDIMER 0.45 <0.27   No results for input(s): HGBA1C in the last 72 hours. No results for input(s): CHOL, HDL, LDLCALC, TRIG, CHOLHDL, LDLDIRECT in the last 72 hours. No results for input(s): TSH, T4TOTAL, T3FREE, THYROIDAB in the last 72 hours.  Invalid input(s): FREET3 Recent Labs    02/13/20 0324 02/14/20 0226  FERRITIN 534* 689*   Coags: No results for input(s): INR in the last 72 hours.  Invalid input(s): PT Microbiology: No results found for this or any previous visit (from the past 240 hour(s)).  FURTHER DISCHARGE INSTRUCTIONS:  Get Medicines reviewed and adjusted: Please take all your medications with you for your next visit with your Primary MD  Laboratory/radiological data: Please request your Primary MD to go over all hospital tests and procedure/radiological results at the follow up, please ask your Primary MD to get all Hospital records sent to his/her office.  In some cases, they will be blood work, cultures and biopsy results pending at the time of your discharge. Please request that your primary care M.D. goes through all the records of your hospital data and follows up on these results.  Also Note the following: If you experience worsening of your admission symptoms, develop shortness of breath, life threatening emergency, suicidal or homicidal thoughts you must seek medical attention immediately by calling 911 or calling your MD immediately  if symptoms less severe.  You must read complete instructions/literature along with all the possible adverse reactions/side effects for all the Medicines you take and that have been prescribed to you. Take any new Medicines after you have completely understood and accpet all the  possible adverse reactions/side effects.   Do not drive when taking Pain medications or sleeping medications (Benzodaizepines)  Do not take more than prescribed Pain, Sleep and Anxiety Medications. It is not advisable to combine anxiety,sleep and pain medications without talking with your primary care practitioner  Special Instructions: If you have smoked or chewed Tobacco  in the last 2 yrs please stop smoking, stop any regular Alcohol  and or any Recreational drug use.  Wear Seat belts while driving.  Please note: You were cared for by a hospitalist during your hospital stay. Once you are discharged, your primary care physician will handle any further medical issues. Please note that NO REFILLS for any discharge medications will be authorized once you are discharged, as it is imperative that you return to your primary care physician (or establish a relationship with a primary care physician if you do not have one) for your post hospital discharge needs so that they can reassess your need for medications and monitor your lab values.  Total Time spent coordinating discharge including counseling, education and face to face time equals 35 minutes.  SignedOren Binet 02/14/2020 10:58 AM

## 2020-02-14 NOTE — Care Management (Signed)
Pt deemed stable for discharge home today. CM reviewed pts chart - no TOC needs identified.  Discharge order signed - CM singing off

## 2020-02-14 NOTE — Discharge Instructions (Signed)
Person Under Monitoring Name: Anthony Rowe  Location: [address] [place] [address]   Infection Prevention Recommendations for Individuals Confirmed to have, or Being Evaluated for, 2019 Novel Coronavirus (COVID-19) Infection Who Receive Care at Home  Individuals who are confirmed to have, or are being evaluated for, COVID-19 should follow the prevention steps below until a healthcare provider or local or state health department says they can return to normal activities.  Stay home except to get medical care You should restrict activities outside your home, except for getting medical care. Do not go to work, school, or public areas, and do not use public transportation or taxis.  Call ahead before visiting your doctor Before your medical appointment, call the healthcare provider and tell them that you have, or are being evaluated for, COVID-19 infection. This will help the healthcare providers office take steps to keep other people from getting infected. Ask your healthcare provider to call the local or state health department.  Monitor your symptoms Seek prompt medical attention if your illness is worsening (e.g., difficulty breathing). Before going to your medical appointment, call the healthcare provider and tell them that you have, or are being evaluated for, COVID-19 infection. Ask your healthcare provider to call the local or state health department.  Wear a facemask You should wear a facemask that covers your nose and mouth when you are in the same room with other people and when you visit a healthcare provider. People who live with or visit you should also wear a facemask while they are in the same room with you.  Separate yourself from other people in your home As much as possible, you should stay in a different room from other people in your home. Also, you should use a separate bathroom, if available.  Avoid sharing household items You should not  share dishes, drinking glasses, cups, eating utensils, towels, bedding, or other items with other people in your home. After using these items, you should wash them thoroughly with soap and water.  Cover your coughs and sneezes Cover your mouth and nose with a tissue when you cough or sneeze, or you can cough or sneeze into your sleeve. Throw used tissues in a lined trash can, and immediately wash your hands with soap and water for at least 20 seconds or use an alcohol-based hand rub.  Wash your [employer] your hands often and thoroughly with soap and water for at least 20 seconds. You can use an alcohol-based hand sanitizer if soap and water are not available and if your hands are not visibly dirty. Avoid touching your eyes, nose, and mouth with unwashed hands.   Prevention Steps for Caregivers and Household Members of Individuals Confirmed to have, or Being Evaluated for, COVID-19 Infection Being Cared for in the Home  If you live with, or provide care at home for, a person confirmed to have, or being evaluated for, COVID-19 infection please follow these guidelines to prevent infection:  Follow healthcare providers instructions Make sure that you understand and can help the patient follow any healthcare provider instructions for all care.  Provide for the patients basic needs You should help the patient with basic needs in the home and provide support for getting groceries, prescriptions, and other personal needs.  Monitor the patients symptoms If they are getting sicker, call his or her medical provider and tell them that the patient has, or is being evaluated for, COVID-19 infection. This will help the healthcare providers office  take steps to keep other people from getting infected. Ask the healthcare provider to call the local or state health department.  Limit the number of people who have contact with the patient  If possible, have only one caregiver for the  patient.  Other household members should stay in another home or place of residence. If this is not possible, they should stay  in another room, or be separated from the patient as much as possible. Use a separate bathroom, if available.  Restrict visitors who do not have an essential need to be in the home.  Keep older adults, very young children, and other sick people away from the patient Keep older adults, very young children, and those who have compromised immune systems or chronic health conditions away from the patient. This includes people with chronic heart, lung, or kidney conditions, diabetes, and cancer.  Ensure good ventilation Make sure that shared spaces in the home have good air flow, such as from an air conditioner or an opened window, weather permitting.  Wash your hands often  Wash your hands often and thoroughly with soap and water for at least 20 seconds. You can use an alcohol based hand sanitizer if soap and water are not available and if your hands are not visibly dirty.  Avoid touching your eyes, nose, and mouth with unwashed hands.  Use disposable paper towels to dry your hands. If not available, use dedicated cloth towels and replace them when they become wet.  Wear a facemask and gloves  Wear a disposable facemask at all times in the room and gloves when you touch or have contact with the patients blood, body fluids, and/or secretions or excretions, such as sweat, saliva, sputum, nasal mucus, vomit, urine, or feces.  Ensure the mask fits over your nose and mouth tightly, and do not touch it during use.  Throw out disposable facemasks and gloves after using them. Do not reuse.  Wash your hands immediately after removing your facemask and gloves.  If your personal clothing becomes contaminated, carefully remove clothing and launder. Wash your hands after handling contaminated clothing.  Place all used disposable facemasks, gloves, and other waste in a lined  container before disposing them with other household waste.  Remove gloves and wash your hands immediately after handling these items.  Do not share dishes, glasses, or other household items with the patient  Avoid sharing household items. You should not share dishes, drinking glasses, cups, eating utensils, towels, bedding, or other items with a patient who is confirmed to have, or being evaluated for, COVID-19 infection.  After the person uses these items, you should wash them thoroughly with soap and water.  Wash laundry thoroughly  Immediately remove and wash clothes or bedding that have blood, body fluids, and/or secretions or excretions, such as sweat, saliva, sputum, nasal mucus, vomit, urine, or feces, on them.  Wear gloves when handling laundry from the patient.  Read and follow directions on labels of laundry or clothing items and detergent. In general, wash and dry with the warmest temperatures recommended on the label.  Clean all areas the individual has used often  Clean all touchable surfaces, such as counters, tabletops, doorknobs, bathroom fixtures, toilets, phones, keyboards, tablets, and bedside tables, every day. Also, clean any surfaces that may have blood, body fluids, and/or secretions or excretions on them.  Wear gloves when cleaning surfaces the patient has come in contact with.  Use a diluted bleach solution (e.g., dilute bleach with 1 part  bleach and 10 parts water) or a household disinfectant with a label that says EPA-registered for coronaviruses. To make a bleach solution at home, add 1 tablespoon of bleach to 1 quart (4 cups) of water. For a larger supply, add  cup of bleach to 1 gallon (16 cups) of water.  Read labels of cleaning products and follow recommendations provided on product labels. Labels contain instructions for safe and effective use of the cleaning product including precautions you should take when applying the product, such as wearing gloves or  eye protection and making sure you have good ventilation during use of the product.  Remove gloves and wash hands immediately after cleaning.  Monitor yourself for signs and symptoms of illness Caregivers and household members are considered close contacts, should monitor their health, and will be asked to limit movement outside of the home to the extent possible. Follow the monitoring steps for close contacts listed on the symptom monitoring form.   ? If you have additional questions, contact your local health department or call the epidemiologist on call at [phone] (available 24/7). ? This guidance is subject to change. For the most up-to-date guidance from [facility], please refer to their website: [email or web address]

## 2020-02-14 NOTE — Plan of Care (Signed)
  Problem: Education: Goal: Knowledge of General Education information will improve Description: Including pain rating scale, medication(s)/side effects and non-pharmacologic comfort measures Outcome: Adequate for Discharge   

## 2020-02-14 NOTE — Progress Notes (Signed)
SATURATION QUALIFICATIONS: (This note is used to comply with regulatory documentation for home oxygen)  Patient Saturations on Room Air at Rest = 96%  Patient Saturations on Room Air while Ambulating = 94%  Ambulated up and down hall maintaining 94% on room air.

## 2020-02-23 ENCOUNTER — Ambulatory Visit: Payer: Medicare Other | Admitting: Endocrinology

## 2020-02-23 ENCOUNTER — Other Ambulatory Visit: Payer: Self-pay | Admitting: Allergy

## 2020-02-23 NOTE — Telephone Encounter (Signed)
COURTESY REFILL OF FLOVENT GIVEN 9/4 AND PT MADE AWARE THAT HE NEEDS TO SCHEDULE APPT. DENIED RX- PT NEEDS OV.

## 2020-03-15 ENCOUNTER — Other Ambulatory Visit: Payer: Self-pay

## 2020-03-15 ENCOUNTER — Encounter: Payer: Self-pay | Admitting: Endocrinology

## 2020-03-15 ENCOUNTER — Ambulatory Visit (INDEPENDENT_AMBULATORY_CARE_PROVIDER_SITE_OTHER): Payer: Medicare Other | Admitting: Endocrinology

## 2020-03-15 VITALS — BP 120/80 | HR 85 | Ht 63.0 in | Wt 183.0 lb

## 2020-03-15 DIAGNOSIS — E890 Postprocedural hypothyroidism: Secondary | ICD-10-CM | POA: Diagnosis not present

## 2020-03-15 NOTE — Patient Instructions (Addendum)
It is best to never miss the levothyroxine.  However, if you do miss it, next best is to double up the next time.   Please redo the blood tests in approx 4 weeks.   Please come back for a follow-up appointment in 3 months.

## 2020-03-15 NOTE — Progress Notes (Signed)
Subjective:    Patient ID: Anthony Rowe, adult    DOB: 1981/08/07, 39 y.o.   MRN: [id number]  HPI Pt has transgender state (F to M): Surgery: breast reduction (2017) Medication: testosterone since 2015 Counseling: he last went in 2014 Other: ins declined elagolix; testosterone dosage is limited by polycythemia.  Interval hx: he has run out of testosterone.  Pt also has hyperthyroidism (dx'ed 2019; tapazole was chosen as initial rx, due to severity; in 06/2019, pt had RAI rx; in 11/20, he started synthroid for hypothyroidism).   pt states he feels well in general.  He takes synthroid as rx'ed.  He was recently in the hospital, with coronavirus.  Since then, pt states he feels better in general.  Pt says he sometimes forgets the synthroid.   Past Medical History:  Diagnosis Date  . Asthma   . Bronchitis   . Endometriosis   . Hypertension   . Migraine   . Thyroid disease    hyperthyroidism per pt report    Past Surgical History:  Procedure Laterality Date  . ANKLE SURGERY    . MASTECTOMY Bilateral 2017   male to male     Social History   Socioeconomic History  . Marital status: Married    Spouse name: Not on file  . Number of children: Not on file  . Years of education: Not on file  . Highest education level: Not on file  Occupational History  . Not on file  Tobacco Use  . Smoking status: Never Smoker  . Smokeless tobacco: Never Used  Substance and Sexual Activity  . Alcohol use: No  . Drug use: No  . Sexual activity: Not Currently  Other Topics Concern  . Not on file  Social History Narrative  . Not on file   Social Determinants of Health   Financial Resource Strain:   . Difficulty of Paying Living Expenses:   Food Insecurity:   . Worried About Charity fundraiser in the Last Year:   . Arboriculturist in the Last Year:   Transportation Needs:   . Film/video editor (Medical):   Marland Kitchen Lack of Transportation (Non-Medical):   Physical Activity:   . Days  of Exercise per Week:   . Minutes of Exercise per Session:   Stress:   . Feeling of Stress :   Social Connections:   . Frequency of Communication with Friends and Family:   . Frequency of Social Gatherings with Friends and Family:   . Attends Religious Services:   . Active Member of Clubs or Organizations:   . Attends Archivist Meetings:   Marland Kitchen Marital Status:   Intimate Partner Violence:   . Fear of Current or Ex-Partner:   . Emotionally Abused:   Marland Kitchen Physically Abused:   . Sexually Abused:     Current Outpatient Medications on File Prior to Visit  Medication Sig Dispense Refill  . albuterol (VENTOLIN HFA) 108 (90 Base) MCG/ACT inhaler Inhale 2 puffs every 4-6 hours as needed for cough, wheeze, shortness of breath or chest tightness. 18 g 0  . benzonatate (TESSALON PERLES) 100 MG capsule Take 1 capsule (100 mg total) by mouth every 6 (six) hours as needed for cough. 30 capsule 0  . buPROPion (WELLBUTRIN XL) 150 MG 24 hr tablet Take 150 mg by mouth daily.    . busPIRone (BUSPAR) 10 MG tablet Take 10 mg by mouth 2 (two) times daily.    . cyclobenzaprine (FLEXERIL) 10  MG tablet Take 10 mg by mouth 2 (two) times daily as needed.     Marland Kitchen dexamethasone (DECADRON) 6 MG tablet Take 1 tablet (6 mg total) by mouth daily. 3 tablet 0  . famotidine (PEPCID) 20 MG tablet Take 1 tablet (20 mg total) by mouth 2 (two) times daily. 60 tablet 5  . fluticasone (FLONASE) 50 MCG/ACT nasal spray Place 1 spray into both nostrils daily. (Patient taking differently: Place 1 spray into both nostrils daily as needed for allergies. ) 16 g 0  . fluticasone (FLOVENT HFA) 220 MCG/ACT inhaler Inhale 2 puffs into the lungs twice daily 1 Inhaler 0  . gabapentin (NEURONTIN) 100 MG capsule Take 200 mg by mouth at bedtime.     . hydrOXYzine (ATARAX/VISTARIL) 25 MG tablet Take 1-2 tablets (25-50 mg total) by mouth at bedtime as needed. (Patient taking differently: Take 25-50 mg by mouth at bedtime as needed for anxiety  or vomiting. ) 60 tablet 5  . ibuprofen (ADVIL,MOTRIN) 800 MG tablet Take 800-1,600 mg by mouth every 8 (eight) hours as needed for headache, mild pain or moderate pain (migraine).     Marland Kitchen levocetirizine (XYZAL) 5 MG tablet Take 1 tablet (5 mg total) by mouth 2 (two) times daily. (Patient taking differently: Take 5 mg by mouth daily as needed for allergies. ) 60 tablet 5  . levothyroxine (SYNTHROID) 150 MCG tablet TAKE 1 TABLET BY MOUTH EVERY DAY (Patient taking differently: Take 150 mcg by mouth daily before breakfast. ) 90 tablet 1  . loperamide (IMODIUM) 2 MG capsule Take 1 capsule (2 mg total) by mouth daily as needed for diarrhea or loose stools. 10 capsule 0  . metoprolol succinate (TOPROL-XL) 25 MG 24 hr tablet Take 1 tablet (25 mg total) by mouth daily. Take with or immediately following a meal. 30 tablet 0  . montelukast (SINGULAIR) 10 MG tablet Take 1 tablet (10 mg total) by mouth at bedtime. 30 tablet 5  . ondansetron (ZOFRAN-ODT) 8 MG disintegrating tablet Take 1 tablet (8 mg total) by mouth every 8 (eight) hours as needed for nausea or vomiting. 30 tablet 0  . rizatriptan (MAXALT) 10 MG tablet Take 10 mg by mouth daily as needed for migraine.     Marland Kitchen testosterone cypionate (DEPOTESTOTERONE CYPIONATE) 100 MG/ML injection 40 mg every 10 days, and syringes 1 every 10 days (Patient taking differently: Inject 40 mg into the muscle See admin instructions. Injects 40 mg every 10 days) 10 mL 0  . traZODone (DESYREL) 100 MG tablet Take 100 mg by mouth at bedtime as needed for sleep.     Marland Kitchen triamcinolone cream (KENALOG) 0.1 % Apply 1 application topically 4 (four) times daily. As needed for itching (Patient taking differently: Apply 1 application topically 3 (three) times daily as needed (for rash). ) 45 g 2   No current facility-administered medications on file prior to visit.    Allergies  Allergen Reactions  . Frovatriptan Other (See Comments)    Numbness in both legs  . Sulfa Antibiotics Other  (See Comments)    Makes patients legs numb     Family History  Problem Relation Age of Onset  . Cancer Mother        lung  . Hypertension Maternal Grandmother   . Diabetes Maternal Grandmother   . Thyroid disease Neg Hx     BP 120/80   Pulse 85   Ht 5\' 3"  (1.6 m)   Wt 183 lb (83 kg)   SpO2 99%  BMI 32.42 kg/m    Review of Systems     Objective:   Physical Exam VITAL SIGNS:  See vs page GENERAL: no distress NECK: There is no palpable thyroid enlargement.  No thyroid nodule is palpable.  No palpable lymphadenopathy at the anterior neck.  Lab Results  Component Value Date   TSH 24.877 (H) 02/10/2020       Assessment & Plan:  Hypothyroidism: worse Noncompliance with synthroid I'll work around this as best I can.   Patient Instructions  It is best to never miss the levothyroxine.  However, if you do miss it, next best is to double up the next time.   Please redo the blood tests in approx 4 weeks.   Please come back for a follow-up appointment in 3 months.

## 2020-04-19 ENCOUNTER — Other Ambulatory Visit: Payer: Self-pay

## 2020-04-19 ENCOUNTER — Telehealth: Payer: Self-pay

## 2020-04-19 ENCOUNTER — Other Ambulatory Visit (INDEPENDENT_AMBULATORY_CARE_PROVIDER_SITE_OTHER): Payer: Medicare Other

## 2020-04-19 ENCOUNTER — Other Ambulatory Visit: Payer: Self-pay | Admitting: Endocrinology

## 2020-04-19 DIAGNOSIS — E890 Postprocedural hypothyroidism: Secondary | ICD-10-CM | POA: Diagnosis not present

## 2020-04-19 LAB — T4, FREE: Free T4: 0.22 ng/dL — ABNORMAL LOW (ref 0.60–1.60)

## 2020-04-19 LAB — TSH: TSH: 55.28 u[IU]/mL — ABNORMAL HIGH (ref 0.35–4.50)

## 2020-04-19 MED ORDER — LEVOTHYROXINE SODIUM 175 MCG PO TABS: 175.0000 ug | ORAL_TABLET | Freq: Every day | ORAL | 3 refills | Status: DC

## 2020-04-19 NOTE — Telephone Encounter (Signed)
-----   Message from Romero Belling, MD sent at 04/19/2020  2:32 PM EDT ----- please contact patient: We need to increase the thyroid pill.  I have sent a prescription to your pharmacy.  I'll see you next time.

## 2020-04-19 NOTE — Telephone Encounter (Signed)
RESULTS  Results were reviewed by Dr. Everardo All. Called pt to inform about results as well as new orders. Using closed-loop communication, pt verbalized complete acceptance and understanding of all information provided. No further questions nor concerns were voiced at this time.  Outpatient Medication Detail   Disp Refills Start End   levothyroxine (SYNTHROID) 175 MCG tablet 90 tablet 3 04/19/2020    Sig - Route: Take 1 tablet (175 mcg total) by mouth daily before breakfast. - Oral   Sent to pharmacy as: levothyroxine (SYNTHROID) 175 MCG tablet   E-Prescribing Status: Receipt confirmed by pharmacy (04/19/2020 2:32 PM EDT)

## 2020-04-23 ENCOUNTER — Other Ambulatory Visit: Payer: Self-pay

## 2020-04-23 DIAGNOSIS — Z789 Other specified health status: Secondary | ICD-10-CM

## 2020-04-23 NOTE — Progress Notes (Signed)
Patient called stating that she needs to referred to Bryn Gulling, MD at [facility] minimal invasive surgery in [place], [place].  I called and LM for the office to CB so I can schedule or fax me referral form.

## 2020-04-24 ENCOUNTER — Encounter: Payer: Self-pay | Admitting: Endocrinology

## 2020-05-17 ENCOUNTER — Ambulatory Visit: Payer: Medicare Other | Admitting: Endocrinology

## 2020-05-19 ENCOUNTER — Emergency Department (HOSPITAL_COMMUNITY)
Admission: EM | Admit: 2020-05-19 | Discharge: 2020-05-19 | Disposition: A | Discharge status: Home / Self Care | Payer: Medicare Other | Attending: Emergency Medicine | Admitting: Emergency Medicine

## 2020-05-19 ENCOUNTER — Other Ambulatory Visit: Payer: Self-pay

## 2020-05-19 ENCOUNTER — Emergency Department (HOSPITAL_COMMUNITY): Payer: Medicare Other

## 2020-05-19 ENCOUNTER — Encounter (HOSPITAL_COMMUNITY): Payer: Self-pay | Admitting: Emergency Medicine

## 2020-05-19 DIAGNOSIS — E039 Hypothyroidism, unspecified: Secondary | ICD-10-CM | POA: Diagnosis not present

## 2020-05-19 DIAGNOSIS — Z79890 Hormone replacement therapy (postmenopausal): Secondary | ICD-10-CM | POA: Diagnosis not present

## 2020-05-19 DIAGNOSIS — Z8616 Personal history of COVID-19: Secondary | ICD-10-CM | POA: Diagnosis not present

## 2020-05-19 DIAGNOSIS — I10 Essential (primary) hypertension: Secondary | ICD-10-CM | POA: Insufficient documentation

## 2020-05-19 DIAGNOSIS — R0789 Other chest pain: Secondary | ICD-10-CM

## 2020-05-19 DIAGNOSIS — R079 Chest pain, unspecified: Secondary | ICD-10-CM | POA: Diagnosis present

## 2020-05-19 DIAGNOSIS — Z79899 Other long term (current) drug therapy: Secondary | ICD-10-CM | POA: Diagnosis not present

## 2020-05-19 DIAGNOSIS — J45909 Unspecified asthma, uncomplicated: Secondary | ICD-10-CM | POA: Diagnosis not present

## 2020-05-19 HISTORY — DX: COVID-19: U07.1

## 2020-05-19 LAB — CBC
HCT: 42 % (ref 39.0–52.0)
Hemoglobin: 14.4 g/dL (ref 13.0–17.0)
MCH: 30.8 pg (ref 26.0–34.0)
MCHC: 34.3 g/dL (ref 30.0–36.0)
MCV: 89.7 fL (ref 80.0–100.0)
Platelets: 307 10*3/uL (ref 150–400)
RBC: 4.68 MIL/uL (ref 4.22–5.81)
RDW: 13.7 % (ref 11.5–15.5)
WBC: 6.2 10*3/uL (ref 4.0–10.5)
nRBC: 0 % (ref 0.0–0.2)

## 2020-05-19 LAB — BASIC METABOLIC PANEL
Anion gap: 10 (ref 5–15)
BUN: 12 mg/dL (ref 6–20)
CO2: 23 mmol/L (ref 22–32)
Calcium: 9.3 mg/dL (ref 8.9–10.3)
Chloride: 107 mmol/L (ref 98–111)
Creatinine, Ser: 1.28 mg/dL — ABNORMAL HIGH (ref 0.61–1.24)
GFR calc Af Amer: >60 mL/min (ref >60)
GFR calc non Af Amer: >60 mL/min (ref >60)
Glucose, Bld: 99 mg/dL (ref 70–99)
Potassium: 3.3 mmol/L — ABNORMAL LOW (ref 3.5–5.1)
Sodium: 140 mmol/L (ref 135–145)

## 2020-05-19 LAB — TROPONIN I (HIGH SENSITIVITY)
Troponin I (High Sensitivity): 4 ng/L (ref <18)
Troponin I (High Sensitivity): 4 ng/L (ref <18)

## 2020-05-19 MED ORDER — SODIUM CHLORIDE 0.9% FLUSH: 3.0000 mL | Freq: Once | INTRAVENOUS | Status: DC

## 2020-05-19 MED ORDER — METOPROLOL SUCCINATE ER 25 MG PO TB24: 25.0000 mg | ORAL_TABLET | Freq: Every day | ORAL | 0 refills | Status: DC

## 2020-05-19 MED ORDER — ALBUTEROL SULFATE HFA 108 (90 BASE) MCG/ACT IN AERS: INHALATION_SPRAY | RESPIRATORY_TRACT | 0 refills | Status: DC

## 2020-05-19 NOTE — ED Notes (Signed)
Pt given apple juice, per Greta Doom - PA

## 2020-05-19 NOTE — ED Notes (Signed)
Pt discharge instructions reviewed with the patient. The patient verbalized understanding. Pt discharged. 

## 2020-05-19 NOTE — ED Provider Notes (Signed)
[facility] EMERGENCY DEPARTMENT Provider Note   CSN: [id number] Arrival date & time: 05/19/20  0002     History Chief Complaint  Patient presents with  . Chest Pain    Anthony Rowe is a 39 y.o. adult.  The history is provided by the patient and medical records. No language interpreter was used.     39 year old male with history of asthma, hypertension, thyroid disease, recurrent bronchitis, obesity, male to male transgender presents ED for evaluation of chest pain.  Patient reports she was cleaning her house using chemical including acetones approximately 4 days ago.  She was cleaning in an enclosed space.  After short amount of time she developed pain in her chest, watery eyes, runny nose, and tingling sensation to both of arms with associated nausea.  Most of the symptoms resolved after about 40 minutes but she still has residual chest pain.  She described pain as a sharp sensation worse when she takes a deep breath or when she coughs.  She does have history of asthma and and did try to use inhaler 3-4 times a day.  She did not report any significant wheezing.  No complaints of fever or chills lightheadedness exertional chest pain hemoptysis back pain or arm pain.  She is low on some of her regular medication but does have an appointment with her PCP for medication refill in the next 5 days.  She denies any prior history of PE or DVT, no recent surgery, bedrest, active cancer, or hemoptysis.  She denies tobacco use.    Past Medical History:  Diagnosis Date  . Asthma   . Bronchitis   . COVID-19   . Endometriosis   . Hypertension   . Migraine   . Thyroid disease    hyperthyroidism per pt report    Patient Active Problem List   Diagnosis Date Noted  . Acute respiratory disease due to COVID-19 virus 02/10/2020  . Depression with anxiety 02/10/2020  . Obesity (BMI 30.0-34.9) 02/10/2020  . Polycythemia 12/20/2019  . Hypothyroidism 11/06/2019  . Hypokalemia  05/29/2019  . Migraine 08/12/2018  . HTN (hypertension) 08/12/2018  . Asthma 08/12/2018  . Male-to-male transgender person 08/12/2018  . Gender identity disorder 08/19/2016  . Left ankle pain 09/25/2013  . Hypovitaminosis D 09/25/2013    Past Surgical History:  Procedure Laterality Date  . ANKLE SURGERY    . MASTECTOMY Bilateral 2017   male to male        Family History  Problem Relation Age of Onset  . Cancer Mother        lung  . Hypertension Maternal Grandmother   . Diabetes Maternal Grandmother   . Thyroid disease Neg Hx     Social History   Tobacco Use  . Smoking status: Never Smoker  . Smokeless tobacco: Never Used  Vaping Use  . Vaping Use: Never used  Substance Use Topics  . Alcohol use: No  . Drug use: No    Home Medications Prior to Admission medications   Medication Sig Start Date End Date Taking? Authorizing Provider  albuterol (VENTOLIN HFA) 108 (90 Base) MCG/ACT inhaler Inhale 2 puffs every 4-6 hours as needed for cough, wheeze, shortness of breath or chest tightness. 02/09/20   Marcelyn Bruins, MD  benzonatate (TESSALON PERLES) 100 MG capsule Take 1 capsule (100 mg total) by mouth every 6 (six) hours as needed for cough. 02/14/20 02/13/21  Maretta Bees, MD  buPROPion (WELLBUTRIN XL) 150 MG 24 hr tablet  Take 150 mg by mouth daily. 12/05/19   [provider]  busPIRone (BUSPAR) 10 MG tablet Take 10 mg by mouth 2 (two) times daily. 11/14/19   [provider]  cyclobenzaprine (FLEXERIL) 10 MG tablet Take 10 mg by mouth 2 (two) times daily as needed.  01/25/13   [provider]  dexamethasone (DECADRON) 6 MG tablet Take 1 tablet (6 mg total) by mouth daily. 02/14/20   Ghimire, Werner Lean, MD  famotidine (PEPCID) 20 MG tablet Take 1 tablet (20 mg total) by mouth 2 (two) times daily. 07/26/19   Marcelyn Bruins, MD  fluticasone (FLONASE) 50 MCG/ACT nasal spray Place 1 spray into both nostrils daily. Patient taking  differently: Place 1 spray into both nostrils daily as needed for allergies.  07/14/19   Georgetta Haber, NP  fluticasone (FLOVENT HFA) 220 MCG/ACT inhaler Inhale 2 puffs into the lungs twice daily 02/09/20   Marcelyn Bruins, MD  gabapentin (NEURONTIN) 100 MG capsule Take 200 mg by mouth at bedtime.  01/10/20   [provider]  hydrOXYzine (ATARAX/VISTARIL) 25 MG tablet Take 1-2 tablets (25-50 mg total) by mouth at bedtime as needed. Patient taking differently: Take 25-50 mg by mouth at bedtime as needed for anxiety or vomiting.  07/26/19   Marcelyn Bruins, MD  ibuprofen (ADVIL,MOTRIN) 800 MG tablet Take 800-1,600 mg by mouth every 8 (eight) hours as needed for headache, mild pain or moderate pain (migraine).     [provider]  levocetirizine (XYZAL) 5 MG tablet Take 1 tablet (5 mg total) by mouth 2 (two) times daily. Patient taking differently: Take 5 mg by mouth daily as needed for allergies.  03/30/19   Marcelyn Bruins, MD  levothyroxine (SYNTHROID) 175 MCG tablet Take 1 tablet (175 mcg total) by mouth daily before breakfast. 04/19/20   Romero Belling, MD  loperamide (IMODIUM) 2 MG capsule Take 1 capsule (2 mg total) by mouth daily as needed for diarrhea or loose stools. 07/18/19   Wallis Bamberg, PA-C  metoprolol succinate (TOPROL-XL) 25 MG 24 hr tablet Take 1 tablet (25 mg total) by mouth daily. Take with or immediately following a meal. 02/14/20   Ghimire, Werner Lean, MD  montelukast (SINGULAIR) 10 MG tablet Take 1 tablet (10 mg total) by mouth at bedtime. 07/26/19   Marcelyn Bruins, MD  ondansetron (ZOFRAN-ODT) 8 MG disintegrating tablet Take 1 tablet (8 mg total) by mouth every 8 (eight) hours as needed for nausea or vomiting. 07/18/19   Wallis Bamberg, PA-C  rizatriptan (MAXALT) 10 MG tablet Take 10 mg by mouth daily as needed for migraine.  01/02/20   [provider]  testosterone cypionate (DEPOTESTOTERONE CYPIONATE) 100 MG/ML injection 40 mg  every 10 days, and syringes 1 every 10 days Patient taking differently: Inject 40 mg into the muscle See admin instructions. Injects 40 mg every 10 days 12/22/19   Romero Belling, MD  traZODone (DESYREL) 100 MG tablet Take 100 mg by mouth at bedtime as needed for sleep.     [provider]  triamcinolone cream (KENALOG) 0.1 % Apply 1 application topically 4 (four) times daily. As needed for itching Patient taking differently: Apply 1 application topically 3 (three) times daily as needed (for rash).  07/26/19   Romero Belling, MD    Allergies    Frovatriptan and Sulfa antibiotics  Review of Systems   Review of Systems  All other systems reviewed and are negative.   Physical Exam Updated Vital Signs BP Marland Kitchen)  140/102 (BP Location: Left Arm)   Pulse 60   Resp 18   Ht 5\' 4"  (1.626 m)   Wt 83.9 kg   SpO2 100%   BMI 31.76 kg/m   Physical Exam Vitals and nursing note reviewed.  Constitutional:      General: He is not in acute distress.    Appearance: He is well-developed. He is obese.  HENT:     Head: Atraumatic.     Mouth/Throat:     Mouth: Mucous membranes are moist.  Eyes:     Extraocular Movements: Extraocular movements intact.     Conjunctiva/sclera: Conjunctivae normal.     Pupils: Pupils are equal, round, and reactive to light.  Cardiovascular:     Rate and Rhythm: Normal rate and regular rhythm.     Heart sounds: Normal heart sounds.  Pulmonary:     Effort: Pulmonary effort is normal.     Breath sounds: Normal breath sounds.  Abdominal:     Palpations: Abdomen is soft.     Tenderness: There is no abdominal tenderness.  Musculoskeletal:        General: No swelling.     Cervical back: Neck supple.  Skin:    Findings: No rash.  Neurological:     Mental Status: He is alert. Mental status is at baseline.  Psychiatric:        Mood and Affect: Mood normal.     ED Results / Procedures / Treatments   Labs (all labs ordered are listed, but only abnormal results  are displayed) Labs Reviewed  BASIC METABOLIC PANEL - Abnormal; Notable for the following components:      Result Value   Potassium 3.3 (*)    Creatinine, Ser 1.28 (*)    All other components within normal limits  CBC  TROPONIN I (HIGH SENSITIVITY)  TROPONIN I (HIGH SENSITIVITY)    EKG EKG Interpretation  Date/Time:  Sunday May 19 2020 00:13:25 EDT Ventricular Rate:  71 PR Interval:  148 QRS Duration: 72 QT Interval:  386 QTC Calculation: 419 R Axis:   92 Text Interpretation: Normal sinus rhythm Rightward axis Nonspecific T wave abnormality Abnormal ECG When compared with ECG of EARLIER SAME DATE No significant change was found Confirmed by 09-04-2001 (Dione Booze) on 05/19/2020 12:30:09 AM   Radiology DG Chest 2 View  Result Date: 05/19/2020 CLINICAL DATA:  Chest pain. EXAM: CHEST - 2 VIEW COMPARISON:  February 09, 2020 FINDINGS: Mild, diffuse chronic appearing increased lung markings are seen. There is no evidence of acute infiltrate, pleural effusion or pneumothorax. The heart size and mediastinal contours are within normal limits. The visualized skeletal structures are unremarkable. IMPRESSION: No active cardiopulmonary disease. Electronically Signed   By: February 11, 2020 M.D.   On: 05/19/2020 01:21    Procedures Procedures (including critical care time)  Medications Ordered in ED Medications  sodium chloride flush (NS) 0.9 % injection 3 mL (has no administration in time range)    ED Course  I have reviewed the triage vital signs and the nursing notes.  Pertinent labs & imaging results that were available during my care of the patient were reviewed by me and considered in my medical decision making (see chart for details).    MDM Rules/Calculators/A&P                          BP (!) 140/102 (BP Location: Left Arm)   Pulse 60   Resp 18   Ht  5\' 4"  (1.626 m)   Wt 83.9 kg   SpO2 100%   BMI 31.76 kg/m   Final Clinical Impression(s) / ED Diagnoses Final diagnoses:    Atypical chest pain    Rx / DC Orders ED Discharge Orders    None     8:22 AM Patient developed chest discomfort after exposed to chemical while cleaning house.  Chemical including acetone.  Incident happened several days prior but he still has some residual chest discomfort.  His chest pain is atypical of ACS.  He has no significant risk concerning for PE.  Chest x-ray unremarkable, negative delta troponin, EKG without acute ischemic changes.  At this time no acute emergent medical condition identified and patient is stable for discharge.  Return precaution discussed.   Fayrene Helperran, Joyell Emami, PA-C 05/19/20 [id number]    Rolan BuccoBelfi, Melanie, MD 05/19/20 [phone]

## 2020-05-19 NOTE — ED Notes (Signed)
Informed Bowie - PA of pt's BP

## 2020-05-19 NOTE — ED Triage Notes (Signed)
Pt to ED with c/o mid upper chest pain x's 3 days.  Pt also c/o nonproductive cough.  Pt st's coughing makes pain worse

## 2020-05-19 NOTE — Discharge Instructions (Signed)
Your chest discomfort is likely secondary to recent chemical exposure.  This will improve over time.  Take Tylenol or ibuprofen as needed for discomfort.  Follow-up with your doctor for medication refill.  Return to the ER if you have any concern.

## 2020-05-22 ENCOUNTER — Other Ambulatory Visit: Payer: Self-pay | Admitting: Endocrinology

## 2020-06-21 ENCOUNTER — Ambulatory Visit (INDEPENDENT_AMBULATORY_CARE_PROVIDER_SITE_OTHER): Payer: Medicare Other | Admitting: Endocrinology

## 2020-06-21 ENCOUNTER — Other Ambulatory Visit: Payer: Self-pay

## 2020-06-21 ENCOUNTER — Encounter: Payer: Self-pay | Admitting: Endocrinology

## 2020-06-21 VITALS — BP 118/82 | HR 86 | Ht 64.0 in | Wt 189.6 lb

## 2020-06-21 DIAGNOSIS — E890 Postprocedural hypothyroidism: Secondary | ICD-10-CM | POA: Diagnosis not present

## 2020-06-21 DIAGNOSIS — D751 Secondary polycythemia: Secondary | ICD-10-CM | POA: Diagnosis not present

## 2020-06-21 DIAGNOSIS — Z789 Other specified health status: Secondary | ICD-10-CM

## 2020-06-21 DIAGNOSIS — F640 Transsexualism: Secondary | ICD-10-CM

## 2020-06-21 NOTE — Progress Notes (Signed)
Subjective:    Patient ID: Anthony Rowe, adult    DOB: 24-Aug-1981, 39 y.o.   MRN: [id number]  HPI Pt has transgender state (F to M): Surgery: breast reduction (2017) Medication: testosterone since 2015 Counseling: he last went in 2014 Other: ins declined elagolix; testosterone dosage is limited by polycythemia.  Interval hx: he is back on testosterone.  He takes 40 mg every 10 days.   Pt also has post-RAI hypothyroidism (dx'ed 2019; tapazole was chosen as initial rx, due to severity; in 06/2019, pt had RAI rx; in 11/20, he started synthroid for hypothyroidism).   pt states he feels well in general.  He takes synthroid as rx'ed.  Since then, pt states he feels better in general.  Pt says he still sometimes forgets the synthroid.   Past Medical History:  Diagnosis Date  . Asthma   . Bronchitis   . COVID-19   . Endometriosis   . Hypertension   . Migraine   . Thyroid disease    hyperthyroidism per pt report    Past Surgical History:  Procedure Laterality Date  . ANKLE SURGERY    . MASTECTOMY Bilateral 2017   male to male     Social History   Socioeconomic History  . Marital status: Married    Spouse name: Not on file  . Number of children: Not on file  . Years of education: Not on file  . Highest education level: Not on file  Occupational History  . Not on file  Tobacco Use  . Smoking status: Never Smoker  . Smokeless tobacco: Never Used  Vaping Use  . Vaping Use: Never used  Substance and Sexual Activity  . Alcohol use: No  . Drug use: No  . Sexual activity: Not Currently  Other Topics Concern  . Not on file  Social History Narrative  . Not on file   Social Determinants of Health   Financial Resource Strain:   . Difficulty of Paying Living Expenses: Not on file  Food Insecurity:   . Worried About Programme researcher, broadcasting/film/video in the Last Year: Not on file  . Ran Out of Food in the Last Year: Not on file  Transportation Needs:   . Lack of Transportation  (Medical): Not on file  . Lack of Transportation (Non-Medical): Not on file  Physical Activity:   . Days of Exercise per Week: Not on file  . Minutes of Exercise per Session: Not on file  Stress:   . Feeling of Stress : Not on file  Social Connections:   . Frequency of Communication with Friends and Family: Not on file  . Frequency of Social Gatherings with Friends and Family: Not on file  . Attends Religious Services: Not on file  . Active Member of Clubs or Organizations: Not on file  . Attends Banker Meetings: Not on file  . Marital Status: Not on file  Intimate Partner Violence:   . Fear of Current or Ex-Partner: Not on file  . Emotionally Abused: Not on file  . Physically Abused: Not on file  . Sexually Abused: Not on file    Current Outpatient Medications on File Prior to Visit  Medication Sig Dispense Refill  . albuterol (VENTOLIN HFA) 108 (90 Base) MCG/ACT inhaler Inhale 2 puffs every 4-6 hours as needed for cough, wheeze, shortness of breath or chest tightness. 18 g 0  . benzonatate (TESSALON PERLES) 100 MG capsule Take 1 capsule (100 mg total) by mouth every 6 (  six) hours as needed for cough. 30 capsule 0  . buPROPion (WELLBUTRIN XL) 150 MG 24 hr tablet Take 150 mg by mouth daily.    . busPIRone (BUSPAR) 10 MG tablet Take 10 mg by mouth 2 (two) times daily.    . cyclobenzaprine (FLEXERIL) 10 MG tablet Take 10 mg by mouth 2 (two) times daily as needed.     Marland Kitchen dexamethasone (DECADRON) 6 MG tablet Take 1 tablet (6 mg total) by mouth daily. 3 tablet 0  . famotidine (PEPCID) 20 MG tablet Take 1 tablet (20 mg total) by mouth 2 (two) times daily. 60 tablet 5  . fluticasone (FLONASE) 50 MCG/ACT nasal spray Place 1 spray into both nostrils daily. (Patient taking differently: Place 1 spray into both nostrils daily as needed for allergies. ) 16 g 0  . fluticasone (FLOVENT HFA) 220 MCG/ACT inhaler Inhale 2 puffs into the lungs twice daily 1 Inhaler 0  . gabapentin  (NEURONTIN) 100 MG capsule Take 200 mg by mouth at bedtime.     . hydrOXYzine (ATARAX/VISTARIL) 25 MG tablet Take 1-2 tablets (25-50 mg total) by mouth at bedtime as needed. (Patient taking differently: Take 25-50 mg by mouth at bedtime as needed for anxiety or vomiting. ) 60 tablet 5  . ibuprofen (ADVIL,MOTRIN) 800 MG tablet Take 800-1,600 mg by mouth every 8 (eight) hours as needed for headache, mild pain or moderate pain (migraine).     Marland Kitchen levocetirizine (XYZAL) 5 MG tablet Take 1 tablet (5 mg total) by mouth 2 (two) times daily. (Patient taking differently: Take 5 mg by mouth daily as needed for allergies. ) 60 tablet 5  . levothyroxine (SYNTHROID) 175 MCG tablet Take 1 tablet (175 mcg total) by mouth daily before breakfast. 90 tablet 3  . loperamide (IMODIUM) 2 MG capsule Take 1 capsule (2 mg total) by mouth daily as needed for diarrhea or loose stools. 10 capsule 0  . metoprolol succinate (TOPROL-XL) 25 MG 24 hr tablet Take 1 tablet (25 mg total) by mouth daily. Take with or immediately following a meal. 30 tablet 0  . montelukast (SINGULAIR) 10 MG tablet Take 1 tablet (10 mg total) by mouth at bedtime. 30 tablet 5  . ondansetron (ZOFRAN-ODT) 8 MG disintegrating tablet Take 1 tablet (8 mg total) by mouth every 8 (eight) hours as needed for nausea or vomiting. 30 tablet 0  . rizatriptan (MAXALT) 10 MG tablet Take 10 mg by mouth daily as needed for migraine.     Marland Kitchen testosterone cypionate (DEPOTESTOTERONE CYPIONATE) 100 MG/ML injection Inject 0.4 mLs (40 mg total) into the muscle See admin instructions. Injects 40 mg every 10 days 10 mL 0  . traZODone (DESYREL) 100 MG tablet Take 100 mg by mouth at bedtime as needed for sleep.     Marland Kitchen triamcinolone cream (KENALOG) 0.1 % Apply 1 application topically 4 (four) times daily. As needed for itching (Patient taking differently: Apply 1 application topically 3 (three) times daily as needed (for rash). ) 45 g 2   No current facility-administered medications on  file prior to visit.    Allergies  Allergen Reactions  . Frovatriptan Other (See Comments)    Numbness in both legs  . Sulfa Antibiotics Other (See Comments)    Makes patients legs numb     Family History  Problem Relation Age of Onset  . Cancer Mother        lung  . Hypertension Maternal Grandmother   . Diabetes Maternal Grandmother   . Thyroid  disease Neg Hx     BP 118/82   Pulse 86   Ht 5\' 4"  (1.626 m)   Wt 189 lb 9.6 oz (86 kg)   SpO2 98%   BMI 32.54 kg/m    Review of Systems     Objective:   Physical Exam VITAL SIGNS:  See vs page GENERAL: no distress NECK: There is no palpable thyroid enlargement.  No thyroid nodule is palpable.  No palpable lymphadenopathy at the anterior neck.       Assessment & Plan:  Hypothyroidism: uncontrolled, due to missing medication.   Transgender state: due for recheck.    Patient Instructions  Blood tests are requested for you. Please do in 1 month.  We'll let you know about the results.   It is best to never miss the levothyroxine pill.  However, if you do miss it, next best is to double up the next time.   Please come back for a follow-up appointment in 6 months.

## 2020-06-21 NOTE — Patient Instructions (Addendum)
Blood tests are requested for you. Please do in 1 month.  We'll let you know about the results.   It is best to never miss the levothyroxine pill.  However, if you do miss it, next best is to double up the next time.   Please come back for a follow-up appointment in 6 months.

## 2020-08-23 ENCOUNTER — Other Ambulatory Visit: Payer: Self-pay

## 2020-08-23 ENCOUNTER — Other Ambulatory Visit (INDEPENDENT_AMBULATORY_CARE_PROVIDER_SITE_OTHER): Payer: Medicare Other

## 2020-08-23 ENCOUNTER — Other Ambulatory Visit: Payer: Medicare Other

## 2020-08-23 DIAGNOSIS — Z789 Other specified health status: Secondary | ICD-10-CM

## 2020-08-23 DIAGNOSIS — D751 Secondary polycythemia: Secondary | ICD-10-CM | POA: Diagnosis not present

## 2020-08-23 DIAGNOSIS — E890 Postprocedural hypothyroidism: Secondary | ICD-10-CM | POA: Diagnosis not present

## 2020-08-23 LAB — CBC WITH DIFFERENTIAL/PLATELET
Basophils Absolute: 0 10*3/uL (ref 0.0–0.1)
Basophils Relative: 0.7 % (ref 0.0–3.0)
Eosinophils Absolute: 0.1 10*3/uL (ref 0.0–0.7)
Eosinophils Relative: 2.3 % (ref 0.0–5.0)
HCT: 41.9 % (ref 39.0–52.0)
Hemoglobin: 14 g/dL (ref 13.0–17.0)
Lymphocytes Relative: 25.3 % (ref 12.0–46.0)
Lymphs Abs: 1.1 10*3/uL (ref 0.7–4.0)
MCHC: 33.5 g/dL (ref 30.0–36.0)
MCV: 91.3 fl (ref 78.0–100.0)
Monocytes Absolute: 0.3 10*3/uL (ref 0.1–1.0)
Monocytes Relative: 8.2 % (ref 3.0–12.0)
Neutro Abs: 2.7 10*3/uL (ref 1.4–7.7)
Neutrophils Relative %: 63.5 % (ref 43.0–77.0)
Platelets: 267 10*3/uL (ref 150.0–400.0)
RBC: 4.59 Mil/uL (ref 4.22–5.81)
RDW: 13.8 % (ref 11.5–15.5)
WBC: 4.2 10*3/uL (ref 4.0–10.5)

## 2020-08-23 LAB — TSH: TSH: 20.24 u[IU]/mL — ABNORMAL HIGH (ref 0.35–4.50)

## 2020-08-23 LAB — T4, FREE: Free T4: 0.68 ng/dL (ref 0.60–1.60)

## 2020-08-26 LAB — TESTOSTERONE,FREE AND TOTAL
Testosterone, Free: 2 pg/mL — ABNORMAL LOW (ref 8.7–25.1)
Testosterone: 7 ng/dL — ABNORMAL LOW (ref 264–916)

## 2020-08-27 ENCOUNTER — Telehealth: Payer: Self-pay

## 2020-08-27 NOTE — Telephone Encounter (Signed)
That is OK

## 2020-08-27 NOTE — Telephone Encounter (Signed)
Patient aware of results.  Patient is now being follow by a different provider for thyroid.  Patient only wants to be follow at this office for male to male transgender.

## 2020-08-27 NOTE — Telephone Encounter (Signed)
-----   Message from Romero Belling, MD sent at 08/26/2020  4:54 PM EDT ----- please contact patient: Thyroid and testosterone are low.  I have sent a prescription to your pharmacy, to increase the levothyroxine.  Please take the testosterone shots as rx'ed.

## 2020-10-22 ENCOUNTER — Other Ambulatory Visit: Payer: Self-pay | Admitting: Endocrinology

## 2020-10-23 ENCOUNTER — Other Ambulatory Visit: Payer: Self-pay | Admitting: Endocrinology

## 2020-12-25 ENCOUNTER — Other Ambulatory Visit: Payer: Self-pay

## 2020-12-27 ENCOUNTER — Ambulatory Visit (INDEPENDENT_AMBULATORY_CARE_PROVIDER_SITE_OTHER): Payer: Medicare Other | Admitting: Endocrinology

## 2020-12-27 ENCOUNTER — Other Ambulatory Visit: Payer: Self-pay

## 2020-12-27 VITALS — BP 120/72 | HR 101 | Ht 63.0 in | Wt 192.2 lb

## 2020-12-27 DIAGNOSIS — F649 Gender identity disorder, unspecified: Secondary | ICD-10-CM

## 2020-12-27 DIAGNOSIS — E890 Postprocedural hypothyroidism: Secondary | ICD-10-CM

## 2020-12-27 DIAGNOSIS — D751 Secondary polycythemia: Secondary | ICD-10-CM

## 2020-12-27 LAB — CBC WITH DIFFERENTIAL/PLATELET
Basophils Absolute: 0.1 10*3/uL (ref 0.0–0.1)
Basophils Relative: 1.3 % (ref 0.0–3.0)
Eosinophils Absolute: 0.1 10*3/uL (ref 0.0–0.7)
Eosinophils Relative: 1.1 % (ref 0.0–5.0)
HCT: 43.2 % (ref 39.0–52.0)
Hemoglobin: 14.5 g/dL (ref 13.0–17.0)
Lymphocytes Relative: 25.2 % (ref 12.0–46.0)
Lymphs Abs: 1.1 10*3/uL (ref 0.7–4.0)
MCHC: 33.6 g/dL (ref 30.0–36.0)
MCV: 86.2 fl (ref 78.0–100.0)
Monocytes Absolute: 0.2 10*3/uL (ref 0.1–1.0)
Monocytes Relative: 5.3 % (ref 3.0–12.0)
Neutro Abs: 3 10*3/uL (ref 1.4–7.7)
Neutrophils Relative %: 67.1 % (ref 43.0–77.0)
Platelets: 308 10*3/uL (ref 150.0–400.0)
RBC: 5.01 Mil/uL (ref 4.22–5.81)
RDW: 14 % (ref 11.5–15.5)
WBC: 4.5 10*3/uL (ref 4.0–10.5)

## 2020-12-27 LAB — T4, FREE: Free T4: 0.49 ng/dL — ABNORMAL LOW (ref 0.60–1.60)

## 2020-12-27 LAB — TSH: TSH: 2.91 u[IU]/mL (ref 0.35–4.50)

## 2020-12-27 MED ORDER — TESTOSTERONE CYPIONATE 100 MG/ML IM SOLN: INTRAMUSCULAR | 0 refills | Status: DC

## 2020-12-27 NOTE — Progress Notes (Signed)
Subjective:    Patient ID: Anthony Rowe, adult    DOB: 04-Jul-1981, 40 y.o.   MRN: [id number]  HPI Pt has transgender state (F to M): Surgery: breast reduction (2017) and TAH/BSO (2022) Medication: testosterone since 2015 Counseling: he last went in 2014 Other: ins declined elagolix; testosterone dosage is limited by polycythemia.  Interval hx: He takes 40 mg every 10 days.  He ran out 1 month ago.   Pt also has post-RAI hypothyroidism (dx'ed 2019; tapazole was chosen as initial rx, due to severity; in 06/2019, pt had RAI rx; in 11/20, he started synthroid for hypothyroidism).   pt states he feels well in general.  Pt says he never misses the synthroid.   He was advised he needed to lose weight prior to genital surgery.   Past Medical History:  Diagnosis Date  . Asthma   . Bronchitis   . COVID-19   . Endometriosis   . Hypertension   . Migraine   . Thyroid disease    hyperthyroidism per pt report    Past Surgical History:  Procedure Laterality Date  . ANKLE SURGERY    . MASTECTOMY Bilateral 2017   male to male     Social History   Socioeconomic History  . Marital status: Married    Spouse name: Not on file  . Number of children: Not on file  . Years of education: Not on file  . Highest education level: Not on file  Occupational History  . Not on file  Tobacco Use  . Smoking status: Never Smoker  . Smokeless tobacco: Never Used  Vaping Use  . Vaping Use: Never used  Substance and Sexual Activity  . Alcohol use: No  . Drug use: No  . Sexual activity: Not Currently  Other Topics Concern  . Not on file  Social History Narrative  . Not on file   Social Determinants of Health   Financial Resource Strain: Not on file  Food Insecurity: Not on file  Transportation Needs: Not on file  Physical Activity: Not on file  Stress: Not on file  Social Connections: Not on file  Intimate Partner Violence: Not on file    Current Outpatient Medications on File Prior  to Visit  Medication Sig Dispense Refill  . albuterol (VENTOLIN HFA) 108 (90 Base) MCG/ACT inhaler Inhale 2 puffs every 4-6 hours as needed for cough, wheeze, shortness of breath or chest tightness. 18 g 0  . benzonatate (TESSALON PERLES) 100 MG capsule Take 1 capsule (100 mg total) by mouth every 6 (six) hours as needed for cough. 30 capsule 0  . buPROPion (WELLBUTRIN XL) 150 MG 24 hr tablet Take 150 mg by mouth daily.    . busPIRone (BUSPAR) 10 MG tablet Take 10 mg by mouth 2 (two) times daily.    . cyclobenzaprine (FLEXERIL) 10 MG tablet Take 10 mg by mouth 2 (two) times daily as needed.     Marland Kitchen dexamethasone (DECADRON) 6 MG tablet Take 1 tablet (6 mg total) by mouth daily. 3 tablet 0  . famotidine (PEPCID) 20 MG tablet Take 1 tablet (20 mg total) by mouth 2 (two) times daily. 60 tablet 5  . fluticasone (FLONASE) 50 MCG/ACT nasal spray Place 1 spray into both nostrils daily. (Patient taking differently: Place 1 spray into both nostrils daily as needed for allergies.) 16 g 0  . fluticasone (FLOVENT HFA) 220 MCG/ACT inhaler Inhale 2 puffs into the lungs twice daily 1 Inhaler 0  . gabapentin (NEURONTIN)  100 MG capsule Take 200 mg by mouth at bedtime.     . hydrOXYzine (ATARAX/VISTARIL) 25 MG tablet Take 1-2 tablets (25-50 mg total) by mouth at bedtime as needed. (Patient taking differently: Take 25-50 mg by mouth at bedtime as needed for anxiety or vomiting.) 60 tablet 5  . ibuprofen (ADVIL,MOTRIN) 800 MG tablet Take 800-1,600 mg by mouth every 8 (eight) hours as needed for headache, mild pain or moderate pain (migraine).     Marland Kitchen levocetirizine (XYZAL) 5 MG tablet Take 1 tablet (5 mg total) by mouth 2 (two) times daily. (Patient taking differently: Take 5 mg by mouth daily as needed for allergies.) 60 tablet 5  . levothyroxine (SYNTHROID) 175 MCG tablet Take 1 tablet (175 mcg total) by mouth daily before breakfast. 90 tablet 3  . loperamide (IMODIUM) 2 MG capsule Take 1 capsule (2 mg total) by mouth  daily as needed for diarrhea or loose stools. 10 capsule 0  . metoprolol succinate (TOPROL-XL) 25 MG 24 hr tablet Take 1 tablet (25 mg total) by mouth daily. Take with or immediately following a meal. 30 tablet 0  . montelukast (SINGULAIR) 10 MG tablet Take 1 tablet (10 mg total) by mouth at bedtime. 30 tablet 5  . ondansetron (ZOFRAN-ODT) 8 MG disintegrating tablet Take 1 tablet (8 mg total) by mouth every 8 (eight) hours as needed for nausea or vomiting. 30 tablet 0  . rizatriptan (MAXALT) 10 MG tablet Take 10 mg by mouth daily as needed for migraine.     . traZODone (DESYREL) 100 MG tablet Take 100 mg by mouth at bedtime as needed for sleep.     Marland Kitchen triamcinolone cream (KENALOG) 0.1 % Apply 1 application topically 4 (four) times daily. As needed for itching (Patient taking differently: Apply 1 application topically 3 (three) times daily as needed (for rash).) 45 g 2   No current facility-administered medications on file prior to visit.    Allergies  Allergen Reactions  . Frovatriptan Other (See Comments)    Numbness in both legs  . Sulfa Antibiotics Other (See Comments)    Makes patients legs numb     Family History  Problem Relation Age of Onset  . Cancer Mother        lung  . Hypertension Maternal Grandmother   . Diabetes Maternal Grandmother   . Thyroid disease Neg Hx     BP 120/72 (BP Location: Right Arm, Patient Position: Sitting, Cuff Size: Large)   Pulse (!) 101   Ht 5\' 3"  (1.6 m)   Wt 192 lb 3.2 oz (87.2 kg)   SpO2 97%   BMI 34.05 kg/m    Review of Systems     Objective:   Physical Exam VITAL SIGNS:  See vs page GENERAL: no distress NECK: There is no palpable thyroid enlargement.  No thyroid nodule is palpable.  No palpable lymphadenopathy at the anterior neck.   I reviewed [facility]: 12/21 rx was not filled  Lab Results  Component Value Date   TSH 2.91 12/27/2020      Assessment & Plan:  Transgender state, uncontrolled.  Polycythemia: recheck today.   Hypothyroidism: well-replaced.  Please continue the same synthroid.  Patient Instructions  Blood tests are requested for you. We'll let you know about the results.   It is best to never miss the levothyroxine pill.  However, if you do miss it, next best is to double up the next time.   I have sent a prescription to your pharmacy, to  refill the testosterone.   Please come back for a follow-up appointment in 6 months.

## 2020-12-27 NOTE — Patient Instructions (Signed)
Blood tests are requested for you. We'll let you know about the results.   It is best to never miss the levothyroxine pill.  However, if you do miss it, next best is to double up the next time.   I have sent a prescription to your pharmacy, to refill the testosterone.   Please come back for a follow-up appointment in 6 months.

## 2020-12-29 LAB — TESTOSTERONE,FREE AND TOTAL
Testosterone, Free: 13.3 pg/mL (ref 6.8–21.5)
Testosterone: 549 ng/dL (ref 264–916)

## 2021-01-02 LAB — ESTRADIOL, FREE
Estradiol, Free: 0.41 pg/mL
Estradiol: 21 pg/mL (ref <=29)

## 2021-06-16 ENCOUNTER — Other Ambulatory Visit: Payer: Self-pay

## 2021-06-16 ENCOUNTER — Ambulatory Visit (HOSPITAL_COMMUNITY)
Admission: EM | Admit: 2021-06-16 | Discharge: 2021-06-16 | Disposition: A | Discharge status: Home / Self Care | Payer: Medicare Other | Attending: Internal Medicine | Admitting: Internal Medicine

## 2021-06-16 ENCOUNTER — Encounter (HOSPITAL_COMMUNITY): Payer: Self-pay | Admitting: *Deleted

## 2021-06-16 ENCOUNTER — Emergency Department (HOSPITAL_COMMUNITY)
Admission: EM | Admit: 2021-06-16 | Discharge: 2021-06-16 | Discharge status: Against Medical Advice | Payer: Medicare Other

## 2021-06-16 DIAGNOSIS — S76301A Unspecified injury of muscle, fascia and tendon of the posterior muscle group at thigh level, right thigh, initial encounter: Secondary | ICD-10-CM

## 2021-06-16 NOTE — Discharge Instructions (Addendum)
Gentle range of motion exercises Icing of the left hamstring Use crutches as needed Return to urgent care if symptoms worsen.

## 2021-06-16 NOTE — ED Triage Notes (Signed)
Pt reports he was [place] a [place] on [place]. And due to wet sneakers he slipped an did a split. Pt now has swelling,pain and bruising to the posterior  RT leg. Pt reports difficulty walking.

## 2021-06-17 NOTE — ED Provider Notes (Signed)
[facility]    CSN: [id number] Arrival date & time: 06/16/21  1424      History   Chief Complaint Chief Complaint  Patient presents with   Leg Injury    HPI Anthony Rowe is a 40 y.o. adult comes to the urgent care with right hamstring pain which started over the weekend.  Patient was on a boat trip when he slipped and essentially did a split.  He is experiencing severe hamstring pain, aggravated by movement with no known relieving factors.  Patient has tried NSAIDs and some muscle relaxants with no significant improvement.  No bruising.  No numbness or tingling.Marland Kitchen   HPI  Past Medical History:  Diagnosis Date   Asthma    Bronchitis    COVID-19    Endometriosis    Hypertension    Migraine    Thyroid disease    hyperthyroidism per pt report    Patient Active Problem List   Diagnosis Date Noted   Acute respiratory disease due to COVID-19 virus 02/10/2020   Depression with anxiety 02/10/2020   Obesity (BMI 30.0-34.9) 02/10/2020   Polycythemia 12/20/2019   Hypothyroidism 11/06/2019   Hypokalemia 05/29/2019   Migraine 08/12/2018   HTN (hypertension) 08/12/2018   Asthma 08/12/2018   Male-to-male transgender person 08/12/2018   Gender identity disorder 08/19/2016   Left ankle pain 09/25/2013   Hypovitaminosis D 09/25/2013    Past Surgical History:  Procedure Laterality Date   ANKLE SURGERY     MASTECTOMY Bilateral 2017   male to male     OB History   No obstetric history on file.      Home Medications    Prior to Admission medications   Medication Sig Start Date End Date Taking? Authorizing Provider  albuterol (VENTOLIN HFA) 108 (90 Base) MCG/ACT inhaler Inhale 2 puffs every 4-6 hours as needed for cough, wheeze, shortness of breath or chest tightness. 05/19/20   Fayrene Helper, PA-C  buPROPion (WELLBUTRIN XL) 150 MG 24 hr tablet Take 150 mg by mouth daily. 12/05/19   [provider]  busPIRone (BUSPAR) 10 MG tablet Take 10 mg by mouth 2  (two) times daily. 11/14/19   [provider]  cyclobenzaprine (FLEXERIL) 10 MG tablet Take 10 mg by mouth 2 (two) times daily as needed.  01/25/13   [provider]  dexamethasone (DECADRON) 6 MG tablet Take 1 tablet (6 mg total) by mouth daily. 02/14/20   Ghimire, Werner Lean, MD  famotidine (PEPCID) 20 MG tablet Take 1 tablet (20 mg total) by mouth 2 (two) times daily. 07/26/19   Marcelyn Bruins, MD  fluticasone (FLONASE) 50 MCG/ACT nasal spray Place 1 spray into both nostrils daily. Patient taking differently: Place 1 spray into both nostrils daily as needed for allergies. 07/14/19   Georgetta Haber, NP  fluticasone (FLOVENT HFA) 220 MCG/ACT inhaler Inhale 2 puffs into the lungs twice daily 02/09/20   Marcelyn Bruins, MD  gabapentin (NEURONTIN) 100 MG capsule Take 200 mg by mouth at bedtime.  01/10/20   [provider]  hydrOXYzine (ATARAX/VISTARIL) 25 MG tablet Take 1-2 tablets (25-50 mg total) by mouth at bedtime as needed. Patient taking differently: Take 25-50 mg by mouth at bedtime as needed for anxiety or vomiting. 07/26/19   Marcelyn Bruins, MD  ibuprofen (ADVIL,MOTRIN) 800 MG tablet Take 800-1,600 mg by mouth every 8 (eight) hours as needed for headache, mild pain or moderate pain (migraine).     [provider]  levocetirizine (  XYZAL) 5 MG tablet Take 1 tablet (5 mg total) by mouth 2 (two) times daily. Patient taking differently: Take 5 mg by mouth daily as needed for allergies. 03/30/19   Marcelyn Bruins, MD  levothyroxine (SYNTHROID) 175 MCG tablet Take 1 tablet (175 mcg total) by mouth daily before breakfast. 04/19/20   Romero Belling, MD  loperamide (IMODIUM) 2 MG capsule Take 1 capsule (2 mg total) by mouth daily as needed for diarrhea or loose stools. 07/18/19   Wallis Bamberg, PA-C  metoprolol succinate (TOPROL-XL) 25 MG 24 hr tablet Take 1 tablet (25 mg total) by mouth daily. Take with or immediately following a meal.  05/19/20   Fayrene Helper, PA-C  montelukast (SINGULAIR) 10 MG tablet Take 1 tablet (10 mg total) by mouth at bedtime. 07/26/19   Marcelyn Bruins, MD  ondansetron (ZOFRAN-ODT) 8 MG disintegrating tablet Take 1 tablet (8 mg total) by mouth every 8 (eight) hours as needed for nausea or vomiting. 07/18/19   Wallis Bamberg, PA-C  rizatriptan (MAXALT) 10 MG tablet Take 10 mg by mouth daily as needed for migraine.  01/02/20   [provider]  testosterone cypionate (DEPOTESTOTERONE CYPIONATE) 100 MG/ML injection 0.4 MLS(40 MG TOTAL) INTO THE MUSCLE EVERY 10 DAYS, and syringes 1/10 days 12/27/20   Romero Belling, MD  traZODone (DESYREL) 100 MG tablet Take 100 mg by mouth at bedtime as needed for sleep.     [provider]  triamcinolone cream (KENALOG) 0.1 % Apply 1 application topically 4 (four) times daily. As needed for itching Patient taking differently: Apply 1 application topically 3 (three) times daily as needed (for rash). 07/26/19   Romero Belling, MD    Family History Family History  Problem Relation Age of Onset   Cancer Mother        lung   Hypertension Maternal Grandmother    Diabetes Maternal Grandmother    Thyroid disease Neg Hx     Social History Social History   Tobacco Use   Smoking status: Never   Smokeless tobacco: Never  Vaping Use   Vaping Use: Never used  Substance Use Topics   Alcohol use: No   Drug use: No     Allergies   Frovatriptan and Sulfa antibiotics   Review of Systems Review of Systems  Gastrointestinal: Negative.   Genitourinary: Negative.   Musculoskeletal:  Positive for arthralgias and myalgias. Negative for back pain, joint swelling and neck pain.  Neurological: Negative.     Physical Exam Triage Vital Signs ED Triage Vitals  Enc Vitals Group     BP 06/16/21 1542 (!) 149/93     Pulse Rate 06/16/21 1542 71     Resp 06/16/21 1542 20     Temp 06/16/21 1542 98.1 F (36.7 C)     Temp src --      SpO2 06/16/21 1542 100 %      Weight --      Height --      Head Circumference --      Peak Flow --      Pain Score 06/16/21 1538 10     Pain Loc --      Pain Edu? --      Excl. in GC? --    No data found.  Updated Vital Signs BP (!) 149/93   Pulse 71   Temp 98.1 F (36.7 C)   Resp 20   SpO2 100%   Visual Acuity Right Eye Distance:   Left Eye Distance:  Bilateral Distance:    Right Eye Near:   Left Eye Near:    Bilateral Near:     Physical Exam Vitals and nursing note reviewed.  Constitutional:      General: He is in acute distress.     Appearance: He is not ill-appearing.  Cardiovascular:     Rate and Rhythm: Normal rate and regular rhythm.  Pulmonary:     Effort: Pulmonary effort is normal.     Breath sounds: Normal breath sounds.  Musculoskeletal:        General: Swelling, tenderness and signs of injury present. No deformity. Normal range of motion.     Right lower leg: No edema.     Left lower leg: No edema.  Skin:    General: Skin is warm and dry.     Findings: No bruising.  Neurological:     Mental Status: He is alert.     UC Treatments / Results  Labs (all labs ordered are listed, but only abnormal results are displayed) Labs Reviewed - No data to display  EKG   Radiology No results found.  Procedures Procedures (including critical care time)  Medications Ordered in UC Medications - No data to display  Initial Impression / Assessment and Plan / UC Course  I have reviewed the triage vital signs and the nursing notes.  Pertinent labs & imaging results that were available during my care of the patient were reviewed by me and considered in my medical decision making (see chart for details).     1.  Right hamstring injury: Continue icing of the right leg Use crutches as needed Continue taking muscle relaxants and NSAIDs Gentle range of motion exercises If symptoms worsen please return to urgent care or follow-up with orthopedic surgery. Final Clinical  Impressions(s) / UC Diagnoses   Final diagnoses:  Right hamstring injury, initial encounter     Discharge Instructions      Gentle range of motion exercises Icing of the left hamstring Use crutches as needed Return to urgent care if symptoms worsen.   ED Prescriptions   None    PDMP not reviewed this encounter.   Merrilee Jansky, MD 06/18/21 [phone]

## 2021-06-27 ENCOUNTER — Ambulatory Visit (INDEPENDENT_AMBULATORY_CARE_PROVIDER_SITE_OTHER): Payer: Medicare Other | Admitting: Endocrinology

## 2021-06-27 ENCOUNTER — Other Ambulatory Visit: Payer: Self-pay

## 2021-06-27 VITALS — BP 160/100 | HR 86 | Ht 63.0 in | Wt 187.2 lb

## 2021-06-27 DIAGNOSIS — E890 Postprocedural hypothyroidism: Secondary | ICD-10-CM

## 2021-06-27 DIAGNOSIS — D751 Secondary polycythemia: Secondary | ICD-10-CM

## 2021-06-27 DIAGNOSIS — Z789 Other specified health status: Secondary | ICD-10-CM | POA: Diagnosis not present

## 2021-06-27 LAB — BASIC METABOLIC PANEL
BUN: 9 mg/dL (ref 6–23)
CO2: 27 mEq/L (ref 19–32)
Calcium: 9.6 mg/dL (ref 8.4–10.5)
Chloride: 104 mEq/L (ref 96–112)
Creatinine, Ser: 1.25 mg/dL (ref 0.40–1.50)
GFR: 71.95 mL/min (ref >60.00)
Glucose, Bld: 91 mg/dL (ref 70–99)
Potassium: 4.1 mEq/L (ref 3.5–5.1)
Sodium: 140 mEq/L (ref 135–145)

## 2021-06-27 LAB — CBC WITH DIFFERENTIAL/PLATELET
Basophils Absolute: 0 10*3/uL (ref 0.0–0.1)
Basophils Relative: 1.2 % (ref 0.0–3.0)
Eosinophils Absolute: 0.1 10*3/uL (ref 0.0–0.7)
Eosinophils Relative: 1.6 % (ref 0.0–5.0)
HCT: 46.3 % (ref 39.0–52.0)
Hemoglobin: 15.2 g/dL (ref 13.0–17.0)
Lymphocytes Relative: 29.6 % (ref 12.0–46.0)
Lymphs Abs: 1.2 10*3/uL (ref 0.7–4.0)
MCHC: 32.8 g/dL (ref 30.0–36.0)
MCV: 89.4 fl (ref 78.0–100.0)
Monocytes Absolute: 0.3 10*3/uL (ref 0.1–1.0)
Monocytes Relative: 8 % (ref 3.0–12.0)
Neutro Abs: 2.4 10*3/uL (ref 1.4–7.7)
Neutrophils Relative %: 59.6 % (ref 43.0–77.0)
Platelets: 330 10*3/uL (ref 150.0–400.0)
RBC: 5.18 Mil/uL (ref 4.22–5.81)
RDW: 14.2 % (ref 11.5–15.5)
WBC: 4 10*3/uL (ref 4.0–10.5)

## 2021-06-27 LAB — TSH: TSH: 0.48 u[IU]/mL (ref 0.35–5.50)

## 2021-06-27 LAB — T4, FREE: Free T4: 1.1 ng/dL (ref 0.60–1.60)

## 2021-06-27 NOTE — Progress Notes (Signed)
Subjective:    Patient ID: Anthony Rowe, adult    DOB: Jun 26, 1981, 40 y.o.   MRN: [id number]  HPI Pt has transgender state (F to M): Surgery: breast reduction (2017) and TAH/BSO (2022) Medication: testosterone since 2015 Counseling: he last went in 2014 Other: ins declined elagolix; testosterone dosage is limited by polycythemia.  Interval hx: He takes 40 mg every 10 days.  Last dose was 2 weeks ago.   Pt also has post-RAI hypothyroidism (dx'ed 2019; tapazole was chosen as initial rx, due to severity; in 06/2019, pt had RAI rx; in 11/20, he started synthroid for hypothyroidism).   pt states he feels well in general.  Pt says he never misses the synthroid.   He was advised he needed to lose weight prior to genital surgery.   Past Medical History:  Diagnosis Date   Asthma    Bronchitis    COVID-19    Endometriosis    Hypertension    Migraine    Thyroid disease    hyperthyroidism per pt report    Past Surgical History:  Procedure Laterality Date   ANKLE SURGERY     MASTECTOMY Bilateral 2017   male to male     Social History   Socioeconomic History   Marital status: Married    Spouse name: Not on file   Number of children: Not on file   Years of education: Not on file   Highest education level: Not on file  Occupational History   Not on file  Tobacco Use   Smoking status: Never   Smokeless tobacco: Never  Vaping Use   Vaping Use: Never used  Substance and Sexual Activity   Alcohol use: No   Drug use: No   Sexual activity: Not Currently  Other Topics Concern   Not on file  Social History Narrative   Not on file   Social Determinants of Health   Financial Resource Strain: Not on file  Food Insecurity: Not on file  Transportation Needs: Not on file  Physical Activity: Not on file  Stress: Not on file  Social Connections: Not on file  Intimate Partner Violence: Not on file    Current Outpatient Medications on File Prior to Visit  Medication Sig  Dispense Refill   albuterol (VENTOLIN HFA) 108 (90 Base) MCG/ACT inhaler Inhale 2 puffs every 4-6 hours as needed for cough, wheeze, shortness of breath or chest tightness. 18 g 0   buPROPion (WELLBUTRIN XL) 150 MG 24 hr tablet Take 150 mg by mouth daily.     busPIRone (BUSPAR) 10 MG tablet Take 10 mg by mouth 2 (two) times daily.     cyclobenzaprine (FLEXERIL) 10 MG tablet Take 10 mg by mouth 2 (two) times daily as needed.      famotidine (PEPCID) 20 MG tablet Take 1 tablet (20 mg total) by mouth 2 (two) times daily. 60 tablet 5   fluticasone (FLONASE) 50 MCG/ACT nasal spray Place 1 spray into both nostrils daily. (Patient taking differently: Place 1 spray into both nostrils daily as needed for allergies.) 16 g 0   fluticasone (FLOVENT HFA) 220 MCG/ACT inhaler Inhale 2 puffs into the lungs twice daily 1 Inhaler 0   gabapentin (NEURONTIN) 100 MG capsule Take 200 mg by mouth at bedtime.      hydrOXYzine (ATARAX/VISTARIL) 25 MG tablet Take 1-2 tablets (25-50 mg total) by mouth at bedtime as needed. (Patient taking differently: Take 25-50 mg by mouth at bedtime as needed for anxiety or vomiting.)  60 tablet 5   levocetirizine (XYZAL) 5 MG tablet Take 1 tablet (5 mg total) by mouth 2 (two) times daily. (Patient taking differently: Take 5 mg by mouth daily as needed for allergies.) 60 tablet 5   levothyroxine (SYNTHROID) 175 MCG tablet Take 1 tablet (175 mcg total) by mouth daily before breakfast. 90 tablet 3   loperamide (IMODIUM) 2 MG capsule Take 1 capsule (2 mg total) by mouth daily as needed for diarrhea or loose stools. 10 capsule 0   metoprolol succinate (TOPROL-XL) 25 MG 24 hr tablet Take 1 tablet (25 mg total) by mouth daily. Take with or immediately following a meal. 30 tablet 0   montelukast (SINGULAIR) 10 MG tablet Take 1 tablet (10 mg total) by mouth at bedtime. 30 tablet 5   ondansetron (ZOFRAN-ODT) 8 MG disintegrating tablet Take 1 tablet (8 mg total) by mouth every 8 (eight) hours as needed  for nausea or vomiting. 30 tablet 0   rizatriptan (MAXALT) 10 MG tablet Take 10 mg by mouth daily as needed for migraine.      testosterone cypionate (DEPOTESTOTERONE CYPIONATE) 100 MG/ML injection 0.4 MLS(40 MG TOTAL) INTO THE MUSCLE EVERY 10 DAYS, and syringes 1/10 days 10 mL 0   traZODone (DESYREL) 100 MG tablet Take 100 mg by mouth at bedtime as needed for sleep.      triamcinolone cream (KENALOG) 0.1 % Apply 1 application topically 4 (four) times daily. As needed for itching (Patient taking differently: Apply 1 application topically 3 (three) times daily as needed (for rash).) 45 g 2   No current facility-administered medications on file prior to visit.    Allergies  Allergen Reactions   Frovatriptan Other (See Comments)    Numbness in both legs   Sulfa Antibiotics Other (See Comments)    Makes patients legs numb     Family History  Problem Relation Age of Onset   Cancer Mother        lung   Hypertension Maternal Grandmother    Diabetes Maternal Grandmother    Thyroid disease Neg Hx     BP (!) 160/100 (BP Location: Right Arm, Patient Position: Sitting, Cuff Size: Large)   Pulse 86   Ht 5\' 3"  (1.6 m)   Wt 187 lb 3.2 oz (84.9 kg)   SpO2 98%   BMI 33.16 kg/m    Review of Systems     Objective:   Physical Exam NECK: There is no palpable thyroid enlargement.  No thyroid nodule is palpable.  No palpable lymphadenopathy at the anterior neck.  Lab Results  Component Value Date   TSH 0.48 06/27/2021    Lab Results  Component Value Date   WBC 4.0 06/27/2021   HGB 15.2 06/27/2021   HCT 46.3 06/27/2021   MCV 89.4 06/27/2021   PLT 330.0 06/27/2021      Assessment & Plan:  Polycythemia: stable.  We'll follow Hypothyroidism: well-controlled.  Please continue the same synthroid. Transgender state: recheck labs today.    Patient Instructions  Your blood pressure is high today.  Please see your primary care provider soon, to have it rechecked.   Blood tests are  requested for you. We'll let you know about the results.    It is best to never miss the levothyroxine pill.  However, if you do miss it, next best is to double up the next time.   Please come back for a follow-up appointment in 6 months.

## 2021-06-27 NOTE — Patient Instructions (Addendum)
Your blood pressure is high today.  Please see your primary care provider soon, to have it rechecked.   Blood tests are requested for you. We'll let you know about the results.    It is best to never miss the levothyroxine pill.  However, if you do miss it, next best is to double up the next time.   Please come back for a follow-up appointment in 6 months.

## 2021-06-28 MED ORDER — TESTOSTERONE CYPIONATE 100 MG/ML IM SOLN: INTRAMUSCULAR | 0 refills | Status: DC

## 2021-07-03 LAB — TESTOSTERONE,FREE AND TOTAL
Testosterone, Free: 10.2 pg/mL (ref 6.8–21.5)
Testosterone: 398 ng/dL (ref 264–916)

## 2021-07-04 ENCOUNTER — Other Ambulatory Visit: Payer: Self-pay | Admitting: Internal Medicine

## 2021-07-05 LAB — COMPLETE METABOLIC PANEL WITH GFR
AG Ratio: 1.9 (calc) (ref 1.0–2.5)
ALT: 10 U/L (ref 9–46)
AST: 14 U/L (ref 10–40)
Albumin: 4.1 g/dL (ref 3.6–5.1)
Alkaline phosphatase (APISO): 71 U/L (ref 36–130)
BUN: 12 mg/dL (ref 7–25)
CO2: 23 mmol/L (ref 20–32)
Calcium: 9.4 mg/dL (ref 8.6–10.3)
Chloride: 105 mmol/L (ref 98–110)
Creat: 1.1 mg/dL (ref 0.60–1.29)
Globulin: 2.2 g/dL (calc) (ref 1.9–3.7)
Glucose, Bld: 96 mg/dL (ref 65–99)
Potassium: 3.9 mmol/L (ref 3.5–5.3)
Sodium: 139 mmol/L (ref 135–146)
Total Bilirubin: 0.8 mg/dL (ref 0.2–1.2)
Total Protein: 6.3 g/dL (ref 6.1–8.1)
eGFR: 87 mL/min/{1.73_m2} (ref >=60)

## 2021-07-05 LAB — CBC
HCT: 46.9 % (ref 38.5–50.0)
Hemoglobin: 15.4 g/dL (ref 13.2–17.1)
MCH: 29.4 pg (ref 27.0–33.0)
MCHC: 32.8 g/dL (ref 32.0–36.0)
MCV: 89.7 fL (ref 80.0–100.0)
MPV: 9.1 fL (ref 7.5–12.5)
Platelets: 323 10*3/uL (ref 140–400)
RBC: 5.23 10*6/uL (ref 4.20–5.80)
RDW: 13.3 % (ref 11.0–15.0)
WBC: 4.1 10*3/uL (ref 3.8–10.8)

## 2021-07-05 LAB — LIPID PANEL
Cholesterol: 200 mg/dL — ABNORMAL HIGH (ref <200)
HDL: 47 mg/dL (ref >=40)
LDL Cholesterol (Calc): 127 mg/dL (calc) — ABNORMAL HIGH
Non-HDL Cholesterol (Calc): 153 mg/dL (calc) — ABNORMAL HIGH (ref <130)
Total CHOL/HDL Ratio: 4.3 (calc) (ref <5.0)
Triglycerides: 138 mg/dL (ref <150)

## 2021-07-05 LAB — TSH: TSH: 0.67 mIU/L (ref 0.40–4.50)

## 2021-07-05 LAB — PSA: PSA: <0.04 ng/mL (ref <=4.00)

## 2021-07-05 LAB — VITAMIN D 25 HYDROXY (VIT D DEFICIENCY, FRACTURES): Vit D, 25-Hydroxy: 20 ng/mL — ABNORMAL LOW (ref 30–100)

## 2021-07-06 LAB — ESTRADIOL, FREE
Estradiol, Free: 0.6 pg/mL — ABNORMAL HIGH
Estradiol: 34 pg/mL — ABNORMAL HIGH (ref <=29)

## 2021-12-17 ENCOUNTER — Emergency Department (HOSPITAL_COMMUNITY): Payer: Medicare Other

## 2021-12-17 ENCOUNTER — Emergency Department (HOSPITAL_COMMUNITY)
Admission: EM | Admit: 2021-12-17 | Discharge: 2021-12-17 | Disposition: A | Discharge status: Home / Self Care | Payer: Medicare Other | Attending: Emergency Medicine | Admitting: Emergency Medicine

## 2021-12-17 ENCOUNTER — Encounter (HOSPITAL_COMMUNITY): Payer: Self-pay

## 2021-12-17 DIAGNOSIS — Z79899 Other long term (current) drug therapy: Secondary | ICD-10-CM | POA: Insufficient documentation

## 2021-12-17 DIAGNOSIS — R202 Paresthesia of skin: Secondary | ICD-10-CM | POA: Insufficient documentation

## 2021-12-17 DIAGNOSIS — R0602 Shortness of breath: Secondary | ICD-10-CM | POA: Diagnosis not present

## 2021-12-17 DIAGNOSIS — R072 Precordial pain: Secondary | ICD-10-CM | POA: Diagnosis not present

## 2021-12-17 DIAGNOSIS — J45909 Unspecified asthma, uncomplicated: Secondary | ICD-10-CM | POA: Insufficient documentation

## 2021-12-17 DIAGNOSIS — I10 Essential (primary) hypertension: Secondary | ICD-10-CM | POA: Insufficient documentation

## 2021-12-17 DIAGNOSIS — Z7951 Long term (current) use of inhaled steroids: Secondary | ICD-10-CM | POA: Insufficient documentation

## 2021-12-17 DIAGNOSIS — R079 Chest pain, unspecified: Secondary | ICD-10-CM | POA: Diagnosis present

## 2021-12-17 LAB — CBC WITH DIFFERENTIAL/PLATELET
Abs Immature Granulocytes: 0.02 10*3/uL (ref 0.00–0.07)
Basophils Absolute: 0 10*3/uL (ref 0.0–0.1)
Basophils Relative: 1 %
Eosinophils Absolute: 0 10*3/uL (ref 0.0–0.5)
Eosinophils Relative: 1 %
HCT: 46.4 % (ref 39.0–52.0)
Hemoglobin: 15.9 g/dL (ref 13.0–17.0)
Immature Granulocytes: 0 %
Lymphocytes Relative: 23 %
Lymphs Abs: 2 10*3/uL (ref 0.7–4.0)
MCH: 29.6 pg (ref 26.0–34.0)
MCHC: 34.3 g/dL (ref 30.0–36.0)
MCV: 86.2 fL (ref 80.0–100.0)
Monocytes Absolute: 0.6 10*3/uL (ref 0.1–1.0)
Monocytes Relative: 7 %
Neutro Abs: 5.8 10*3/uL (ref 1.7–7.7)
Neutrophils Relative %: 68 %
Platelets: 291 10*3/uL (ref 150–400)
RBC: 5.38 MIL/uL (ref 4.22–5.81)
RDW: 14.3 % (ref 11.5–15.5)
WBC: 8.4 10*3/uL (ref 4.0–10.5)
nRBC: 0 % (ref 0.0–0.2)

## 2021-12-17 LAB — BASIC METABOLIC PANEL
Anion gap: 10 (ref 5–15)
BUN: 6 mg/dL (ref 6–20)
CO2: 23 mmol/L (ref 22–32)
Calcium: 9.2 mg/dL (ref 8.9–10.3)
Chloride: 106 mmol/L (ref 98–111)
Creatinine, Ser: 1.08 mg/dL (ref 0.61–1.24)
GFR, Estimated: >60 mL/min (ref >60)
Glucose, Bld: 102 mg/dL — ABNORMAL HIGH (ref 70–99)
Potassium: 2.9 mmol/L — ABNORMAL LOW (ref 3.5–5.1)
Sodium: 139 mmol/L (ref 135–145)

## 2021-12-17 LAB — TROPONIN I (HIGH SENSITIVITY): Troponin I (High Sensitivity): 2 ng/L (ref <18)

## 2021-12-17 LAB — D-DIMER, QUANTITATIVE: D-Dimer, Quant: 0.68 ug/mL-FEU — ABNORMAL HIGH (ref 0.00–0.50)

## 2021-12-17 MED ORDER — KETOROLAC TROMETHAMINE 15 MG/ML IJ SOLN
15.0000 mg | Freq: Once | INTRAMUSCULAR | Status: AC
  Administered 2021-12-17: 15 mg via INTRAVENOUS
  Filled 2021-12-17: qty 1

## 2021-12-17 MED ORDER — LORAZEPAM 1 MG PO TABS
1.0000 mg | ORAL_TABLET | Freq: Once | ORAL | Status: AC
  Administered 2021-12-17: 1 mg via ORAL
  Filled 2021-12-17: qty 1

## 2021-12-17 MED ORDER — PREDNISONE 20 MG PO TABS: 40.0000 mg | ORAL_TABLET | Freq: Every day | ORAL | 0 refills | Status: AC

## 2021-12-17 MED ORDER — SODIUM CHLORIDE (PF) 0.9 % IJ SOLN
INTRAMUSCULAR | Status: AC
  Filled 2021-12-17: qty 50

## 2021-12-17 MED ORDER — POTASSIUM CHLORIDE CRYS ER 20 MEQ PO TBCR
40.0000 meq | EXTENDED_RELEASE_TABLET | Freq: Once | ORAL | Status: AC
  Administered 2021-12-17: 40 meq via ORAL
  Filled 2021-12-17: qty 2

## 2021-12-17 MED ORDER — LIDOCAINE 4 % EX PTCH
1.0000 | MEDICATED_PATCH | Freq: Two times a day (BID) | CUTANEOUS | 0 refills | Status: DC | PRN
Start: 2021-12-17 — End: 2024-06-08

## 2021-12-17 MED ORDER — IOHEXOL 350 MG/ML SOLN
100.0000 mL | Freq: Once | INTRAVENOUS | Status: AC | PRN
  Administered 2021-12-17: 64 mL via INTRAVENOUS

## 2021-12-17 NOTE — ED Provider Notes (Signed)
[name]-EMERGENCY DEPT Provider Note   CSN: [id number] Arrival date & time: 12/17/21  [id number]     History  Chief Complaint  Patient presents with   Chest Pain   Shortness of Breath    Anthony Rowe is a 41 y.o. adult with past medical history of asthma, hypertension, migraine, thyroid disease, depression, anxiety, male to male transition.  Presents to the emergency department with a chief complaint of chest pain and shortness of breath.  Patient reports that chest pain started at 8:30 PM yesterday evening.  Pain has been constant since then.  Pain is located to her mid sternum and radiates to the left side of his chest and arm.  Patient describes pain as a tightness.  Rates pain 10/10 on the pain present.  Pain is worse with deep inspiration palpation.  Denies any alleviating factors.  Has tried taking over-the-counter pain medication as well as albuterol inhaler with no relief of symptoms.  He endorses associated shortness of breath.  While in the emergency department patient reports that she developed numbness and tingling to bilateral hands and feet.  States that this has been constant.  Denies any associated weakness.  Denies any leg swelling or tenderness, hemoptysis, palpitations, lightheadedness, syncope, fever, chills, cough.    Chest Pain Associated symptoms: numbness and shortness of breath   Associated symptoms: no abdominal pain, no back pain, no cough, no dizziness, no fever, no headache, no nausea, no palpitations, no vomiting and no weakness   Shortness of Breath Associated symptoms: chest pain   Associated symptoms: no abdominal pain, no cough, no fever, no headaches, no neck pain, no rash and no vomiting       Home Medications Prior to Admission medications   Medication Sig Start Date End Date Taking? Authorizing Provider  albuterol (VENTOLIN HFA) 108 (90 Base) MCG/ACT inhaler Inhale 2 puffs every 4-6 hours as needed for cough, wheeze,  shortness of breath or chest tightness. 05/19/20   Fayrene Helperran, Bowie, PA-C  buPROPion (WELLBUTRIN XL) 150 MG 24 hr tablet Take 150 mg by mouth daily. 12/05/19   [provider]  busPIRone (BUSPAR) 10 MG tablet Take 10 mg by mouth 2 (two) times daily. 11/14/19   [provider]  cyclobenzaprine (FLEXERIL) 10 MG tablet Take 10 mg by mouth 2 (two) times daily as needed.  01/25/13   [provider]  famotidine (PEPCID) 20 MG tablet Take 1 tablet (20 mg total) by mouth 2 (two) times daily. 07/26/19   Marcelyn BruinsPadgett, Shaylar Patricia, MD  fluticasone (FLONASE) 50 MCG/ACT nasal spray Place 1 spray into both nostrils daily. Patient taking differently: Place 1 spray into both nostrils daily as needed for allergies. 07/14/19   Georgetta HaberBurky, Natalie B, NP  fluticasone (FLOVENT HFA) 220 MCG/ACT inhaler Inhale 2 puffs into the lungs twice daily 02/09/20   Marcelyn BruinsPadgett, Shaylar Patricia, MD  gabapentin (NEURONTIN) 100 MG capsule Take 200 mg by mouth at bedtime.  01/10/20   [provider]  hydrOXYzine (ATARAX/VISTARIL) 25 MG tablet Take 1-2 tablets (25-50 mg total) by mouth at bedtime as needed. Patient taking differently: Take 25-50 mg by mouth at bedtime as needed for anxiety or vomiting. 07/26/19   Marcelyn BruinsPadgett, Shaylar Patricia, MD  levocetirizine (XYZAL) 5 MG tablet Take 1 tablet (5 mg total) by mouth 2 (two) times daily. Patient taking differently: Take 5 mg by mouth daily as needed for allergies. 03/30/19   Marcelyn BruinsPadgett, Shaylar Patricia, MD  levothyroxine (SYNTHROID) 175 MCG tablet Take 1 tablet (175 mcg  total) by mouth daily before breakfast. 04/19/20   Romero BellingEllison, Sean, MD  loperamide (IMODIUM) 2 MG capsule Take 1 capsule (2 mg total) by mouth daily as needed for diarrhea or loose stools. 07/18/19   Wallis BambergMani, Mario, PA-C  metoprolol succinate (TOPROL-XL) 25 MG 24 hr tablet Take 1 tablet (25 mg total) by mouth daily. Take with or immediately following a meal. 05/19/20   Fayrene Helperran, Bowie, PA-C  montelukast (SINGULAIR) 10 MG tablet  Take 1 tablet (10 mg total) by mouth at bedtime. 07/26/19   Marcelyn BruinsPadgett, Shaylar Patricia, MD  ondansetron (ZOFRAN-ODT) 8 MG disintegrating tablet Take 1 tablet (8 mg total) by mouth every 8 (eight) hours as needed for nausea or vomiting. 07/18/19   Wallis BambergMani, Mario, PA-C  rizatriptan (MAXALT) 10 MG tablet Take 10 mg by mouth daily as needed for migraine.  01/02/20   [provider]  testosterone cypionate (DEPOTESTOTERONE CYPIONATE) 100 MG/ML injection 0.4 MLS(40 MG TOTAL) INTO THE MUSCLE EVERY 10 DAYS, and syringes 1/10 days 06/28/21   Romero BellingEllison, Sean, MD  traZODone (DESYREL) 100 MG tablet Take 100 mg by mouth at bedtime as needed for sleep.     [provider]  triamcinolone cream (KENALOG) 0.1 % Apply 1 application topically 4 (four) times daily. As needed for itching Patient taking differently: Apply 1 application topically 3 (three) times daily as needed (for rash). 07/26/19   Romero BellingEllison, Sean, MD      Allergies    Frovatriptan and Sulfa antibiotics    Review of Systems   Review of Systems  Constitutional:  Negative for chills and fever.  Eyes:  Negative for visual disturbance.  Respiratory:  Positive for shortness of breath. Negative for cough.   Cardiovascular:  Positive for chest pain. Negative for palpitations and leg swelling.  Gastrointestinal:  Negative for abdominal pain, nausea and vomiting.  Musculoskeletal:  Negative for back pain and neck pain.  Skin:  Negative for color change and rash.  Neurological:  Positive for numbness. Negative for dizziness, tremors, seizures, syncope, facial asymmetry, speech difficulty, weakness, light-headedness and headaches.  Psychiatric/Behavioral:  Negative for confusion. The patient is nervous/anxious.    Physical Exam Updated Vital Signs BP (!) 187/116 (BP Location: Right Arm)    Pulse (!) 107    Temp 97.8 F (36.6 C) (Oral)    Resp (!) 22    SpO2 100%  Physical Exam Vitals and nursing note reviewed.  Constitutional:      General: He is  not in acute distress.    Appearance: He is not ill-appearing, toxic-appearing or diaphoretic.  HENT:     Head: Normocephalic.  Eyes:     General: No scleral icterus.       Right eye: No discharge.        Left eye: No discharge.     Extraocular Movements: Extraocular movements intact.     Pupils: Pupils are equal, round, and reactive to light.  Cardiovascular:     Rate and Rhythm: Tachycardia present.     Pulses:          Radial pulses are 2+ on the right side and 2+ on the left side.     Heart sounds: Normal heart sounds. No murmur heard. Pulmonary:     Effort: Pulmonary effort is normal. Tachypnea present. No respiratory distress.     Breath sounds: Normal breath sounds. No stridor. No decreased breath sounds, wheezing, rhonchi or rales.  Abdominal:     General: Abdomen is flat. There is no distension. There are  no signs of injury.     Palpations: Abdomen is soft. There is no mass or pulsatile mass.     Tenderness: There is no abdominal tenderness. There is no guarding or rebound.  Musculoskeletal:     Cervical back: Normal range of motion and neck supple. No rigidity.     Right lower leg: No edema.     Left lower leg: No edema.  Skin:    General: Skin is warm and dry.  Neurological:     General: No focal deficit present.     Mental Status: He is alert and oriented to person, place, and time.     GCS: GCS eye subscore is 4. GCS verbal subscore is 5. GCS motor subscore is 6.     Cranial Nerves: No dysarthria or facial asymmetry.     Sensory: Sensation is intact.     Motor: No weakness, tremor, seizure activity or pronator drift.     Coordination: Finger-Nose-Finger Test normal.     Gait: Gait is intact. Gait normal.     Comments: +5 strength to bilateral upper and lower extremities.  Grip strength equal.  Pronator drift negative.  Sensation to light touch grossly intact to bilateral upper and lower extremities.  Psychiatric:        Mood and Affect: Mood is anxious.         Behavior: Behavior is cooperative.    ED Results / Procedures / Treatments   Labs (all labs ordered are listed, but only abnormal results are displayed) Labs Reviewed  BASIC METABOLIC PANEL - Abnormal; Notable for the following components:      Result Value   Potassium 2.9 (*)    Glucose, Bld 102 (*)    All other components within normal limits  D-DIMER, QUANTITATIVE - Abnormal; Notable for the following components:   D-Dimer, Quant 0.68 (*)    All other components within normal limits  CBC WITH DIFFERENTIAL/PLATELET  TROPONIN I (HIGH SENSITIVITY)    EKG EKG Interpretation  Date/Time:  Wednesday December 17 2021 09:37:07 EST Ventricular Rate:  111 PR Interval:  147 QRS Duration: 74 QT Interval:  318 QTC Calculation: 433 R Axis:   63 Text Interpretation: Sinus tachycardia Probable left atrial enlargement Nonspecific T abnormalities, lateral leads Confirmed by Kristine Royal [phone]) on 12/17/2021 11:59:22 AM  Radiology DG Chest 2 View  Result Date: 12/17/2021 CLINICAL DATA:  Chest pain and shortness of breath. EXAM: CHEST - 2 VIEW COMPARISON:  Chest x-ray dated May 19, 2020. FINDINGS: The heart size and mediastinal contours are within normal limits. Normal pulmonary vascularity. Unchanged mild scarring at the left costophrenic angle. No focal consolidation, pleural effusion, or pneumothorax. No acute osseous abnormality. IMPRESSION: 1. No acute cardiopulmonary disease. Electronically Signed   By: Obie Dredge M.D.   On: 12/17/2021 10:59   CT Angio Chest PE W and/or Wo Contrast  Result Date: 12/17/2021 CLINICAL DATA:  PE suspected, left-sided chest pain shortness of breath EXAM: CT ANGIOGRAPHY CHEST WITH CONTRAST TECHNIQUE: Multidetector CT imaging of the chest was performed using the standard protocol during bolus administration of intravenous contrast. Multiplanar CT image reconstructions and MIPs were obtained to evaluate the vascular anatomy. RADIATION DOSE REDUCTION: This  exam was performed according to the departmental dose-optimization program which includes automated exposure control, adjustment of the mA and/or kV according to patient size and/or use of iterative reconstruction technique. CONTRAST:  31mL OMNIPAQUE IOHEXOL 350 MG/ML SOLN COMPARISON:  None. FINDINGS: Cardiovascular: Examination for pulmonary embolism is limited by  breath motion artifact, particularly in the lung bases. Within this limitation, no pulmonary embolism identified to the proximal segmental pulmonary arterial level. Normal heart size. No pericardial effusion. Mediastinum/Nodes: No enlarged mediastinal, hilar, or axillary lymph nodes. Thyroid gland, trachea, and esophagus demonstrate no significant findings. Lungs/Pleura: Small left pleural effusion associated atelectasis or consolidation. Upper Abdomen: No acute abnormality. Musculoskeletal: No chest wall abnormality. No acute osseous findings. Review of the MIP images confirms the above findings. IMPRESSION: 1. Examination for pulmonary embolism is limited by breath motion artifact, particularly in the lung bases. Within this limitation, no pulmonary embolism identified to the proximal segmental pulmonary arterial level. 2. Small left pleural effusion and associated atelectasis or consolidation. Electronically Signed   By: Jearld Lesch M.D.   On: 12/17/2021 14:23    Procedures Procedures    Medications Ordered in ED Medications  LORazepam (ATIVAN) tablet 1 mg (1 mg Oral Given 12/17/21 1023)  potassium chloride SA (KLOR-CON M) CR tablet 40 mEq (40 mEq Oral Given 12/17/21 1238)  LORazepam (ATIVAN) tablet 1 mg (1 mg Oral Given 12/17/21 1239)  ketorolac (TORADOL) 15 MG/ML injection 15 mg (15 mg Intravenous Given 12/17/21 1241)    ED Course/ Medical Decision Making/ A&P                           Medical Decision Making Amount and/or Complexity of Data Reviewed Labs: ordered. Radiology: ordered.  Risk OTC drugs. Prescription drug  management.   Alert 41 year old transgender male to male individual with presents to the ED with a chief complaint of chest pain and shortness of breath.  Patient was obtained from patient.  Past medical records were reviewed including previous provider notes and labs.  Patient has medical history as noted in HPI which complicate his care.  Due to chest pain we will initiate ACS work-up.  Patient does appear very anxious tachypneic and complaining of numbness to bilateral hands and feet.  Suspect that patient's numbness tingling sensation are secondary to anxiety.  We will give patient Ativan at this time.  Patient noted to be tachycardic, suspect this may be due to use of Rocephin azithromycin albuterol 30 to 40 minutes prior to arrival as well as anxiety we will obtain D-dimer to evaluate for possible D-dimer.  EKG was independently reviewed myself.  EKG shows sinus tachycardia with nonspecific T wave abnormalities.  Chest x-ray shows no acute cardiopulmonary disease.  Lab work was reviewed myself.  Pertinent findings include: -Dimer 0.68 -BMP shows potassium 2.9 -Troponin 2 -CBC unremarkable  Suspect that potassium of 2.9 is secondary to albuterol use prior to arrival emergency department.  However will give patient oral dose of potassium.  Patient has heart score of 3, troponin 2.  Low suspicion for ACS at this time.  With elevated D-dimer will obtain CTA to rule out PE.  CTA shows no PE to the proximal segmental pulmonary arterial level.  Small left pleural effusion and associated atelectasis or consolidation.  Suspect atelectasis over consolidation as patient is afebrile with no leukocytosis.    After receiving Toradol patient has minimal improvement in pain.  Suspect costochondritis vs musculoskeletal pain vs asthma exacerbation.  Will prescribe patient with short course of prednisone and lidocaine patch.  Patient to follow up closely with primary care provider.    Discussed  results, findings, treatment and follow up. Patient advised of return precautions. Patient verbalized understanding and agreed with plan.   Patient care discussed with attending physician  Dr.Messick.          Final Clinical Impression(s) / ED Diagnoses Final diagnoses:  Precordial chest pain    Rx / DC Orders ED Discharge Orders          Ordered    Lidocaine (HM LIDOCAINE PATCH) 4 % PTCH  Every 12 hours PRN        12/17/21 1447    predniSONE (DELTASONE) 20 MG tablet  Daily        12/17/21 1447              Berneice Heinrich 12/17/21 1853    Wynetta Fines, MD 12/18/21 1801

## 2021-12-17 NOTE — Discharge Instructions (Addendum)
[facility] to be evaluated for your chest pain and shortness of breath.  Your work-up was reassuring that you are not having acute heart attack or blood clot in your lungs today.  I have given you a prescription for steroids today.  Some common side effects include feelings of extra energy, feeling warm, increased appetite, and stomach upset.  If you are diabetic your sugars may run higher than usual.   Please take Ibuprofen (Advil, motrin) and Tylenol (acetaminophen) to relieve your pain.    You may take up to 600 MG (3 pills) of normal strength ibuprofen every 8 hours as needed.   You make take tylenol, up to 1,000 mg (two extra strength pills) every 8 hours as needed.   It is safe to take ibuprofen and tylenol at the same time as they work differently.   Do not take more than 3,000 mg tylenol in a 24 hour period (not more than one dose every 8 hours.  Please check all medication labels as many medications such as pain and cold medications may contain tylenol.  Do not drink alcohol while taking these medications.  Do not take other NSAID'S while taking ibuprofen (such as aleve or naproxen).  Please take ibuprofen with food to decrease stomach upset.  Get help right away if: Your chest pain gets worse. You have a cough that gets worse, or you cough up blood. You have severe pain in your abdomen. You faint. You have sudden, unexplained chest discomfort. You have sudden, unexplained discomfort in your arms, back, neck, or jaw. You have shortness of breath at any time. You suddenly start to sweat, or your skin gets clammy. You feel nausea or you vomit. You suddenly feel lightheaded or dizzy. You have severe weakness, or unexplained weakness or fatigue. Your heart begins to beat quickly, or it feels like it is skipping beats.

## 2021-12-17 NOTE — ED Provider Triage Note (Signed)
Emergency Medicine Provider Triage Evaluation Note  Anthony Rowe , a 41 y.o. adult  was evaluated in triage.  Pt complains of chest pain and shortness of breath.  Patient reports that chest pain started yesterday evening and has been constant since then.  Pain is midsternal and radiates to left side of his chest.  Patient endorses associated shortness of breath.  Patient reports that he has taken Tylenol, prescribed oxycodone, and albuterol inhaler with no improvement in his symptoms.  States that this does not feel like previous asthma exacerbations he has had in the past.  Review of Systems  Positive: Chest pain, shortness of breath Negative: Lightheadedness, syncope, hemoptysis, leg swelling or tenderness, palpitations, nausea, vomiting, diaphoresis  Physical Exam  BP (!) 187/116 (BP Location: Right Arm)    Pulse (!) 107    Temp 97.8 F (36.6 C) (Oral)    Resp (!) 22    SpO2 100%  Gen:   Awake, no distress   Resp:  Normal effort, to auscultation bilaterally MSK:   Moves extremities without difficulty; no swelling or tenderness to bilateral lower extremities Other:  Abdomen soft, nondistended, nontender.  +2 radial pulse bilaterally  Medical Decision Making  Medically screening exam initiated at 9:33 AM.  Appropriate orders placed.  Anthony Rowe was informed that the remainder of the evaluation will be completed by another provider, this initial triage assessment does not replace that evaluation, and the importance of remaining in the ED until their evaluation is complete.  Patient is transgender male to male transition.  Reports that they have had hysterectomy and oophorectomy, no pregnancy test ordered at this time.  ACS work-up initiated.   Loni Beckwith, [place] 12/17/21 [phone]

## 2021-12-17 NOTE — ED Triage Notes (Addendum)
Pt arrived via POV, c/o SOB, left sided chest pain radiating down arm. Hx of asthma, has been using inhalers with little relief.

## 2022-06-28 NOTE — Progress Notes (Unsigned)
Name: Anthony Rowe  MRN/ DOB: [id number], 03-10-1981    Age/ Sex: 41 y.o., adult     PCP: Fleet Contras, MD   Reason for Endocrinology Evaluation: Gender dysphoria /hyperthyroidism     Initial Endocrinology Clinic Visit: 08/12/2018    PATIENT IDENTIFIER: Anthony Rowe is a 41 y.o., adult with a past medical history of Asthma, migraine headaches . He has followed with [facility] since 08/12/2018 for consultative assistance with management of his gender dysphoria and hyperthyroidism  HISTORICAL SUMMARY: The patient was first noted with gender dysphoria at age 41. ( Male to Male ) . Puberty was at age 41.  S/P mastectomy in 2017  Gender affirming treatment started in 2015  No biological children  No hx of DVT, dyslipidemia, liver or renal disease, OSA , CAD nor CVA.  S/P hysterectomy 11/2021  HYPERTHYROID HISTORY: Pt has been diagnosed with hyperthyroidism in 07/2018 NO prior XRT exposure nor neck sx  No prior amiodarone use    Methimazole started 08/2018 Pt had an uptake and scan in 05/2019 with an elevated  24-hr uptake of 75% of I-131 S/P RAI ablation on 06/16/2019 with 12.3 mCI of I-131 Pt was started on LT-4 replacement by 09/2019 No FH of thyroid disease   Pt was seen by Dr. Everardo All 10/19 until 06/2021   SUBJECTIVE:   Today (06/29/2022):  Anthony Rowe is here for a follow up on gender dysphoria and postablative hypothyroidism     Pt tells me he sees Dr. Ronnette Hila for  Luiz Blare' disease until her departure , currently seeing  Dennie Maizes   Last testosterone injection 3-4 weeks ago   Denies rash  Good facial hair growth  Denies acne  Has proptosis which is stable  Pending genital sx , currently on Ozempic for weight loss    HOME ENDOCRINE MEDICATIONS: Levothyroxine 150 mcg daily  Testosterone cypionate 40 mg every 10 days        HISTORY:  Past Medical History:  Past Medical History:  Diagnosis Date   Asthma    Bronchitis     COVID-19    Endometriosis    Hypertension    Migraine    Thyroid disease    hyperthyroidism per pt report   Past Surgical History:  Past Surgical History:  Procedure Laterality Date   ANKLE SURGERY     MASTECTOMY Bilateral 2017   male to male    Social History:  reports that he has never smoked. He has never used smokeless tobacco. He reports that he does not drink alcohol and does not use drugs. Family History:  Family History  Problem Relation Age of Onset   Cancer Mother        lung   Hypertension Maternal Grandmother    Diabetes Maternal Grandmother    Thyroid disease Neg Hx      HOME MEDICATIONS: Allergies as of 06/29/2022       Reactions   Frovatriptan Other (See Comments)   Numbness in both legs   Sulfa Antibiotics Other (See Comments)   Makes patients legs numb         Medication List        Accurate as of June 29, 2022 12:46 PM. If you have any questions, ask your nurse or doctor.          albuterol 108 (90 Base) MCG/ACT inhaler Commonly known as: VENTOLIN HFA Inhale 2 puffs every 4-6 hours as needed for cough, wheeze, shortness of breath or chest tightness.  buPROPion 150 MG 24 hr tablet Commonly known as: WELLBUTRIN XL Take 150 mg by mouth daily.   busPIRone 10 MG tablet Commonly known as: BUSPAR Take 10 mg by mouth 2 (two) times daily.   cyclobenzaprine 10 MG tablet Commonly known as: FLEXERIL Take 10 mg by mouth 2 (two) times daily as needed.   famotidine 20 MG tablet Commonly known as: Pepcid Take 1 tablet (20 mg total) by mouth 2 (two) times daily.   Flovent HFA 220 MCG/ACT inhaler Generic drug: fluticasone Inhale 2 puffs into the lungs twice daily   fluticasone 50 MCG/ACT nasal spray Commonly known as: FLONASE Place 1 spray into both nostrils daily. What changed:  when to take this reasons to take this   gabapentin 100 MG capsule Commonly known as: NEURONTIN Take 200 mg by mouth at bedtime.   hydrOXYzine 25 MG  tablet Commonly known as: ATARAX Take 1-2 tablets (25-50 mg total) by mouth at bedtime as needed. What changed: reasons to take this   levocetirizine 5 MG tablet Commonly known as: XYZAL Take 1 tablet (5 mg total) by mouth 2 (two) times daily. What changed:  when to take this reasons to take this   levothyroxine 175 MCG tablet Commonly known as: SYNTHROID Take 1 tablet (175 mcg total) by mouth daily before breakfast.   lidocaine 4 % Commonly known as: HM Lidocaine Patch Apply 1 patch topically every 12 (twelve) hours as needed.   loperamide 2 MG capsule Commonly known as: IMODIUM Take 1 capsule (2 mg total) by mouth daily as needed for diarrhea or loose stools.   metoprolol succinate 25 MG 24 hr tablet Commonly known as: TOPROL-XL Take 1 tablet (25 mg total) by mouth daily. Take with or immediately following a meal.   montelukast 10 MG tablet Commonly known as: SINGULAIR Take 1 tablet (10 mg total) by mouth at bedtime.   ondansetron 8 MG disintegrating tablet Commonly known as: ZOFRAN-ODT Take 1 tablet (8 mg total) by mouth every 8 (eight) hours as needed for nausea or vomiting.   rizatriptan 10 MG tablet Commonly known as: MAXALT Take 10 mg by mouth daily as needed for migraine.   testosterone cypionate 100 MG/ML injection Commonly known as: DEPOTESTOTERONE CYPIONATE 0.4 MLS(40 MG TOTAL) INTO THE MUSCLE EVERY 10 DAYS, and syringes 1/10 days   traZODone 100 MG tablet Commonly known as: DESYREL Take 100 mg by mouth at bedtime as needed for sleep.   triamcinolone cream 0.1 % Commonly known as: KENALOG Apply 1 application topically 4 (four) times daily. As needed for itching What changed:  when to take this reasons to take this additional instructions          OBJECTIVE:   PHYSICAL EXAM: VS: BP 120/82 (BP Location: Left Arm, Patient Position: Sitting, Cuff Size: Small)   Pulse 72   Ht 5\' 3"  (1.6 m)   Wt 175 lb (79.4 kg)   SpO2 98%   BMI 31.00 kg/m     EXAM: General: Pt appears well and is in NAD  Neck: General: Supple without adenopathy. Thyroid: Thyroid size normal.  No goiter or nodules appreciated.  Lungs: Clear with good BS bilat with no rales, rhonchi, or wheezes  Heart: Auscultation: RRR.  Abdomen: Normoactive bowel sounds, soft, nontender, without masses or organomegaly palpable  Extremities:  BL LE: No pretibial edema normal ROM and strength.  Mental Status: Judgment, insight: Intact Orientation: Oriented to time, place, and person Mood and affect: No depression, anxiety, or agitation  DATA REVIEWED: ***    ASSESSMENT / PLAN / RECOMMENDATIONS:   Gender Dysphoria   - F- M   Medications   2. Postablative Hypothyroidism:  -S/P RAI ablation in 2020 - Pt tells me he is following with [facility] for this ? - Explained to the pt that he does not need two endocrinologist, and will need to pick one endocrinologist for continuity of care      Signed electronically by: Lyndle Herrlich, MD  [facility] Endocrinology  [facility] [address] [place] [place], [place] [address] Phone: [phone] FAX: [phone]      CC: Fleet Contras, MD [address] [place] [address] Phone: [phone]  Fax: [phone]   Return to Endocrinology clinic as below: No future appointments.

## 2022-06-29 ENCOUNTER — Ambulatory Visit (INDEPENDENT_AMBULATORY_CARE_PROVIDER_SITE_OTHER): Payer: Medicare Other | Admitting: Internal Medicine

## 2022-06-29 ENCOUNTER — Encounter: Payer: Self-pay | Admitting: Internal Medicine

## 2022-06-29 VITALS — BP 120/82 | HR 72 | Ht 63.0 in | Wt 175.0 lb

## 2022-06-29 DIAGNOSIS — F649 Gender identity disorder, unspecified: Secondary | ICD-10-CM | POA: Diagnosis not present

## 2022-06-29 DIAGNOSIS — F640 Transsexualism: Secondary | ICD-10-CM

## 2022-06-29 LAB — COMPREHENSIVE METABOLIC PANEL
ALT: 15 U/L (ref 0–53)
AST: 16 U/L (ref 0–37)
Albumin: 4.2 g/dL (ref 3.5–5.2)
Alkaline Phosphatase: 62 U/L (ref 39–117)
BUN: 10 mg/dL (ref 6–23)
CO2: 28 mEq/L (ref 19–32)
Calcium: 9.7 mg/dL (ref 8.4–10.5)
Chloride: 105 mEq/L (ref 96–112)
Creatinine, Ser: 1.14 mg/dL (ref 0.40–1.50)
GFR: 79.79 mL/min (ref >60.00)
Glucose, Bld: 85 mg/dL (ref 70–99)
Potassium: 3.8 mEq/L (ref 3.5–5.1)
Sodium: 139 mEq/L (ref 135–145)
Total Bilirubin: 0.8 mg/dL (ref 0.2–1.2)
Total Protein: 6.5 g/dL (ref 6.0–8.3)

## 2022-06-29 LAB — TESTOSTERONE: Testosterone: 40.81 ng/dL — ABNORMAL LOW (ref 300.00–890.00)

## 2022-06-29 LAB — CBC
HCT: 41.4 % (ref 39.0–52.0)
Hemoglobin: 14.2 g/dL (ref 13.0–17.0)
MCHC: 34.4 g/dL (ref 30.0–36.0)
MCV: 85.8 fl (ref 78.0–100.0)
Platelets: 287 10*3/uL (ref 150.0–400.0)
RBC: 4.82 Mil/uL (ref 4.22–5.81)
RDW: 14 % (ref 11.5–15.5)
WBC: 4.6 10*3/uL (ref 4.0–10.5)

## 2022-06-30 ENCOUNTER — Telehealth: Payer: Self-pay | Admitting: Internal Medicine

## 2022-06-30 MED ORDER — TESTOSTERONE CYPIONATE 200 MG/ML IJ SOLN: 50.0000 mg | INTRAMUSCULAR | 5 refills | Status: DC

## 2022-06-30 MED ORDER — "SYRINGE/NEEDLE (DISP) 22G X 1-1/2"" 1 ML MISC": 1.0000 | 3 refills | Status: DC

## 2022-06-30 NOTE — Telephone Encounter (Signed)
Attempted to contact the patient regarding lab results and received no answer. LVM for a call back.  

## 2022-06-30 NOTE — Telephone Encounter (Signed)
Please let the patient know that testosterone level is lower than it needs to be.   I have sent in a new prescription of testosterone , patient will need to do all the testosterone to 0.25 mL on the syringe   That dose needs to be taken every 14 days   Hemoglobin and kidney function are normal   Thanks

## 2022-07-01 NOTE — Telephone Encounter (Signed)
Patient informed of results and expressed understanding 

## 2022-07-09 NOTE — Telephone Encounter (Signed)
Patient called and requested that we resend Rx for Testosterone and needles. States [facility] is telling him that they did not received RX's. I gave patient information about electronic receipt but patient requested that we please resend or assist.

## 2022-07-10 MED ORDER — "SYRINGE/NEEDLE (DISP) 22G X 1-1/2"" 1 ML MISC": 1.0000 | 3 refills | Status: DC

## 2022-07-10 MED ORDER — TESTOSTERONE CYPIONATE 200 MG/ML IJ SOLN: 50.0000 mg | INTRAMUSCULAR | 5 refills | Status: DC

## 2022-07-10 NOTE — Telephone Encounter (Signed)
Spoke with [facility] and they didn't receive the script from 06/30/22 for Testosterone.

## 2022-07-10 NOTE — Addendum Note (Signed)
Addended by: Lisabeth Pick on: 07/10/2022 09:48 AM   Modules accepted: Orders

## 2022-07-10 NOTE — Telephone Encounter (Signed)
Script has been signed and manually faxed to pharmacy

## 2022-07-13 ENCOUNTER — Other Ambulatory Visit: Payer: Self-pay | Admitting: Internal Medicine

## 2022-07-14 LAB — CBC
HCT: 42.9 % (ref 38.5–50.0)
Hemoglobin: 14.5 g/dL (ref 13.2–17.1)
MCH: 29.7 pg (ref 27.0–33.0)
MCHC: 33.8 g/dL (ref 32.0–36.0)
MCV: 87.7 fL (ref 80.0–100.0)
MPV: 9.6 fL (ref 7.5–12.5)
Platelets: 320 10*3/uL (ref 140–400)
RBC: 4.89 10*6/uL (ref 4.20–5.80)
RDW: 12.9 % (ref 11.0–15.0)
WBC: 3.9 10*3/uL (ref 3.8–10.8)

## 2022-07-14 LAB — COMPLETE METABOLIC PANEL WITH GFR
AG Ratio: 2.3 (calc) (ref 1.0–2.5)
ALT: 12 U/L (ref 9–46)
AST: 13 U/L (ref 10–40)
Albumin: 4.5 g/dL (ref 3.6–5.1)
Alkaline phosphatase (APISO): 63 U/L (ref 36–130)
BUN: 10 mg/dL (ref 7–25)
CO2: 24 mmol/L (ref 20–32)
Calcium: 10 mg/dL (ref 8.6–10.3)
Chloride: 108 mmol/L (ref 98–110)
Creat: 1.1 mg/dL (ref 0.60–1.29)
Globulin: 2 g/dL (calc) (ref 1.9–3.7)
Glucose, Bld: 71 mg/dL (ref 65–99)
Potassium: 4.3 mmol/L (ref 3.5–5.3)
Sodium: 142 mmol/L (ref 135–146)
Total Bilirubin: 0.7 mg/dL (ref 0.2–1.2)
Total Protein: 6.5 g/dL (ref 6.1–8.1)
eGFR: 86 mL/min/{1.73_m2} (ref >=60)

## 2022-07-14 LAB — LIPID PANEL
Cholesterol: 209 mg/dL — ABNORMAL HIGH (ref <200)
HDL: 45 mg/dL (ref >=40)
LDL Cholesterol (Calc): 139 mg/dL (calc) — ABNORMAL HIGH
Non-HDL Cholesterol (Calc): 164 mg/dL (calc) — ABNORMAL HIGH (ref <130)
Total CHOL/HDL Ratio: 4.6 (calc) (ref <5.0)
Triglycerides: 125 mg/dL (ref <150)

## 2022-07-14 LAB — VITAMIN D 25 HYDROXY (VIT D DEFICIENCY, FRACTURES): Vit D, 25-Hydroxy: 65 ng/mL (ref 30–100)

## 2022-07-14 LAB — TSH: TSH: 0.01 mIU/L — ABNORMAL LOW (ref 0.40–4.50)

## 2022-11-30 ENCOUNTER — Encounter: Payer: Self-pay | Admitting: Internal Medicine

## 2022-11-30 ENCOUNTER — Ambulatory Visit (INDEPENDENT_AMBULATORY_CARE_PROVIDER_SITE_OTHER): Payer: Medicare Other | Admitting: Internal Medicine

## 2022-11-30 VITALS — BP 120/74 | HR 86 | Ht 63.0 in | Wt 162.0 lb

## 2022-11-30 DIAGNOSIS — D751 Secondary polycythemia: Secondary | ICD-10-CM

## 2022-11-30 DIAGNOSIS — E890 Postprocedural hypothyroidism: Secondary | ICD-10-CM | POA: Diagnosis not present

## 2022-11-30 DIAGNOSIS — F649 Gender identity disorder, unspecified: Secondary | ICD-10-CM

## 2022-11-30 LAB — CBC
HCT: 46.3 % (ref 39.0–52.0)
Hemoglobin: 15.9 g/dL (ref 13.0–17.0)
MCHC: 34.3 g/dL (ref 30.0–36.0)
MCV: 88.4 fl (ref 78.0–100.0)
Platelets: 338 10*3/uL (ref 150.0–400.0)
RBC: 5.24 Mil/uL (ref 4.22–5.81)
RDW: 14.3 % (ref 11.5–15.5)
WBC: 4.4 10*3/uL (ref 4.0–10.5)

## 2022-11-30 LAB — COMPREHENSIVE METABOLIC PANEL
ALT: 16 U/L (ref 0–53)
AST: 25 U/L (ref 0–37)
Albumin: 4.7 g/dL (ref 3.5–5.2)
Alkaline Phosphatase: 60 U/L (ref 39–117)
BUN: 10 mg/dL (ref 6–23)
CO2: 24 mEq/L (ref 19–32)
Calcium: 9.5 mg/dL (ref 8.4–10.5)
Chloride: 105 mEq/L (ref 96–112)
Creatinine, Ser: 1.23 mg/dL (ref 0.40–1.50)
GFR: 72.63 mL/min (ref >60.00)
Glucose, Bld: 105 mg/dL — ABNORMAL HIGH (ref 70–99)
Potassium: 3.3 mEq/L — ABNORMAL LOW (ref 3.5–5.1)
Sodium: 140 mEq/L (ref 135–145)
Total Bilirubin: 0.8 mg/dL (ref 0.2–1.2)
Total Protein: 7.3 g/dL (ref 6.0–8.3)

## 2022-11-30 LAB — TSH: TSH: 40.13 u[IU]/mL — ABNORMAL HIGH (ref 0.35–5.50)

## 2022-11-30 LAB — TESTOSTERONE: Testosterone: 80.68 ng/dL — ABNORMAL LOW (ref 300.00–890.00)

## 2022-11-30 NOTE — Progress Notes (Unsigned)
Name: Anthony Rowe  MRN/ DOB: [id number], 09/29/1981    Age/ Sex: 42 y.o., adult     PCP: Nolene Ebbs, MD   Reason for Endocrinology Evaluation: Gender dysphoria /hyperthyroidism     Initial Endocrinology Clinic Visit: 08/12/2018    PATIENT IDENTIFIER: [name] is a 42 y.o., adult with a past medical history of Asthma, migraine headaches . He has followed with [facility] since 08/12/2018 for consultative assistance with management of his gender dysphoria and hyperthyroidism  HISTORICAL SUMMARY: The patient was first noted with gender dysphoria at age 49. ( Male to Male ) . Puberty was at age 83.  S/P mastectomy in 2017  Gender affirming treatment started in 2015  No biological children  No hx of DVT, dyslipidemia, liver or renal disease, OSA , CAD nor CVA.  S/P hysterectomy 11/2021  HYPERTHYROID HISTORY: Pt has been diagnosed with hyperthyroidism in 07/2018 NO prior XRT exposure nor neck sx  No prior amiodarone use    Methimazole started 08/2018 Pt had an uptake and scan in 05/2019 with an elevated  24-hr uptake of 75% of I-131 S/P RAI ablation on 06/16/2019 with 12.3 mCI of I-131 Pt was started on LT-4 replacement by 09/2019 No FH of thyroid disease   Pt was seen by Dr. Loanne Drilling 10/19 until 06/2021   SUBJECTIVE:   Today (11/30/2022):  [name] is here for a follow up on gender dysphoria and postablative hypothyroidism    Last testosterone injection 3 weeks ago   Denies rash  Pt shaves once a week  Denies acne  Pending genital sx , currently on Ozempic for weight loss   Denies eye symptoms  Denies palpitations  Denies local neck swelling     HOME ENDOCRINE MEDICATIONS: Levothyroxine 175 mcg daily - takes 100 mcg  Testosterone cypionate 50 mg every 14 days        HISTORY:  Past Medical History:  Past Medical History:  Diagnosis Date   Asthma    Bronchitis    COVID-19    Endometriosis    Hypertension    Migraine     Thyroid disease    hyperthyroidism per pt report   Past Surgical History:  Past Surgical History:  Procedure Laterality Date   ANKLE SURGERY     MASTECTOMY Bilateral 2017   male to male    Social History:  reports that he has never smoked. He has never used smokeless tobacco. He reports that he does not drink alcohol and does not use drugs. Family History:  Family History  Problem Relation Age of Onset   Cancer Mother        lung   Hypertension Maternal Grandmother    Diabetes Maternal Grandmother    Thyroid disease Neg Hx      HOME MEDICATIONS: Allergies as of 11/30/2022       Reactions   Frovatriptan Other (See Comments)   Numbness in both legs   Sulfa Antibiotics Other (See Comments)   Makes patients legs numb         Medication List        Accurate as of November 30, 2022 11:05 AM. If you have any questions, ask your nurse or doctor.          albuterol 108 (90 Base) MCG/ACT inhaler Commonly known as: VENTOLIN HFA Inhale 2 puffs every 4-6 hours as needed for cough, wheeze, shortness of breath or chest tightness.   buPROPion 150 MG 24 hr tablet Commonly known as:  WELLBUTRIN XL Take 150 mg by mouth daily.   busPIRone 10 MG tablet Commonly known as: BUSPAR Take 10 mg by mouth 2 (two) times daily.   cyclobenzaprine 10 MG tablet Commonly known as: FLEXERIL Take 10 mg by mouth 2 (two) times daily as needed.   famotidine 20 MG tablet Commonly known as: Pepcid Take 1 tablet (20 mg total) by mouth 2 (two) times daily.   Flovent HFA 220 MCG/ACT inhaler Generic drug: fluticasone Inhale 2 puffs into the lungs twice daily   fluticasone 50 MCG/ACT nasal spray Commonly known as: FLONASE Place 1 spray into both nostrils daily. What changed:  when to take this reasons to take this   gabapentin 100 MG capsule Commonly known as: NEURONTIN Take 200 mg by mouth at bedtime.   hydrOXYzine 25 MG tablet Commonly known as: ATARAX Take 1-2 tablets (25-50 mg  total) by mouth at bedtime as needed. What changed: reasons to take this   levocetirizine 5 MG tablet Commonly known as: XYZAL Take 1 tablet (5 mg total) by mouth 2 (two) times daily. What changed:  when to take this reasons to take this   levothyroxine 175 MCG tablet Commonly known as: SYNTHROID Take 1 tablet (175 mcg total) by mouth daily before breakfast.   lidocaine 4 % Commonly known as: HM Lidocaine Patch Apply 1 patch topically every 12 (twelve) hours as needed.   loperamide 2 MG capsule Commonly known as: IMODIUM Take 1 capsule (2 mg total) by mouth daily as needed for diarrhea or loose stools.   metoprolol succinate 25 MG 24 hr tablet Commonly known as: TOPROL-XL Take 1 tablet (25 mg total) by mouth daily. Take with or immediately following a meal.   montelukast 10 MG tablet Commonly known as: SINGULAIR Take 1 tablet (10 mg total) by mouth at bedtime.   ondansetron 8 MG disintegrating tablet Commonly known as: ZOFRAN-ODT Take 1 tablet (8 mg total) by mouth every 8 (eight) hours as needed for nausea or vomiting.   rizatriptan 10 MG tablet Commonly known as: MAXALT Take 10 mg by mouth daily as needed for migraine.   Syringe/Needle (Disp) 22G X 1-1/2" 1 ML Misc 1 Device by Does not apply route every 14 (fourteen) days.   Testosterone Cypionate 200 MG/ML Soln Inject 50 mg as directed every 14 (fourteen) days.   traZODone 100 MG tablet Commonly known as: DESYREL Take 100 mg by mouth at bedtime as needed for sleep.   triamcinolone cream 0.1 % Commonly known as: KENALOG Apply 1 application topically 4 (four) times daily. As needed for itching What changed:  when to take this reasons to take this additional instructions          OBJECTIVE:   PHYSICAL EXAM: VS: There were no vitals taken for this visit.   EXAM: General: Pt appears well and is in NAD  Neck: General: Supple without adenopathy. Thyroid: Thyroid size normal.  No goiter or nodules  appreciated.  Lungs: Clear with good BS bilat with no rales, rhonchi, or wheezes  Heart: Auscultation: RRR.  Abdomen: Normoactive bowel sounds, soft, nontender, without masses or organomegaly palpable  Extremities:  BL LE: No pretibial edema normal ROM and strength.  Mental Status: Judgment, insight: Intact Orientation: Oriented to time, place, and person Mood and affect: No depression, anxiety, or agitation     DATA REVIEWED:  Latest Reference Range & Units 06/29/22 13:12  Sodium 135 - 145 mEq/L 139  Potassium 3.5 - 5.1 mEq/L 3.8  Chloride 96 -  112 mEq/L 105  CO2 19 - 32 mEq/L 28  Glucose 70 - 99 mg/dL 85  BUN 6 - 23 mg/dL 10  Creatinine 2.67 - 1.24 mg/dL 5.80  Calcium 8.4 - 99.8 mg/dL 9.7  Alkaline Phosphatase 39 - 117 U/L 62  Albumin 3.5 - 5.2 g/dL 4.2  AST 0 - 37 U/L 16  ALT 0 - 53 U/L 15  Total Protein 6.0 - 8.3 g/dL 6.5  Total Bilirubin 0.2 - 1.2 mg/dL 0.8  GFR >33.82 mL/min 79.79    Latest Reference Range & Units 06/29/22 13:12  WBC 4.0 - 10.5 K/uL 4.6  RBC 4.22 - 5.81 Mil/uL 4.82  Hemoglobin 13.0 - 17.0 g/dL 50.5  HCT 39.7 - 67.3 % 41.4  MCV 78.0 - 100.0 fl 85.8  MCHC 30.0 - 36.0 g/dL 41.9  RDW 37.9 - 02.4 % 14.0  Platelets 150.0 - 400.0 K/uL 287.0     ASSESSMENT / PLAN / RECOMMENDATIONS:   Gender Dysphoria   - F- M -Status post mastectomy and hysterectomy.  Patient is following up with [facility] for weight loss and preparation for gender affirming surgery -I have reviewed [place] database, the patient has not been on testosterone since earlier this year, all prescriptions have been provided by Dr. Everardo All.  No other testosterone prescriptions by any other providers -We will restart testosterone replacement therapy -Patient has history of erythrocytosis, will need to monitor and start conservatively   Medications  Start testosterone 50 mg q. 14 days IM  2. Postablative Hypothyroidism:  -S/P RAI ablation in 2020 - Pt tells me he is following with [facility] for this ? - Explained to the pt that he does not need two endocrinologist, and will need to pick one endocrinologist for continuity of care      Signed electronically by: Lyndle Herrlich, MD  [facility] Endocrinology  [facility] [address] [place] [place], [place] [address] Phone: [phone] FAX: [phone]      CC: Fleet Contras, MD [address] [place] [address] Phone: [phone]  Fax: [phone]   Return to Endocrinology clinic as below: Future Appointments  Date Time Provider Department Center  11/30/2022  1:20 PM Cobain Morici, Konrad Dolores, MD [id number] None

## 2022-12-01 ENCOUNTER — Telehealth: Payer: Self-pay | Admitting: Internal Medicine

## 2022-12-01 MED ORDER — LEVOTHYROXINE SODIUM 137 MCG PO TABS: 137.0000 ug | ORAL_TABLET | Freq: Every day | ORAL | 2 refills | Status: DC

## 2022-12-01 MED ORDER — TESTOSTERONE CYPIONATE 200 MG/ML IJ SOLN: 50.0000 mg | INTRAMUSCULAR | 5 refills | Status: DC

## 2022-12-01 NOTE — Telephone Encounter (Signed)
Please let the patient know that his thyroid test is extremely abnormal, this is due to low levothyroxine dose   His TSH needs to be less than 4 and is currently at 40   Please asked the patient to stop levothyroxine 100 and start levothyroxine 137 mcg daily  Testosterone is better, continue current dose of testosterone injections

## 2022-12-01 NOTE — Telephone Encounter (Signed)
Patient advised and will start new dose of medication

## 2022-12-06 ENCOUNTER — Other Ambulatory Visit: Payer: Self-pay

## 2022-12-06 ENCOUNTER — Ambulatory Visit (HOSPITAL_COMMUNITY)
Admission: EM | Admit: 2022-12-06 | Discharge: 2022-12-06 | Disposition: A | Discharge status: Home / Self Care | Payer: Medicare Other | Attending: Internal Medicine | Admitting: Internal Medicine

## 2022-12-06 ENCOUNTER — Encounter (HOSPITAL_COMMUNITY): Payer: Self-pay | Admitting: *Deleted

## 2022-12-06 DIAGNOSIS — N3001 Acute cystitis with hematuria: Secondary | ICD-10-CM | POA: Diagnosis present

## 2022-12-06 LAB — POCT URINALYSIS DIPSTICK, ED / UC
Bilirubin Urine: NEGATIVE
Glucose, UA: NEGATIVE mg/dL
Nitrite: NEGATIVE
Protein, ur: 100 mg/dL — AB
Specific Gravity, Urine: >=1.03 (ref 1.005–1.030)
Urobilinogen, UA: 0.2 mg/dL (ref 0.0–1.0)
pH: 6 (ref 5.0–8.0)

## 2022-12-06 MED ORDER — CEPHALEXIN 500 MG PO CAPS: 500.0000 mg | ORAL_CAPSULE | Freq: Two times a day (BID) | ORAL | 0 refills | Status: AC

## 2022-12-06 NOTE — ED Triage Notes (Signed)
Pt reports dysuria for 3 days.

## 2022-12-06 NOTE — Discharge Instructions (Addendum)
Your urine shows you likely have a urinary tract infection. I have sent your urine for culture to confirm this. We will go ahead and have you start taking antibiotics due to your symptoms.  Take antibiotic as directed.  (Keflex 500mg every 12 hours for 7 days) If you develop diarrhea while taking this medication you may purchase an over-the-counter probiotic or eat yogurt with live active cultures.  To avoid GI upset please take this medication with food. I have sent your urine for culture to see what type of bacteria grows. We will call you if we need to change the treatment plan based on the results of your urine culture.  If you develop any new or worsening symptoms or do not improve in the next 2 to 3 days, please return.  If your symptoms are severe, please go to the emergency room.  Follow-up with your primary care provider for further evaluation and management of your symptoms as well as ongoing wellness visits.  I hope you feel better!  

## 2022-12-06 NOTE — ED Provider Notes (Signed)
[facility]    CSN: [id number] Arrival date & time: 12/06/22  1007      History   Chief Complaint Chief Complaint  Patient presents with   Dysuria    HPI Anthony Rowe is a 42 y.o. adult.   Patient with history of gender dysphoria and male to male transition presents urgent care for evaluation of dysuria, urinary frequency, and urinary urgency for the last 3 days.  Denies recent antibiotic or steroid use.  He states he does not have frequent urinary tract infections.  Denies vaginal symptoms, recent new sexual partners, and concern for STD.  Denies chance of pregnancy.  Denies gross hematuria, nausea, vomiting, flank pain, low back pain, and suprapubic abdominal discomfort.  History of hysterectomy.  He has not attempted use of any over-the-counter medications prior to arrival at urgent care for symptoms.   Dysuria    Past Medical History:  Diagnosis Date   Asthma    Bronchitis    COVID-19    Endometriosis    Hypertension    Migraine    Thyroid disease    hyperthyroidism per pt report    Patient Active Problem List   Diagnosis Date Noted   Gender dysphoria 06/29/2022   Acute respiratory disease due to COVID-19 virus 02/10/2020   Depression with anxiety 02/10/2020   Obesity (BMI 30.0-34.9) 02/10/2020   Polycythemia 12/20/2019   Hypothyroidism 11/06/2019   Hypokalemia 05/29/2019   Migraine 08/12/2018   HTN (hypertension) 08/12/2018   Asthma 08/12/2018   Male-to-male transgender person 08/12/2018   Gender identity disorder 08/19/2016   Left ankle pain 09/25/2013   Hypovitaminosis D 09/25/2013    Past Surgical History:  Procedure Laterality Date   ANKLE SURGERY     MASTECTOMY Bilateral 2017   male to male     OB History   No obstetric history on file.      Home Medications    Prior to Admission medications   Medication Sig Start Date End Date Taking? Authorizing Provider  cephALEXin (KEFLEX) 500 MG capsule Take 1 capsule (500 mg  total) by mouth 2 (two) times daily for 7 days. 12/06/22 12/13/22 Yes Carlisle Beers, FNP  albuterol (VENTOLIN HFA) 108 (90 Base) MCG/ACT inhaler Inhale 2 puffs every 4-6 hours as needed for cough, wheeze, shortness of breath or chest tightness. 05/19/20   Fayrene Helper, PA-C  buPROPion (WELLBUTRIN XL) 150 MG 24 hr tablet Take 150 mg by mouth daily. 12/05/19   [provider]  busPIRone (BUSPAR) 10 MG tablet Take 10 mg by mouth 2 (two) times daily. 11/14/19   [provider]  cyclobenzaprine (FLEXERIL) 10 MG tablet Take 10 mg by mouth 2 (two) times daily as needed.  01/25/13   [provider]  famotidine (PEPCID) 20 MG tablet Take 1 tablet (20 mg total) by mouth 2 (two) times daily. 07/26/19   Marcelyn Bruins, MD  fluticasone (FLONASE) 50 MCG/ACT nasal spray Place 1 spray into both nostrils daily. Patient taking differently: Place 1 spray into both nostrils daily as needed for allergies. 07/14/19   Georgetta Haber, NP  fluticasone (FLOVENT HFA) 220 MCG/ACT inhaler Inhale 2 puffs into the lungs twice daily 02/09/20   Marcelyn Bruins, MD  gabapentin (NEURONTIN) 100 MG capsule Take 200 mg by mouth at bedtime.  01/10/20   [provider]  hydrOXYzine (ATARAX/VISTARIL) 25 MG tablet Take 1-2 tablets (25-50 mg total) by mouth at bedtime as needed. Patient taking differently: Take 25-50 mg by mouth  at bedtime as needed for anxiety or vomiting. 07/26/19   Kennith Gain, MD  levocetirizine (XYZAL) 5 MG tablet Take 1 tablet (5 mg total) by mouth 2 (two) times daily. Patient taking differently: Take 5 mg by mouth daily as needed for allergies. 03/30/19   Kennith Gain, MD  levothyroxine (SYNTHROID) 137 MCG tablet Take 1 tablet (137 mcg total) by mouth daily before breakfast. 12/01/22   Shamleffer, Melanie Crazier, MD  Lidocaine (HM LIDOCAINE PATCH) 4 % PTCH Apply 1 patch topically every 12 (twelve) hours as needed. 12/17/21   Loni Beckwith, PA-C  loperamide (IMODIUM) 2 MG capsule Take 1 capsule (2 mg total) by mouth daily as needed for diarrhea or loose stools. 07/18/19   Jaynee Eagles, PA-C  metoprolol succinate (TOPROL-XL) 25 MG 24 hr tablet Take 1 tablet (25 mg total) by mouth daily. Take with or immediately following a meal. 05/19/20   Domenic Moras, PA-C  montelukast (SINGULAIR) 10 MG tablet Take 1 tablet (10 mg total) by mouth at bedtime. 07/26/19   Kennith Gain, MD  ondansetron (ZOFRAN-ODT) 8 MG disintegrating tablet Take 1 tablet (8 mg total) by mouth every 8 (eight) hours as needed for nausea or vomiting. 07/18/19   Jaynee Eagles, PA-C  rizatriptan (MAXALT) 10 MG tablet Take 10 mg by mouth daily as needed for migraine.  01/02/20   [provider]  Syringe/Needle, Disp, 22G X 1-1/2" 1 ML MISC 1 Device by Does not apply route every 14 (fourteen) days. 07/10/22   Shamleffer, Melanie Crazier, MD  Testosterone Cypionate 200 MG/ML SOLN Inject 50 mg as directed every 14 (fourteen) days. 12/01/22   Shamleffer, Melanie Crazier, MD  traZODone (DESYREL) 100 MG tablet Take 100 mg by mouth at bedtime as needed for sleep.     [provider]  triamcinolone cream (KENALOG) 0.1 % Apply 1 application topically 4 (four) times daily. As needed for itching Patient taking differently: Apply 1 application  topically 3 (three) times daily as needed (for rash). 07/26/19   Renato Shin, MD    Family History Family History  Problem Relation Age of Onset   Cancer Mother        lung   Hypertension Maternal Grandmother    Diabetes Maternal Grandmother    Thyroid disease Neg Hx     Social History Social History   Tobacco Use   Smoking status: Never   Smokeless tobacco: Never  Vaping Use   Vaping Use: Never used  Substance Use Topics   Alcohol use: No   Drug use: No     Allergies   Frovatriptan and Sulfa antibiotics   Review of Systems Review of Systems  Genitourinary:  Positive for dysuria.  Per  HPI   Physical Exam Triage Vital Signs ED Triage Vitals  Enc Vitals Group     BP 12/06/22 1044 (!) 141/96     Pulse Rate 12/06/22 1044 62     Resp 12/06/22 1044 18     Temp 12/06/22 1044 98.5 F (36.9 C)     Temp src --      SpO2 12/06/22 1044 99 %     Weight --      Height --      Head Circumference --      Peak Flow --      Pain Score 12/06/22 1042 8     Pain Loc --      Pain Edu? --      Excl. in [place]? --  No data found.  Updated Vital Signs BP (!) 141/96   Pulse 62   Temp 98.5 F (36.9 C)   Resp 18   SpO2 99%   Visual Acuity Right Eye Distance:   Left Eye Distance:   Bilateral Distance:    Right Eye Near:   Left Eye Near:    Bilateral Near:     Physical Exam Vitals and nursing note reviewed.  Constitutional:      Appearance: He is not ill-appearing or toxic-appearing.  HENT:     Head: Normocephalic and atraumatic.     Right Ear: Hearing and external ear normal.     Left Ear: Hearing and external ear normal.     Nose: Nose normal.     Mouth/Throat:     Lips: Pink.  Eyes:     General: Lids are normal. Vision grossly intact. Gaze aligned appropriately.     Extraocular Movements: Extraocular movements intact.     Conjunctiva/sclera: Conjunctivae normal.  Cardiovascular:     Rate and Rhythm: Normal rate and regular rhythm.     Heart sounds: Normal heart sounds, S1 normal and S2 normal.  Pulmonary:     Effort: Pulmonary effort is normal. No respiratory distress.     Breath sounds: Normal breath sounds and air entry.  Abdominal:     General: Bowel sounds are normal.     Palpations: Abdomen is soft.     Tenderness: There is no abdominal tenderness. There is no right CVA tenderness, left CVA tenderness or guarding.  Musculoskeletal:     Cervical back: Neck supple.  Skin:    General: Skin is warm and dry.     Capillary Refill: Capillary refill takes less than 2 seconds.     Findings: No rash.  Neurological:     General: No focal deficit present.      Mental Status: He is alert and oriented to person, place, and time. Mental status is at baseline.     Cranial Nerves: No dysarthria or facial asymmetry.  Psychiatric:        Mood and Affect: Mood normal.        Speech: Speech normal.        Behavior: Behavior normal.        Thought Content: Thought content normal.        Judgment: Judgment normal.      UC Treatments / Results  Labs (all labs ordered are listed, but only abnormal results are displayed) Labs Reviewed  POCT URINALYSIS DIPSTICK, ED / UC - Abnormal; Notable for the following components:      Result Value   Ketones, ur TRACE (*)    Hgb urine dipstick TRACE (*)    Protein, ur 100 (*)    Leukocytes,Ua TRACE (*)    All other components within normal limits  URINE CULTURE  POCT URINALYSIS DIPSTICK, ED / UC    EKG   Radiology No results found.  Procedures Procedures (including critical care time)  Medications Ordered in UC Medications - No data to display  Initial Impression / Assessment and Plan / UC Course  I have reviewed the triage vital signs and the nursing notes.  Pertinent labs & imaging results that were available during my care of the patient were reviewed by me and considered in my medical decision making (see chart for details).   1. Acute cystitis with hematuria Presentation is consistent with acute uncomplicated cystitis. Patient is nontoxic in appearance with hemodynamically stable vital signs. Low suspicion  for acute pyelonephritis. Low suspicion for kidney stone or infected stone. No indication for labs or imaging at this time.  Keflex sent to pharmacy. Denies allergies to antibiotics. Urine culture pending. Patient to push fluids to stay well hydrated and reduce intake of known urinary irritants.  Discussed physical exam and available lab work findings in clinic with patient.  Counseled patient regarding appropriate use of medications and potential side effects for all medications recommended  or prescribed today. Discussed red flag signs and symptoms of worsening condition,when to call the PCP office, return to urgent care, and when to seek higher level of care in the emergency department. Patient verbalizes understanding and agreement with plan. All questions answered. Patient discharged in stable condition.    Final Clinical Impressions(s) / UC Diagnoses   Final diagnoses:  Acute cystitis without hematuria     Discharge Instructions      Your urine shows you likely have a urinary tract infection. I have sent your urine for culture to confirm this. We will go ahead and have you start taking antibiotics due to your symptoms.  Take antibiotic as directed.  (Keflex 500mg  every 12 hours for 7 days) If you develop diarrhea while taking this medication you may purchase an over-the-counter probiotic or eat yogurt with live active cultures.  To avoid GI upset please take this medication with food. I have sent your urine for culture to see what type of bacteria grows. We will call you if we need to change the treatment plan based on the results of your urine culture.  If you develop any new or worsening symptoms or do not improve in the next 2 to 3 days, please return.  If your symptoms are severe, please go to the emergency room.  Follow-up with your primary care provider for further evaluation and management of your symptoms as well as ongoing wellness visits.  I hope you feel better!    ED Prescriptions     Medication Sig Dispense Auth. Provider   cephALEXin (KEFLEX) 500 MG capsule Take 1 capsule (500 mg total) by mouth 2 (two) times daily for 7 days. 14 capsule Talbot Grumbling, FNP      PDMP not reviewed this encounter.   Talbot Grumbling, [place] 12/06/22 1132

## 2022-12-07 LAB — URINE CULTURE: Culture: <10000 — AB

## 2022-12-09 ENCOUNTER — Ambulatory Visit (HOSPITAL_COMMUNITY)
Admission: RE | Admit: 2022-12-09 | Discharge: 2022-12-09 | Disposition: A | Discharge status: Home / Self Care | Payer: Medicare Other | Source: Ambulatory Visit | Attending: Family Medicine | Admitting: Family Medicine

## 2022-12-09 ENCOUNTER — Encounter (HOSPITAL_COMMUNITY): Payer: Self-pay

## 2022-12-09 VITALS — BP 125/101 | HR 72 | Temp 98.2°F | Resp 16 | Ht 64.0 in | Wt 158.0 lb

## 2022-12-09 DIAGNOSIS — J069 Acute upper respiratory infection, unspecified: Secondary | ICD-10-CM | POA: Diagnosis not present

## 2022-12-09 DIAGNOSIS — J45909 Unspecified asthma, uncomplicated: Secondary | ICD-10-CM | POA: Diagnosis not present

## 2022-12-09 DIAGNOSIS — N3001 Acute cystitis with hematuria: Secondary | ICD-10-CM | POA: Insufficient documentation

## 2022-12-09 DIAGNOSIS — Z8616 Personal history of COVID-19: Secondary | ICD-10-CM | POA: Diagnosis not present

## 2022-12-09 DIAGNOSIS — R300 Dysuria: Secondary | ICD-10-CM | POA: Diagnosis present

## 2022-12-09 DIAGNOSIS — H6121 Impacted cerumen, right ear: Secondary | ICD-10-CM | POA: Diagnosis not present

## 2022-12-09 LAB — POCT URINALYSIS DIPSTICK, ED / UC
Glucose, UA: 100 mg/dL — AB
Hgb urine dipstick: NEGATIVE
Ketones, ur: 15 mg/dL — AB
Leukocytes,Ua: NEGATIVE
Nitrite: POSITIVE — AB
Protein, ur: 30 mg/dL — AB
Specific Gravity, Urine: 1.025 (ref 1.005–1.030)
Urobilinogen, UA: 2 mg/dL — ABNORMAL HIGH (ref 0.0–1.0)
pH: 5.5 (ref 5.0–8.0)

## 2022-12-09 MED ORDER — NITROFURANTOIN MONOHYD MACRO 100 MG PO CAPS: 100.0000 mg | ORAL_CAPSULE | Freq: Two times a day (BID) | ORAL | 0 refills | Status: DC

## 2022-12-09 MED ORDER — ALBUTEROL SULFATE HFA 108 (90 BASE) MCG/ACT IN AERS: 1.0000 | INHALATION_SPRAY | Freq: Four times a day (QID) | RESPIRATORY_TRACT | 0 refills | Status: AC | PRN

## 2022-12-09 NOTE — Discharge Instructions (Addendum)
Stop taking Keflex antibiotic and start taking Macrobid twice daily for the next 5 days.  I have sent your urine for culture again.  I will call you if this changes the treatment plan.  Continue to drink plenty of water to flush out your kidneys and stay well-hydrated.  I tested you for COVID today.  We will call you in the next 24 hours if this is positive.  If your test is negative, you will not hear from [place]  You may take over-the-counter medications to help with head cold symptoms. You may use albuterol inhaler sent to pharmacy as needed for cough, shortness of breath, and wheeze if you develop these types of symptoms while ill.  Use Debrox over-the-counter eardrops to the right ear to remove earwax.  Stop using Q-tips to the ears!   If you develop any new or worsening symptoms or do not improve in the next 2 to 3 days, please return.  If your symptoms are severe, please go to the emergency room.  Follow-up with your primary care provider for further evaluation and management of your symptoms as well as ongoing wellness visits.  I hope you feel better!

## 2022-12-09 NOTE — ED Triage Notes (Signed)
Patient returning to follow up on UTI. States the antibiotics were not working.   Sore throat and ear pain are new. Onset today

## 2022-12-10 ENCOUNTER — Telehealth (HOSPITAL_COMMUNITY): Payer: Self-pay | Admitting: Emergency Medicine

## 2022-12-10 LAB — URINE CULTURE: Culture: NO GROWTH

## 2022-12-10 LAB — SARS CORONAVIRUS 2 (TAT 6-24 HRS): SARS Coronavirus 2: NEGATIVE

## 2022-12-10 LAB — CERVICOVAGINAL ANCILLARY ONLY
Bacterial Vaginitis (gardnerella): NEGATIVE
Candida Glabrata: NEGATIVE
Candida Vaginitis: POSITIVE — AB
Chlamydia: NEGATIVE
Comment: NEGATIVE
Comment: NEGATIVE
Comment: NEGATIVE
Comment: NEGATIVE
Comment: NEGATIVE
Comment: NORMAL
Neisseria Gonorrhea: NEGATIVE
Trichomonas: NEGATIVE

## 2022-12-10 MED ORDER — FLUCONAZOLE 150 MG PO TABS: 150.0000 mg | ORAL_TABLET | Freq: Once | ORAL | 0 refills | Status: AC

## 2022-12-12 ENCOUNTER — Other Ambulatory Visit: Payer: Self-pay

## 2022-12-12 ENCOUNTER — Telehealth (HOSPITAL_COMMUNITY): Payer: Self-pay | Admitting: *Deleted

## 2022-12-12 ENCOUNTER — Encounter (HOSPITAL_COMMUNITY): Payer: Self-pay

## 2022-12-12 ENCOUNTER — Emergency Department (HOSPITAL_COMMUNITY)
Admission: EM | Admit: 2022-12-12 | Discharge: 2022-12-12 | Disposition: A | Discharge status: Home / Self Care | Payer: Medicare Other | Attending: Emergency Medicine | Admitting: Emergency Medicine

## 2022-12-12 DIAGNOSIS — E039 Hypothyroidism, unspecified: Secondary | ICD-10-CM | POA: Diagnosis not present

## 2022-12-12 DIAGNOSIS — Z79899 Other long term (current) drug therapy: Secondary | ICD-10-CM | POA: Insufficient documentation

## 2022-12-12 DIAGNOSIS — Z7951 Long term (current) use of inhaled steroids: Secondary | ICD-10-CM | POA: Insufficient documentation

## 2022-12-12 DIAGNOSIS — R102 Pelvic and perineal pain: Secondary | ICD-10-CM | POA: Diagnosis present

## 2022-12-12 DIAGNOSIS — N3001 Acute cystitis with hematuria: Secondary | ICD-10-CM | POA: Diagnosis not present

## 2022-12-12 DIAGNOSIS — J45909 Unspecified asthma, uncomplicated: Secondary | ICD-10-CM | POA: Diagnosis not present

## 2022-12-12 DIAGNOSIS — I10 Essential (primary) hypertension: Secondary | ICD-10-CM | POA: Insufficient documentation

## 2022-12-12 DIAGNOSIS — L259 Unspecified contact dermatitis, unspecified cause: Secondary | ICD-10-CM

## 2022-12-12 LAB — URINALYSIS, W/ REFLEX TO CULTURE (INFECTION SUSPECTED)
Bacteria, UA: NONE SEEN
Bilirubin Urine: NEGATIVE
Glucose, UA: NEGATIVE mg/dL
Hgb urine dipstick: NEGATIVE
Ketones, ur: 5 mg/dL — AB
Leukocytes,Ua: NEGATIVE
Nitrite: POSITIVE — AB
Protein, ur: NEGATIVE mg/dL
Specific Gravity, Urine: 1.017 (ref 1.005–1.030)
pH: 6 (ref 5.0–8.0)

## 2022-12-12 MED ORDER — NYSTATIN 100000 UNIT/GM EX CREA: TOPICAL_CREAM | CUTANEOUS | 0 refills | Status: DC

## 2022-12-12 MED ORDER — NITROFURANTOIN MONOHYD MACRO 100 MG PO CAPS: 100.0000 mg | ORAL_CAPSULE | Freq: Two times a day (BID) | ORAL | 0 refills | Status: DC

## 2022-12-12 NOTE — Discharge Instructions (Addendum)
You were seen in the ER for evaluation of your vaginal and buttock pain. It appears that the skin is very dry and irritated which is causing you pain. I recommend applying the Desitin cream as needed. Additionally, I am sending you home with a cream to place on initially before the desitin to help with any yeast growth.  Please make sure your skin is dry before applying. Additionally, your urine showed signs of infection, so I am placing you on an antibiotic. Please follow up with your PCP within the next few days. If you have any concerns, new or worsening symptoms, please return to the nearest ER for re-evaluation. You can take Tylenol or ibuprofen as needed for pain.   Contact a doctor if: You do not get better with treatment. You get worse. You have signs of infection. You have a fever. You have new symptoms. Your bone or joint near the area hurts after the skin has healed. Get help right away if: You see red streaks coming from the area. The area turns darker. You have trouble breathing.

## 2022-12-12 NOTE — ED Triage Notes (Signed)
Reports was initially diagnosed with UTI but then called back and told it was a yeast infection and prescribed diflucan.  Reports the rash and irritation has now spread to legs and butt crack.  Reports it is swollen and hurts to urinate.

## 2022-12-12 NOTE — ED Provider Notes (Signed)
[facility] Provider Note   CSN: [id number] Arrival date & time: 12/12/22  1606     History {Add pertinent medical, surgical, social history, OB history to HPI:1} Chief Complaint  Patient presents with   Vaginitis    Anthony Rowe is a 42 y.o. adult.  HPI     Home Medications Prior to Admission medications   Medication Sig Start Date End Date Taking? Authorizing Provider  albuterol (VENTOLIN HFA) 108 (90 Base) MCG/ACT inhaler Inhale 1-2 puffs into the lungs every 6 (six) hours as needed for wheezing or shortness of breath. 12/09/22   Talbot Grumbling, FNP  buPROPion (WELLBUTRIN XL) 150 MG 24 hr tablet Take 150 mg by mouth daily. 12/05/19   [provider]  busPIRone (BUSPAR) 10 MG tablet Take 10 mg by mouth 2 (two) times daily. 11/14/19   [provider]  cephALEXin (KEFLEX) 500 MG capsule Take 1 capsule (500 mg total) by mouth 2 (two) times daily for 7 days. 12/06/22 12/13/22  Talbot Grumbling, FNP  cyclobenzaprine (FLEXERIL) 10 MG tablet Take 10 mg by mouth 2 (two) times daily as needed.  01/25/13   [provider]  famotidine (PEPCID) 20 MG tablet Take 1 tablet (20 mg total) by mouth 2 (two) times daily. 07/26/19   Kennith Gain, MD  fluticasone (FLONASE) 50 MCG/ACT nasal spray Place 1 spray into both nostrils daily. Patient taking differently: Place 1 spray into both nostrils daily as needed for allergies. 07/14/19   Zigmund Gottron, NP  fluticasone (FLOVENT HFA) 220 MCG/ACT inhaler Inhale 2 puffs into the lungs twice daily 02/09/20   Kennith Gain, MD  gabapentin (NEURONTIN) 100 MG capsule Take 200 mg by mouth at bedtime.  01/10/20   [provider]  hydrOXYzine (ATARAX/VISTARIL) 25 MG tablet Take 1-2 tablets (25-50 mg total) by mouth at bedtime as needed. Patient taking differently: Take 25-50 mg by mouth at bedtime as needed for anxiety or vomiting. 07/26/19   Kennith Gain, MD  levocetirizine (XYZAL) 5 MG tablet Take 1 tablet (5 mg total) by mouth 2 (two) times daily. Patient taking differently: Take 5 mg by mouth daily as needed for allergies. 03/30/19   Kennith Gain, MD  levothyroxine (SYNTHROID) 137 MCG tablet Take 1 tablet (137 mcg total) by mouth daily before breakfast. 12/01/22   Shamleffer, Melanie Crazier, MD  Lidocaine (HM LIDOCAINE PATCH) 4 % PTCH Apply 1 patch topically every 12 (twelve) hours as needed. 12/17/21   Loni Beckwith, PA-C  loperamide (IMODIUM) 2 MG capsule Take 1 capsule (2 mg total) by mouth daily as needed for diarrhea or loose stools. 07/18/19   Jaynee Eagles, PA-C  metoprolol succinate (TOPROL-XL) 25 MG 24 hr tablet Take 1 tablet (25 mg total) by mouth daily. Take with or immediately following a meal. 05/19/20   Domenic Moras, PA-C  montelukast (SINGULAIR) 10 MG tablet Take 1 tablet (10 mg total) by mouth at bedtime. 07/26/19   Kennith Gain, MD  nitrofurantoin, macrocrystal-monohydrate, (MACROBID) 100 MG capsule Take 1 capsule (100 mg total) by mouth 2 (two) times daily. 12/09/22   Talbot Grumbling, FNP  ondansetron (ZOFRAN-ODT) 8 MG disintegrating tablet Take 1 tablet (8 mg total) by mouth every 8 (eight) hours as needed for nausea or vomiting. 07/18/19   Jaynee Eagles, PA-C  rizatriptan (MAXALT) 10 MG tablet Take 10 mg by mouth daily as needed for migraine.  01/02/20   [provider]  Syringe/Needle, Disp, 22G X 1-1/2" 1 ML MISC 1 Device by Does not apply route every 14 (fourteen) days. 07/10/22   Shamleffer, Melanie Crazier, MD  Testosterone Cypionate 200 MG/ML SOLN Inject 50 mg as directed every 14 (fourteen) days. 12/01/22   Shamleffer, Melanie Crazier, MD  traZODone (DESYREL) 100 MG tablet Take 100 mg by mouth at bedtime as needed for sleep.     [provider]  triamcinolone cream (KENALOG) 0.1 % Apply 1 application topically 4 (four) times daily. As needed for itching Patient  taking differently: Apply 1 application  topically 3 (three) times daily as needed (for rash). 07/26/19   Renato Shin, MD      Allergies    Frovatriptan and Sulfa antibiotics    Review of Systems   Review of Systems  Physical Exam Updated Vital Signs BP 128/63 (BP Location: Right Arm)   Pulse (!) 51   Temp 98.9 F (37.2 C) (Oral)   Resp 16   Ht 5' 4"$  (1.626 m)   Wt 71.7 kg   SpO2 100%   BMI 27.12 kg/m  Physical Exam  ED Results / Procedures / Treatments   Labs (all labs ordered are listed, but only abnormal results are displayed) Labs Reviewed  URINALYSIS, W/ REFLEX TO CULTURE (INFECTION SUSPECTED)    EKG None  Radiology No results found.  Procedures Procedures  {Document cardiac monitor, telemetry assessment procedure when appropriate:1}  Medications Ordered in ED Medications - No data to display  ED Course/ Medical Decision Making/ A&P   {   Click here for ABCD2, HEART and other calculatorsREFRESH Note before signing :1}                          Medical Decision Making  ***  {Document critical care time when appropriate:1} {Document review of labs and clinical decision tools ie heart score, Chads2Vasc2 etc:1}  {Document your independent review of radiology images, and any outside records:1} {Document your discussion with family members, caretakers, and with consultants:1} {Document social determinants of health affecting pt's care:1} {Document your decision making why or why not admission, treatments were needed:1} Final Clinical Impression(s) / ED Diagnoses Final diagnoses:  None    Rx / DC Orders ED Discharge Orders     None

## 2022-12-13 NOTE — ED Provider Notes (Signed)
[place]    CSN: [id number] Arrival date & time: 12/09/22  [id number]      History   Chief Complaint Chief Complaint  Patient presents with   Urinary Frequency    I was there this week y'all called me to come back in. - Entered by patient   Sore Throat   Otalgia    HPI Marston Burditt is a 42 y.o. adult.   Patient presents to urgent care for evaluation of ongoing urinary symptoms after starting keflex for UTI 3 days ago. He continues to report dysuria and urinary frequency. He has been taking antibiotics as prescribed and has not had any improvement in symptoms over the last 3 days. Denies vaginal discharge, itching, odor, and rash. No recent new sexual partners or concern for STD. He is allergic to sulfa antibiotics, denies other allergies. He denies fever, chills, nausea, vomiting, abdominal pain, urinary urgency, gross hematuria, flank pain, dizziness, and diarrhea.  Denies dyspareunia.  He would also like to be evaluated for cough, sore throat, nasal congestion and ear pain that started 2 days ago. Believes he may have been exposed to illness via his kids, no other known sick contacts with similar symptoms. Continues to deny fever, chills, and body aches.  Patient has a history of asthma that is well-controlled with as needed use of albuterol inhaler.  He has not been experiencing any noisy breathing, shortness of breath, chest pain, wheezing, or heart palpitations.  Sore throat is worsened with swallowing.  Maintaining secretions well.  Reports bilateral ear discomfort.  Denies recent steroid use.  Has not attempted use of any over-the-counter medications to help with cough and cold symptoms before coming to urgent care.   Urinary Frequency  Sore Throat  Otalgia   Past Medical History:  Diagnosis Date   Asthma    Bronchitis    COVID-19    Endometriosis    Hypertension    Migraine    Thyroid disease    hyperthyroidism per pt report    Patient Active Problem  List   Diagnosis Date Noted   Gender dysphoria 06/29/2022   Acute respiratory disease due to COVID-19 virus 02/10/2020   Depression with anxiety 02/10/2020   Obesity (BMI 30.0-34.9) 02/10/2020   Polycythemia 12/20/2019   Hypothyroidism 11/06/2019   Hypokalemia 05/29/2019   Migraine 08/12/2018   HTN (hypertension) 08/12/2018   Asthma 08/12/2018   Male-to-male transgender person 08/12/2018   Gender identity disorder 08/19/2016   Left ankle pain 09/25/2013   Hypovitaminosis D 09/25/2013    Past Surgical History:  Procedure Laterality Date   ABDOMINAL HYSTERECTOMY     ANKLE SURGERY     MASTECTOMY Bilateral 2017   male to male     OB History   No obstetric history on file.      Home Medications    Prior to Admission medications   Medication Sig Start Date End Date Taking? Authorizing Provider  albuterol (VENTOLIN HFA) 108 (90 Base) MCG/ACT inhaler Inhale 1-2 puffs into the lungs every 6 (six) hours as needed for wheezing or shortness of breath. 12/09/22  Yes Talbot Grumbling, FNP  buPROPion (WELLBUTRIN XL) 150 MG 24 hr tablet Take 150 mg by mouth daily. 12/05/19  Yes [provider]  busPIRone (BUSPAR) 10 MG tablet Take 10 mg by mouth 2 (two) times daily. 11/14/19  Yes [provider]  cephALEXin (KEFLEX) 500 MG capsule Take 1 capsule (500 mg total) by mouth 2 (two) times daily for 7 days. 12/06/22  12/13/22 Yes Talbot Grumbling, FNP  hydrOXYzine (ATARAX/VISTARIL) 25 MG tablet Take 1-2 tablets (25-50 mg total) by mouth at bedtime as needed. Patient taking differently: Take 25-50 mg by mouth at bedtime as needed for anxiety or vomiting. 07/26/19  Yes Padgett, Rae Halsted, MD  levocetirizine (XYZAL) 5 MG tablet Take 1 tablet (5 mg total) by mouth 2 (two) times daily. Patient taking differently: Take 5 mg by mouth daily as needed for allergies. 03/30/19  Yes Padgett, Rae Halsted, MD  levothyroxine (SYNTHROID) 137 MCG tablet Take 1 tablet (137 mcg  total) by mouth daily before breakfast. 12/01/22  Yes Shamleffer, Melanie Crazier, MD  metoprolol succinate (TOPROL-XL) 25 MG 24 hr tablet Take 1 tablet (25 mg total) by mouth daily. Take with or immediately following a meal. 05/19/20  Yes Domenic Moras, PA-C  ondansetron (ZOFRAN-ODT) 8 MG disintegrating tablet Take 1 tablet (8 mg total) by mouth every 8 (eight) hours as needed for nausea or vomiting. 07/18/19  Yes Jaynee Eagles, PA-C  Syringe/Needle, Disp, 22G X 1-1/2" 1 ML MISC 1 Device by Does not apply route every 14 (fourteen) days. 07/10/22  Yes Shamleffer, Melanie Crazier, MD  Testosterone Cypionate 200 MG/ML SOLN Inject 50 mg as directed every 14 (fourteen) days. 12/01/22  Yes Shamleffer, Melanie Crazier, MD  traZODone (DESYREL) 100 MG tablet Take 100 mg by mouth at bedtime as needed for sleep.    Yes [provider]  cyclobenzaprine (FLEXERIL) 10 MG tablet Take 10 mg by mouth 2 (two) times daily as needed.  01/25/13   [provider]  famotidine (PEPCID) 20 MG tablet Take 1 tablet (20 mg total) by mouth 2 (two) times daily. 07/26/19   Kennith Gain, MD  fluticasone (FLONASE) 50 MCG/ACT nasal spray Place 1 spray into both nostrils daily. Patient taking differently: Place 1 spray into both nostrils daily as needed for allergies. 07/14/19   Zigmund Gottron, NP  fluticasone (FLOVENT HFA) 220 MCG/ACT inhaler Inhale 2 puffs into the lungs twice daily 02/09/20   Kennith Gain, MD  gabapentin (NEURONTIN) 100 MG capsule Take 200 mg by mouth at bedtime.  01/10/20   [provider]  Lidocaine (HM LIDOCAINE PATCH) 4 % PTCH Apply 1 patch topically every 12 (twelve) hours as needed. 12/17/21   Loni Beckwith, PA-C  loperamide (IMODIUM) 2 MG capsule Take 1 capsule (2 mg total) by mouth daily as needed for diarrhea or loose stools. 07/18/19   Jaynee Eagles, PA-C  montelukast (SINGULAIR) 10 MG tablet Take 1 tablet (10 mg total) by mouth at bedtime. 07/26/19   Kennith Gain, MD  nitrofurantoin, macrocrystal-monohydrate, (MACROBID) 100 MG capsule Take 1 capsule (100 mg total) by mouth 2 (two) times daily. 12/12/22   Sherrell Puller, PA-C  nystatin cream (MYCOSTATIN) Apply to affected area 2 times daily 12/12/22   Sherrell Puller, PA-C  rizatriptan (MAXALT) 10 MG tablet Take 10 mg by mouth daily as needed for migraine.  01/02/20   [provider]  triamcinolone cream (KENALOG) 0.1 % Apply 1 application topically 4 (four) times daily. As needed for itching Patient taking differently: Apply 1 application  topically 3 (three) times daily as needed (for rash). 07/26/19   Renato Shin, MD    Family History Family History  Problem Relation Age of Onset   Cancer Mother        lung   Hypertension Maternal Grandmother    Diabetes Maternal Grandmother    Thyroid disease Neg Hx  Social History Social History   Tobacco Use   Smoking status: Never   Smokeless tobacco: Never  Vaping Use   Vaping Use: Never used  Substance Use Topics   Alcohol use: No   Drug use: No     Allergies   Frovatriptan and Sulfa antibiotics   Review of Systems Review of Systems  HENT:  Positive for ear pain.   Genitourinary:  Positive for frequency.  Per HPI   Physical Exam Triage Vital Signs ED Triage Vitals  Enc Vitals Group     BP 12/09/22 1026 (!) 125/101     Pulse Rate 12/09/22 1026 72     Resp 12/09/22 1026 16     Temp 12/09/22 1026 98.2 F (36.8 C)     Temp Source 12/09/22 1026 Oral     SpO2 12/09/22 1026 97 %     Weight 12/09/22 1025 158 lb (71.7 kg)     Height 12/09/22 1025 5' 4"$  (1.626 m)     Head Circumference --      Peak Flow --      Pain Score 12/09/22 1021 8     Pain Loc --      Pain Edu? --      Excl. in [place]? --    No data found.  Updated Vital Signs BP (!) 125/101 (BP Location: Left Arm)   Pulse 72   Temp 98.2 F (36.8 C) (Oral)   Resp 16   Ht 5' 4"$  (1.626 m)   Wt 158 lb (71.7 kg)   SpO2 97%   BMI 27.12 kg/m    Visual Acuity Right Eye Distance:   Left Eye Distance:   Bilateral Distance:    Right Eye Near:   Left Eye Near:    Bilateral Near:     Physical Exam Vitals and nursing note reviewed.  Constitutional:      Appearance: He is not ill-appearing or toxic-appearing.  HENT:     Head: Normocephalic and atraumatic.     Right Ear: Hearing, tympanic membrane, ear canal and external ear normal. There is impacted cerumen.     Left Ear: Hearing, tympanic membrane, ear canal and external ear normal.     Nose: Congestion present.     Mouth/Throat:     Lips: Pink.     Mouth: Mucous membranes are moist.     Pharynx: Oropharynx is clear. Uvula midline. No pharyngeal swelling, oropharyngeal exudate, posterior oropharyngeal erythema or uvula swelling.     Tonsils: No tonsillar exudate or tonsillar abscesses.  Eyes:     General: Lids are normal. Vision grossly intact. Gaze aligned appropriately.     Extraocular Movements: Extraocular movements intact.     Conjunctiva/sclera: Conjunctivae normal.  Cardiovascular:     Rate and Rhythm: Normal rate and regular rhythm.     Heart sounds: Normal heart sounds, S1 normal and S2 normal.  Pulmonary:     Effort: Pulmonary effort is normal. No respiratory distress.     Breath sounds: Normal breath sounds and air entry. No wheezing, rhonchi or rales.  Musculoskeletal:     Cervical back: Neck supple.  Skin:    General: Skin is warm and dry.     Capillary Refill: Capillary refill takes less than 2 seconds.     Findings: No rash.  Neurological:     General: No focal deficit present.     Mental Status: He is alert and oriented to person, place, and time. Mental status is at baseline.  Cranial Nerves: No dysarthria or facial asymmetry.  Psychiatric:        Mood and Affect: Mood normal.        Speech: Speech normal.        Behavior: Behavior normal.        Thought Content: Thought content normal.        Judgment: Judgment normal.      UC  Treatments / Results  Labs (all labs ordered are listed, but only abnormal results are displayed) Labs Reviewed  POCT URINALYSIS DIPSTICK, ED / UC - Abnormal; Notable for the following components:      Result Value   Glucose, UA 100 (*)    Bilirubin Urine SMALL (*)    Ketones, ur 15 (*)    Protein, ur 30 (*)    Urobilinogen, UA 2.0 (*)    Nitrite POSITIVE (*)    All other components within normal limits  CERVICOVAGINAL ANCILLARY ONLY - Abnormal; Notable for the following components:   Candida Vaginitis Positive (*)    All other components within normal limits  URINE CULTURE  SARS CORONAVIRUS 2 (TAT 6-24 HRS)    EKG   Radiology No results found.  Procedures Procedures (including critical care time)  Medications Ordered in UC Medications - No data to display  Initial Impression / Assessment and Plan / UC Course  I have reviewed the triage vital signs and the nursing notes.  Pertinent labs & imaging results that were available during my care of the patient were reviewed by me and considered in my medical decision making (see chart for details).   1. Acute cystitis with hematuria Recollected urine at today's visit.  Urinalysis indicates nitrate positive acute uncomplicated cystitis.  Will reculture this urine sample and call patient if results of culture change treatment plan.  I would like to switch patient's antibiotic from Keflex to nitrofurantoin.  He is unable to use Bactrim due to sulfa allergy.  Advised to take antibiotic with food to avoid stomach upset.  Cervicovaginal ancillary testing is pending as well due to ongoing dysuria.  Dysuria may be related to vaginitis as well, however he is not experiencing any symptoms.  Will call with results of vaginal testing and treat based on results as necessary.  He is agreeable with this plan.  He is to push water and drink plenty of fluids to stay well-hydrated.  He is currently nontoxic in appearance with hemodynamically stable  vital signs.  2. Symptoms and physical exam consistent with a viral upper respiratory tract infection that will likely resolve with rest, fluids, and prescriptions for symptomatic relief. Deferred imaging based on stable cardiopulmonary exam and hemodynamically stable vital signs.  COVID-19 testing is pending.  We will call patient if this is positive.  Quarantine guidelines discussed. Currently on day 3 of symptoms and does qualify for antiviral therapy.   Albuterol inhaler refill sent to pharmacy for symptomatic relief to be taken as prescribed.  Patient states he does not currently have an albuterol inhaler at home and would like refill sent to the pharmacy in case he develops shortness of breath due to history of asthma.  May continue taking over the counter medications as directed for further symptomatic relief.  Nonpharmacologic interventions for symptom relief provided and after visit summary below. Advised to push fluids to stay well hydrated while recovering from viral illness.   3. Cerumen impaction of right ear Cerumen impaction of right ear found on exam.  Nursing staff attempted ear lavage but  were unsuccessful as patient did not tolerate this very well.  Advised to purchase debrox ear drops and use as needed to clean out ear canal. Advised against using Q-tips as this will further push wax into the ear canal.   Discussed physical exam and available lab work findings in clinic with patient.  Counseled patient regarding appropriate use of medications and potential side effects for all medications recommended or prescribed today. Discussed red flag signs and symptoms of worsening condition,when to call the PCP office, return to urgent care, and when to seek higher level of care in the emergency department. Patient verbalizes understanding and agreement with plan. All questions answered. Patient discharged in stable condition.    Final Clinical Impressions(s) / UC Diagnoses   Final  diagnoses:  Acute cystitis with hematuria  Impacted cerumen of right ear  Viral URI     Discharge Instructions      Stop taking Keflex antibiotic and start taking Macrobid twice daily for the next 5 days.  I have sent your urine for culture again.  I will call you if this changes the treatment plan.  Continue to drink plenty of water to flush out your kidneys and stay well-hydrated.  I tested you for COVID today.  We will call you in the next 24 hours if this is positive.  If your test is negative, you will not hear from [place]  You may take over-the-counter medications to help with head cold symptoms. You may use albuterol inhaler sent to pharmacy as needed for cough, shortness of breath, and wheeze if you develop these types of symptoms while ill.  Use Debrox over-the-counter eardrops to the right ear to remove earwax.  Stop using Q-tips to the ears!   If you develop any new or worsening symptoms or do not improve in the next 2 to 3 days, please return.  If your symptoms are severe, please go to the emergency room.  Follow-up with your primary care provider for further evaluation and management of your symptoms as well as ongoing wellness visits.  I hope you feel better!    ED Prescriptions     Medication Sig Dispense Auth. Provider   nitrofurantoin, macrocrystal-monohydrate, (MACROBID) 100 MG capsule Take 1 capsule (100 mg total) by mouth 2 (two) times daily. 10 capsule Talbot Grumbling, FNP   albuterol (VENTOLIN HFA) 108 (90 Base) MCG/ACT inhaler Inhale 1-2 puffs into the lungs every 6 (six) hours as needed for wheezing or shortness of breath. 8 g Talbot Grumbling, FNP      PDMP not reviewed this encounter.   Talbot Grumbling, [place] 12/13/22 2052

## 2023-01-12 ENCOUNTER — Ambulatory Visit (HOSPITAL_COMMUNITY)
Admission: RE | Admit: 2023-01-12 | Discharge: 2023-01-12 | Disposition: A | Discharge status: Home / Self Care | Payer: Medicare Other | Source: Ambulatory Visit | Attending: Family Medicine | Admitting: Family Medicine

## 2023-01-12 ENCOUNTER — Encounter (HOSPITAL_COMMUNITY): Payer: Self-pay

## 2023-01-12 ENCOUNTER — Other Ambulatory Visit: Payer: Self-pay

## 2023-01-12 VITALS — BP 126/82 | HR 67 | Temp 98.3°F | Resp 14

## 2023-01-12 DIAGNOSIS — X12XXXA Contact with other hot fluids, initial encounter: Secondary | ICD-10-CM | POA: Diagnosis not present

## 2023-01-12 DIAGNOSIS — T2027XA Burn of second degree of neck, initial encounter: Secondary | ICD-10-CM

## 2023-01-12 DIAGNOSIS — X131XXA Other contact with steam and other hot vapors, initial encounter: Secondary | ICD-10-CM | POA: Diagnosis not present
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
## 2023-01-19 ENCOUNTER — Telehealth: Payer: Self-pay | Admitting: Internal Medicine

## 2023-01-19 NOTE — Telephone Encounter (Signed)
Patient came in to office and is requesting letter to be written for surgery.  Patient states that they have an appointment on Monday, 01/25/2023 in at the [facility].  Patient states that the contact person is nurse Raelyn Mora and the phone number is [phone].  Fax number is [phone].  Email address is:  [email or web address]

## 2023-01-20 NOTE — Telephone Encounter (Signed)
VM and fax has been left and sent to Raelyn Mora to find out exactly what is needed.

## 2023-01-21 ENCOUNTER — Encounter: Payer: Self-pay | Admitting: Internal Medicine

## 2023-01-21 NOTE — Telephone Encounter (Signed)
Spoke with Anthony Rowe and the transgender clinic and she will send message to nurse for them to give a call back regarding what information they are needing for [place].

## 2023-01-21 NOTE — Telephone Encounter (Signed)
They are needing a letter of support stating how long the patient has been on hormone therapy and information on his gender dysphoria.  Patient is trying to get approved for surgery.

## 2023-01-21 NOTE — Telephone Encounter (Signed)
Patient advised that letter has been completed and fax back

## 2023-04-29 ENCOUNTER — Emergency Department (HOSPITAL_COMMUNITY): Payer: Medicare Other

## 2023-04-29 ENCOUNTER — Other Ambulatory Visit: Payer: Self-pay

## 2023-04-29 ENCOUNTER — Emergency Department (HOSPITAL_COMMUNITY)
Admission: EM | Admit: 2023-04-29 | Discharge: 2023-04-30 | Disposition: A | Discharge status: Home / Self Care | Payer: Medicare Other | Attending: Emergency Medicine | Admitting: Emergency Medicine

## 2023-04-29 DIAGNOSIS — Z79899 Other long term (current) drug therapy: Secondary | ICD-10-CM | POA: Insufficient documentation

## 2023-04-29 DIAGNOSIS — R112 Nausea with vomiting, unspecified: Secondary | ICD-10-CM | POA: Insufficient documentation

## 2023-04-29 DIAGNOSIS — T383X5A Adverse effect of insulin and oral hypoglycemic [antidiabetic] drugs, initial encounter: Secondary | ICD-10-CM | POA: Insufficient documentation

## 2023-04-29 DIAGNOSIS — Z7951 Long term (current) use of inhaled steroids: Secondary | ICD-10-CM | POA: Diagnosis not present

## 2023-04-29 DIAGNOSIS — Z8616 Personal history of COVID-19: Secondary | ICD-10-CM | POA: Diagnosis not present

## 2023-04-29 DIAGNOSIS — I10 Essential (primary) hypertension: Secondary | ICD-10-CM | POA: Insufficient documentation

## 2023-04-29 DIAGNOSIS — R197 Diarrhea, unspecified: Secondary | ICD-10-CM | POA: Insufficient documentation

## 2023-04-29 DIAGNOSIS — J45909 Unspecified asthma, uncomplicated: Secondary | ICD-10-CM | POA: Insufficient documentation

## 2023-04-29 DIAGNOSIS — T887XXA Unspecified adverse effect of drug or medicament, initial encounter: Secondary | ICD-10-CM | POA: Diagnosis not present

## 2023-04-29 DIAGNOSIS — T50905A Adverse effect of unspecified drugs, medicaments and biological substances, initial encounter: Secondary | ICD-10-CM

## 2023-04-29 LAB — URINALYSIS, ROUTINE W REFLEX MICROSCOPIC
Bacteria, UA: NONE SEEN
Bilirubin Urine: NEGATIVE
Glucose, UA: NEGATIVE mg/dL
Ketones, ur: 5 mg/dL — AB
Nitrite: NEGATIVE
Protein, ur: 30 mg/dL — AB
RBC / HPF: >50 RBC/hpf (ref 0–5)
Specific Gravity, Urine: 1.029 (ref 1.005–1.030)
pH: 5 (ref 5.0–8.0)

## 2023-04-29 LAB — BASIC METABOLIC PANEL
Anion gap: 12 (ref 5–15)
BUN: 8 mg/dL (ref 6–20)
CO2: 26 mmol/L (ref 22–32)
Calcium: 9.4 mg/dL (ref 8.9–10.3)
Chloride: 102 mmol/L (ref 98–111)
Creatinine, Ser: 1.46 mg/dL — ABNORMAL HIGH (ref 0.61–1.24)
GFR, Estimated: >60 mL/min (ref >60)
Glucose, Bld: 95 mg/dL (ref 70–99)
Potassium: 3.8 mmol/L (ref 3.5–5.1)
Sodium: 140 mmol/L (ref 135–145)

## 2023-04-29 LAB — CBC
HCT: 46.2 % (ref 39.0–52.0)
Hemoglobin: 15.8 g/dL (ref 13.0–17.0)
MCH: 30.2 pg (ref 26.0–34.0)
MCHC: 34.2 g/dL (ref 30.0–36.0)
MCV: 88.3 fL (ref 80.0–100.0)
Platelets: 310 10*3/uL (ref 150–400)
RBC: 5.23 MIL/uL (ref 4.22–5.81)
RDW: 12.9 % (ref 11.5–15.5)
WBC: 4.9 10*3/uL (ref 4.0–10.5)
nRBC: 0 % (ref 0.0–0.2)

## 2023-04-29 LAB — TROPONIN I (HIGH SENSITIVITY): Troponin I (High Sensitivity): 4 ng/L (ref <18)

## 2023-04-29 LAB — LIPASE, BLOOD: Lipase: 36 U/L (ref 11–51)

## 2023-04-29 MED ORDER — DROPERIDOL 2.5 MG/ML IJ SOLN
1.2500 mg | Freq: Once | INTRAMUSCULAR | Status: AC
  Administered 2023-04-29: 1.25 mg via INTRAVENOUS
  Filled 2023-04-29: qty 2

## 2023-04-29 MED ORDER — SODIUM CHLORIDE 0.9 % IV BOLUS
1000.0000 mL | Freq: Once | INTRAVENOUS | Status: AC
  Administered 2023-04-30: 1000 mL via INTRAVENOUS

## 2023-04-29 NOTE — ED Triage Notes (Signed)
Pt arrives to ED c/o emesis and diarrhea x 1 month. Pt also endorses hypertension without medication or dx. Unable to keep anything PO without vomiting. Pt also endorses intermittent CP previously but denies it today

## 2023-04-30 DIAGNOSIS — R112 Nausea with vomiting, unspecified: Secondary | ICD-10-CM | POA: Diagnosis not present

## 2023-04-30 NOTE — ED Provider Notes (Signed)
[name] EMERGENCY DEPARTMENT AT [facility] Provider Note   CSN: [id number] Arrival date & time: 04/29/23  2146     History  Chief Complaint  Patient presents with   Emesis   Diarrhea   Hypertension    Anthony Rowe is a 42 y.o. adult.  The history is provided by the patient.  Emesis Severity:  Moderate Duration: months. Quality:  Stomach contents Progression:  Unchanged Recent urination:  Normal Context: not post-tussive   Relieved by:  Nothing Worsened by:  Nothing Associated symptoms: diarrhea   Associated symptoms: no fever   Risk factors: no prior abdominal surgery   Patient was on Ozempic for weight loss and had vomiting and diarrhea on that medication and was taken off and then changed to [facility] but patient reports PMD was unsure if symptoms were due to GLP-1 or because patient lost his wife.  Patient also has elevated BP.      Past Medical History:  Diagnosis Date   Asthma    Bronchitis    COVID-19    Endometriosis    Hypertension    Migraine    Thyroid disease    hyperthyroidism per pt report     Home Medications Prior to Admission medications   Medication Sig Start Date End Date Taking? Authorizing Provider  albuterol (VENTOLIN HFA) 108 (90 Base) MCG/ACT inhaler Inhale 1-2 puffs into the lungs every 6 (six) hours as needed for wheezing or shortness of breath. 12/09/22   Carlisle Beers, FNP  buPROPion (WELLBUTRIN XL) 150 MG 24 hr tablet Take 150 mg by mouth daily. 12/05/19   [provider]  busPIRone (BUSPAR) 10 MG tablet Take 10 mg by mouth 2 (two) times daily. 11/14/19   [provider]  fluticasone (FLONASE) 50 MCG/ACT nasal spray Place 1 spray into both nostrils daily. Patient taking differently: Place 1 spray into both nostrils daily as needed for allergies. 07/14/19   Georgetta Haber, NP  fluticasone (FLOVENT HFA) 220 MCG/ACT inhaler Inhale 2 puffs into the lungs twice daily 02/09/20   Marcelyn Bruins,  MD  gabapentin (NEURONTIN) 100 MG capsule Take 200 mg by mouth at bedtime.  01/10/20   [provider]  hydrOXYzine (ATARAX/VISTARIL) 25 MG tablet Take 1-2 tablets (25-50 mg total) by mouth at bedtime as needed. Patient taking differently: Take 25-50 mg by mouth at bedtime as needed for anxiety or vomiting. 07/26/19   Marcelyn Bruins, MD  levocetirizine (XYZAL) 5 MG tablet Take 1 tablet (5 mg total) by mouth 2 (two) times daily. Patient taking differently: Take 5 mg by mouth daily as needed for allergies. 03/30/19   Marcelyn Bruins, MD  levothyroxine (SYNTHROID) 137 MCG tablet Take 1 tablet (137 mcg total) by mouth daily before breakfast. 12/01/22   Shamleffer, Konrad Dolores, MD  Lidocaine (HM LIDOCAINE PATCH) 4 % PTCH Apply 1 patch topically every 12 (twelve) hours as needed. 12/17/21   Haskel Schroeder, PA-C  loperamide (IMODIUM) 2 MG capsule Take 1 capsule (2 mg total) by mouth daily as needed for diarrhea or loose stools. 07/18/19   Wallis Bamberg, PA-C  metoprolol succinate (TOPROL-XL) 25 MG 24 hr tablet Take 1 tablet (25 mg total) by mouth daily. Take with or immediately following a meal. 05/19/20   Fayrene Helper, PA-C  montelukast (SINGULAIR) 10 MG tablet Take 1 tablet (10 mg total) by mouth at bedtime. 07/26/19   Marcelyn Bruins, MD  nitrofurantoin, macrocrystal-monohydrate, (MACROBID) 100 MG capsule Take 1 capsule (100  mg total) by mouth 2 (two) times daily. 12/12/22   Achille Rich, PA-C  nystatin cream (MYCOSTATIN) Apply to affected area 2 times daily 12/12/22   Achille Rich, PA-C  ondansetron (ZOFRAN-ODT) 8 MG disintegrating tablet Take 1 tablet (8 mg total) by mouth every 8 (eight) hours as needed for nausea or vomiting. 07/18/19   Wallis Bamberg, PA-C  rizatriptan (MAXALT) 10 MG tablet Take 10 mg by mouth daily as needed for migraine.  01/02/20   [provider]  Syringe/Needle, Disp, 22G X 1-1/2" 1 ML MISC 1 Device by Does not apply route every 14  (fourteen) days. 07/10/22   Shamleffer, Konrad Dolores, MD  Testosterone Cypionate 200 MG/ML SOLN Inject 50 mg as directed every 14 (fourteen) days. 12/01/22   Shamleffer, Konrad Dolores, MD  traZODone (DESYREL) 100 MG tablet Take 100 mg by mouth at bedtime as needed for sleep.     [provider]  triamcinolone cream (KENALOG) 0.1 % Apply 1 application topically 4 (four) times daily. As needed for itching Patient taking differently: Apply 1 application  topically 3 (three) times daily as needed (for rash). 07/26/19   Romero Belling, MD      Allergies    Frovatriptan and Sulfa antibiotics    Review of Systems   Review of Systems  Constitutional:  Negative for fever.  HENT:  Negative for facial swelling.   Respiratory:  Negative for shortness of breath, wheezing and stridor.   Cardiovascular:  Negative for chest pain, palpitations and leg swelling.  Gastrointestinal:  Positive for diarrhea, nausea and vomiting.  All other systems reviewed and are negative.   Physical Exam Updated Vital Signs BP (!) 159/106 (BP Location: Right Arm)   Pulse (!) 58   Temp 98.1 F (36.7 C) (Oral)   Resp 18   Ht 5\' 4"  (1.626 m)   SpO2 100%   BMI 27.12 kg/m  Physical Exam Vitals and nursing note reviewed.  Constitutional:      General: He is not in acute distress.    Appearance: Normal appearance. He is well-developed. He is not diaphoretic.  HENT:     Head: Normocephalic and atraumatic.     Nose: Nose normal.  Eyes:     Conjunctiva/sclera: Conjunctivae normal.     Pupils: Pupils are equal, round, and reactive to light.  Cardiovascular:     Rate and Rhythm: Normal rate and regular rhythm.     Pulses: Normal pulses.     Heart sounds: Normal heart sounds.  Pulmonary:     Effort: Pulmonary effort is normal.     Breath sounds: Normal breath sounds. No wheezing or rales.  Abdominal:     General: Bowel sounds are normal.     Palpations: Abdomen is soft.     Tenderness: There is no  abdominal tenderness. There is no guarding or rebound.  Musculoskeletal:        General: Normal range of motion.     Cervical back: Normal range of motion and neck supple.  Skin:    General: Skin is warm and dry.     Capillary Refill: Capillary refill takes less than 2 seconds.  Neurological:     General: No focal deficit present.     Mental Status: He is alert and oriented to person, place, and time.     Deep Tendon Reflexes: Reflexes normal.  Psychiatric:        Mood and Affect: Mood normal.        Behavior: Behavior normal.  ED Results / Procedures / Treatments   Labs (all labs ordered are listed, but only abnormal results are displayed) Results for orders placed or performed during the hospital encounter of 04/29/23  Basic metabolic panel  Result Value Ref Range   Sodium 140 135 - 145 mmol/L   Potassium 3.8 3.5 - 5.1 mmol/L   Chloride 102 98 - 111 mmol/L   CO2 26 22 - 32 mmol/L   Glucose, Bld 95 70 - 99 mg/dL   BUN 8 6 - 20 mg/dL   Creatinine, Ser 7.82 (H) 0.61 - 1.24 mg/dL   Calcium 9.4 8.9 - 95.6 mg/dL   GFR, Estimated >21 >30 mL/min   Anion gap 12 5 - 15  CBC  Result Value Ref Range   WBC 4.9 4.0 - 10.5 K/uL   RBC 5.23 4.22 - 5.81 MIL/uL   Hemoglobin 15.8 13.0 - 17.0 g/dL   HCT 86.5 78.4 - 69.6 %   MCV 88.3 80.0 - 100.0 fL   MCH 30.2 26.0 - 34.0 pg   MCHC 34.2 30.0 - 36.0 g/dL   RDW 29.5 28.4 - 13.2 %   Platelets 310 150 - 400 K/uL   nRBC 0.0 0.0 - 0.2 %  Lipase, blood  Result Value Ref Range   Lipase 36 11 - 51 U/L  Urinalysis, Routine w reflex microscopic -Urine, Clean Catch  Result Value Ref Range   Color, Urine YELLOW YELLOW   APPearance HAZY (A) CLEAR   Specific Gravity, Urine 1.029 1.005 - 1.030   pH 5.0 5.0 - 8.0   Glucose, UA NEGATIVE NEGATIVE mg/dL   Hgb urine dipstick LARGE (A) NEGATIVE   Bilirubin Urine NEGATIVE NEGATIVE   Ketones, ur 5 (A) NEGATIVE mg/dL   Protein, ur 30 (A) NEGATIVE mg/dL   Nitrite NEGATIVE NEGATIVE   Leukocytes,Ua  TRACE (A) NEGATIVE   RBC / HPF >50 0 - 5 RBC/hpf   WBC, UA 6-10 0 - 5 WBC/hpf   Bacteria, UA NONE SEEN NONE SEEN   Squamous Epithelial / HPF 0-5 0 - 5 /HPF   Mucus PRESENT    Ca Oxalate Crys, UA PRESENT   Troponin I (High Sensitivity)  Result Value Ref Range   Troponin I (High Sensitivity) 4 <18 ng/L   DG Chest 2 View  Result Date: 04/29/2023 CLINICAL DATA:  Chest pain and vomiting. EXAM: CHEST - 2 VIEW COMPARISON:  December 17, 2021 FINDINGS: The heart size and mediastinal contours are within normal limits. Both lungs are clear. The visualized skeletal structures are unremarkable. IMPRESSION: No active cardiopulmonary disease. Electronically Signed   By: Aram Candela M.D.   On: 04/29/2023 23:06    EKG EKG Interpretation Date/Time:  Thursday April 29 2023 22:55:46 EDT Ventricular Rate:  58 PR Interval:  148 QRS Duration:  74 QT Interval:  434 QTC Calculation: 426 R Axis:   81  Text Interpretation: Sinus bradycardia Confirmed by Duru Reiger ([id number]) on 04/29/2023 11:11:24 PM  Radiology DG Chest 2 View  Result Date: 04/29/2023 CLINICAL DATA:  Chest pain and vomiting. EXAM: CHEST - 2 VIEW COMPARISON:  December 17, 2021 FINDINGS: The heart size and mediastinal contours are within normal limits. Both lungs are clear. The visualized skeletal structures are unremarkable. IMPRESSION: No active cardiopulmonary disease. Electronically Signed   By: Aram Candela M.D.   On: 04/29/2023 23:06    Procedures Procedures    Medications Ordered in ED Medications  droperidol (INAPSINE) 2.5 MG/ML injection 1.25 mg (1.25 mg Intravenous Given 04/29/23 2359)  sodium chloride 0.9 % bolus 1,000 mL (1,000 mLs Intravenous New Bag/Given 04/30/23 0000)    ED Course/ Medical Decision Making/ A&P                             Medical Decision Making Patient with nausea vomiting and diarrhea PMD unable to determine whether it was the death of the patient's wife or medication side effect   Amount  and/or Complexity of Data Reviewed External Data Reviewed: notes.    Details: Previous notes reviewed  Labs: ordered.    Details: Blood in urine.  Normal sodium 140, potassium normal 3.8, creatinine elevated 1.46. normal white count 4.9, normal hemoglobin 15.8, normal platelet count. Normal troponin 4  Radiology: ordered and independent interpretation performed.    Details: Normal chest Xray   Risk Prescription drug management. Risk Details: Patient is well appearing.  Nausea vomiting and diarrhea is a known side effect of GLP1 and was on Ozempic and then switched to the stronger member in the class and therefore symptoms have persisted.  I have advised stopping this medication and all medications in this class.  Discuss your blood pressure with your PMD.  Stable for discharge.  Strict return.      Final Clinical Impression(s) / ED Diagnoses Final diagnoses:  Nausea vomiting and diarrhea  Adverse effect of drug, initial encounter       Return for intractable cough, coughing up blood, fevers > 100.4 unrelieved by medication, shortness of breath, intractable vomiting, chest pain, shortness of breath, weakness, numbness, changes in speech, facial asymmetry, abdominal pain, passing out, Inability to tolerate liquids or food, cough, altered mental status or any concerns. No signs of systemic illness or infection. The patient is nontoxic-appearing on exam and vital signs are within normal limits.  I have reviewed the triage vital signs and the nursing notes. Pertinent labs & imaging results that were available during my care of the patient were reviewed by me and considered in my medical decision making (see chart for details). After history, exam, and medical workup I feel the patient has been appropriately medically screened and is safe for discharge home. Pertinent diagnoses were discussed with the patient. Patient was given return precautions.      Cheryln Balcom, MD 04/30/23 0040

## 2023-05-03 ENCOUNTER — Other Ambulatory Visit: Payer: Self-pay | Admitting: Internal Medicine

## 2023-05-04 LAB — TSH: TSH: 60.59 mIU/L — ABNORMAL HIGH (ref 0.40–4.50)

## 2023-05-04 LAB — LIPID PANEL
Cholesterol: 208 mg/dL — ABNORMAL HIGH (ref <200)
HDL: 81 mg/dL (ref >=40)
LDL Cholesterol (Calc): 106 mg/dL (calc) — ABNORMAL HIGH
Non-HDL Cholesterol (Calc): 127 mg/dL (calc) (ref <130)
Total CHOL/HDL Ratio: 2.6 (calc) (ref <5.0)
Triglycerides: 109 mg/dL (ref <150)

## 2023-05-04 LAB — COMPLETE METABOLIC PANEL WITH GFR
AG Ratio: 2 (calc) (ref 1.0–2.5)
ALT: 17 U/L (ref 9–46)
AST: 16 U/L (ref 10–40)
Albumin: 4.6 g/dL (ref 3.6–5.1)
Alkaline phosphatase (APISO): 53 U/L (ref 36–130)
BUN/Creatinine Ratio: 6 (calc) (ref 6–22)
BUN: 9 mg/dL (ref 7–25)
CO2: 26 mmol/L (ref 20–32)
Calcium: 10.1 mg/dL (ref 8.6–10.3)
Chloride: 102 mmol/L (ref 98–110)
Creat: 1.39 mg/dL — ABNORMAL HIGH (ref 0.60–1.29)
Globulin: 2.3 g/dL (calc) (ref 1.9–3.7)
Glucose, Bld: 79 mg/dL (ref 65–99)
Potassium: 4.3 mmol/L (ref 3.5–5.3)
Sodium: 139 mmol/L (ref 135–146)
Total Bilirubin: 0.6 mg/dL (ref 0.2–1.2)
Total Protein: 6.9 g/dL (ref 6.1–8.1)
eGFR: 65 mL/min/{1.73_m2} (ref >=60)

## 2023-05-04 LAB — CBC
HCT: 49.6 % (ref 38.5–50.0)
Hemoglobin: 16.5 g/dL (ref 13.2–17.1)
MCH: 30.4 pg (ref 27.0–33.0)
MCHC: 33.3 g/dL (ref 32.0–36.0)
MCV: 91.5 fL (ref 80.0–100.0)
MPV: 9.5 fL (ref 7.5–12.5)
Platelets: 293 10*3/uL (ref 140–400)
RBC: 5.42 10*6/uL (ref 4.20–5.80)
RDW: 13.2 % (ref 11.0–15.0)
WBC: 3.4 10*3/uL — ABNORMAL LOW (ref 3.8–10.8)

## 2023-05-04 LAB — VITAMIN D 25 HYDROXY (VIT D DEFICIENCY, FRACTURES): Vit D, 25-Hydroxy: 31 ng/mL (ref 30–100)

## 2023-05-13 ENCOUNTER — Other Ambulatory Visit: Payer: Self-pay

## 2023-05-13 ENCOUNTER — Encounter (HOSPITAL_COMMUNITY): Payer: Self-pay | Admitting: Emergency Medicine

## 2023-05-13 ENCOUNTER — Emergency Department (HOSPITAL_COMMUNITY)
Admission: EM | Admit: 2023-05-13 | Discharge: 2023-05-13 | Disposition: A | Discharge status: Home / Self Care | Payer: Medicare Other | Attending: Emergency Medicine | Admitting: Emergency Medicine

## 2023-05-13 DIAGNOSIS — G43809 Other migraine, not intractable, without status migrainosus: Secondary | ICD-10-CM | POA: Diagnosis not present

## 2023-05-13 DIAGNOSIS — Z79899 Other long term (current) drug therapy: Secondary | ICD-10-CM | POA: Diagnosis not present

## 2023-05-13 DIAGNOSIS — R519 Headache, unspecified: Secondary | ICD-10-CM | POA: Diagnosis present

## 2023-05-13 DIAGNOSIS — R030 Elevated blood-pressure reading, without diagnosis of hypertension: Secondary | ICD-10-CM

## 2023-05-13 DIAGNOSIS — I10 Essential (primary) hypertension: Secondary | ICD-10-CM | POA: Insufficient documentation

## 2023-05-13 LAB — CBC WITH DIFFERENTIAL/PLATELET
Abs Immature Granulocytes: 0.01 10*3/uL (ref 0.00–0.07)
Basophils Absolute: 0.1 10*3/uL (ref 0.0–0.1)
Basophils Relative: 1 %
Eosinophils Absolute: 0 10*3/uL (ref 0.0–0.5)
Eosinophils Relative: 1 %
HCT: 48.2 % (ref 39.0–52.0)
Hemoglobin: 16.3 g/dL (ref 13.0–17.0)
Immature Granulocytes: 0 %
Lymphocytes Relative: 29 %
Lymphs Abs: 1.5 10*3/uL (ref 0.7–4.0)
MCH: 30.7 pg (ref 26.0–34.0)
MCHC: 33.8 g/dL (ref 30.0–36.0)
MCV: 90.8 fL (ref 80.0–100.0)
Monocytes Absolute: 0.4 10*3/uL (ref 0.1–1.0)
Monocytes Relative: 9 %
Neutro Abs: 3.1 10*3/uL (ref 1.7–7.7)
Neutrophils Relative %: 60 %
Platelets: 312 10*3/uL (ref 150–400)
RBC: 5.31 MIL/uL (ref 4.22–5.81)
RDW: 13.6 % (ref 11.5–15.5)
WBC: 5.2 10*3/uL (ref 4.0–10.5)
nRBC: 0 % (ref 0.0–0.2)

## 2023-05-13 LAB — BASIC METABOLIC PANEL
Anion gap: 8 (ref 5–15)
BUN: 11 mg/dL (ref 6–20)
CO2: 28 mmol/L (ref 22–32)
Calcium: 9.6 mg/dL (ref 8.9–10.3)
Chloride: 105 mmol/L (ref 98–111)
Creatinine, Ser: 1.35 mg/dL — ABNORMAL HIGH (ref 0.61–1.24)
GFR, Estimated: >60 mL/min (ref >60)
Glucose, Bld: 118 mg/dL — ABNORMAL HIGH (ref 70–99)
Potassium: 4.4 mmol/L (ref 3.5–5.1)
Sodium: 141 mmol/L (ref 135–145)

## 2023-05-13 MED ORDER — METOCLOPRAMIDE HCL 5 MG/ML IJ SOLN
10.0000 mg | Freq: Once | INTRAMUSCULAR | Status: AC
  Administered 2023-05-13: 10 mg via INTRAVENOUS
  Filled 2023-05-13: qty 2

## 2023-05-13 MED ORDER — KETOROLAC TROMETHAMINE 15 MG/ML IJ SOLN
15.0000 mg | Freq: Once | INTRAMUSCULAR | Status: AC
  Administered 2023-05-13: 15 mg via INTRAVENOUS
  Filled 2023-05-13: qty 1

## 2023-05-13 MED ORDER — SODIUM CHLORIDE 0.9 % IV BOLUS
1000.0000 mL | Freq: Once | INTRAVENOUS | Status: AC
  Administered 2023-05-13: 1000 mL via INTRAVENOUS

## 2023-05-13 MED ORDER — DIPHENHYDRAMINE HCL 50 MG/ML IJ SOLN
25.0000 mg | Freq: Once | INTRAMUSCULAR | Status: AC
  Administered 2023-05-13: 25 mg via INTRAVENOUS
  Filled 2023-05-13: qty 1

## 2023-05-13 MED ORDER — DEXAMETHASONE SODIUM PHOSPHATE 10 MG/ML IJ SOLN
10.0000 mg | Freq: Once | INTRAMUSCULAR | Status: AC
  Administered 2023-05-13: 10 mg via INTRAVENOUS
  Filled 2023-05-13: qty 1

## 2023-05-13 NOTE — ED Notes (Signed)
Awaiting patient from lobby 

## 2023-05-13 NOTE — ED Triage Notes (Signed)
Patient arrives ambulatory by POV c/o migraine going for a while. Was at [employer] and didn't feel right so checked BP and it was high. Attempted to call doctor but did not answer so came here. Patient states takes oxy for back pain.

## 2023-05-13 NOTE — Discharge Instructions (Addendum)
Previously seen by Jennette Bill with [facility] Phone [phone] [address] [place], [place] [address]\  Thank you for letting us take care of you today. There were no emergent findings on your labs today. We gave you medications and fluids to treat your headache. The information above is for previous neurology specialist you were followed by. Call their office to try and schedule appointment to discuss your migraine headaches and recommended medication changes or need for referral elsewhere.   Follow up with your PCP within the next week for discussion of your ED visit today as well as recheck of your blood pressure.  I recommend checking your blood pressure about once daily around the same time and keeping a log of this until this follow-up appointment.  If you are unable to get into neurology, you may also discuss this at this follow-up appointment.  For any new symptom or worsening condition, return to the nearest ED for further evaluation.

## 2023-05-13 NOTE — ED Provider Notes (Signed)
[place] EMERGENCY DEPARTMENT AT [facility] Provider Note   CSN: [id number] Arrival date & time: 05/13/23  1714     History  Chief Complaint  Patient presents with   Hypertension    Anthony Rowe is a 42 y.o. adult with past medical history hypertension, migraine headaches, thyroid disease, male to male transition on testosterone therapy who presents to the ED complaining of a severe headache for the last 3 to 4 days.  Describes it as bilateral frontal.  History of migraine headaches previously on preventative medications at home.  Notes that used to be followed by neurology approximately 1 to 2 years ago but after trialing multiple medications had no improvement so was referred to specialist for discussion of CGRP inhibitors but was unable to keep this follow up. Associated nausea and bilateral blurry vision. No head injury. No associated fever, neck pain, vision loss, vomiting with this headache. Noted at pharmacy today blood pressure was elevated at 140s/90s so came to ED for further assessment.  Takes oxycodone for chronic back pain and has been taking this but no improvement in headaches.    Home Medications Prior to Admission medications   Medication Sig Start Date End Date Taking? Authorizing Provider  albuterol (VENTOLIN HFA) 108 (90 Base) MCG/ACT inhaler Inhale 1-2 puffs into the lungs every 6 (six) hours as needed for wheezing or shortness of breath. 12/09/22   Carlisle Beers, FNP  buPROPion (WELLBUTRIN XL) 150 MG 24 hr tablet Take 150 mg by mouth daily. 12/05/19   [provider]  busPIRone (BUSPAR) 10 MG tablet Take 10 mg by mouth 2 (two) times daily. 11/14/19   [provider]  fluticasone (FLONASE) 50 MCG/ACT nasal spray Place 1 spray into both nostrils daily. Patient taking differently: Place 1 spray into both nostrils daily as needed for allergies. 07/14/19   Georgetta Haber, NP  fluticasone (FLOVENT HFA) 220 MCG/ACT inhaler Inhale 2  puffs into the lungs twice daily 02/09/20   Marcelyn Bruins, MD  gabapentin (NEURONTIN) 100 MG capsule Take 200 mg by mouth at bedtime.  01/10/20   [provider]  hydrOXYzine (ATARAX/VISTARIL) 25 MG tablet Take 1-2 tablets (25-50 mg total) by mouth at bedtime as needed. Patient taking differently: Take 25-50 mg by mouth at bedtime as needed for anxiety or vomiting. 07/26/19   Marcelyn Bruins, MD  levocetirizine (XYZAL) 5 MG tablet Take 1 tablet (5 mg total) by mouth 2 (two) times daily. Patient taking differently: Take 5 mg by mouth daily as needed for allergies. 03/30/19   Marcelyn Bruins, MD  levothyroxine (SYNTHROID) 137 MCG tablet Take 1 tablet (137 mcg total) by mouth daily before breakfast. 12/01/22   Shamleffer, Konrad Dolores, MD  Lidocaine (HM LIDOCAINE PATCH) 4 % PTCH Apply 1 patch topically every 12 (twelve) hours as needed. 12/17/21   Haskel Schroeder, PA-C  loperamide (IMODIUM) 2 MG capsule Take 1 capsule (2 mg total) by mouth daily as needed for diarrhea or loose stools. 07/18/19   Wallis Bamberg, PA-C  metoprolol succinate (TOPROL-XL) 25 MG 24 hr tablet Take 1 tablet (25 mg total) by mouth daily. Take with or immediately following a meal. 05/19/20   Fayrene Helper, PA-C  montelukast (SINGULAIR) 10 MG tablet Take 1 tablet (10 mg total) by mouth at bedtime. 07/26/19   Marcelyn Bruins, MD  nitrofurantoin, macrocrystal-monohydrate, (MACROBID) 100 MG capsule Take 1 capsule (100 mg total) by mouth 2 (two) times daily. 12/12/22   Achille Rich, PA-C  nystatin cream (MYCOSTATIN) Apply to affected area 2 times daily 12/12/22   Achille Rich, PA-C  ondansetron (ZOFRAN-ODT) 8 MG disintegrating tablet Take 1 tablet (8 mg total) by mouth every 8 (eight) hours as needed for nausea or vomiting. 07/18/19   Wallis Bamberg, PA-C  rizatriptan (MAXALT) 10 MG tablet Take 10 mg by mouth daily as needed for migraine.  01/02/20   [provider]  Syringe/Needle, Disp,  22G X 1-1/2" 1 ML MISC 1 Device by Does not apply route every 14 (fourteen) days. 07/10/22   Shamleffer, Konrad Dolores, MD  Testosterone Cypionate 200 MG/ML SOLN Inject 50 mg as directed every 14 (fourteen) days. 12/01/22   Shamleffer, Konrad Dolores, MD  traZODone (DESYREL) 100 MG tablet Take 100 mg by mouth at bedtime as needed for sleep.     [provider]  triamcinolone cream (KENALOG) 0.1 % Apply 1 application topically 4 (four) times daily. As needed for itching Patient taking differently: Apply 1 application  topically 3 (three) times daily as needed (for rash). 07/26/19   Romero Belling, MD      Allergies    Frovatriptan and Sulfa antibiotics    Review of Systems   Review of Systems  All other systems reviewed and are negative.   Physical Exam Updated Vital Signs BP 126/89   Pulse 60   Temp 98.5 F (36.9 C)   Resp 20   Ht 5\' 4"  (1.626 m)   Wt 71.7 kg   SpO2 97%   BMI 27.12 kg/m  Physical Exam Vitals and nursing note reviewed.  Constitutional:      General: He is not in acute distress.    Appearance: Normal appearance.  HENT:     Head: Normocephalic and atraumatic.     Mouth/Throat:     Mouth: Mucous membranes are moist.  Eyes:     Conjunctiva/sclera: Conjunctivae normal.  Neck:     Comments: No meningismus Cardiovascular:     Rate and Rhythm: Normal rate and regular rhythm.     Heart sounds: No murmur heard. Pulmonary:     Effort: Pulmonary effort is normal.     Breath sounds: Normal breath sounds.  Abdominal:     General: Abdomen is flat.     Palpations: Abdomen is soft.     Tenderness: There is no abdominal tenderness.  Musculoskeletal:        General: Normal range of motion.     Cervical back: Normal range of motion and neck supple. No rigidity.     Right lower leg: No edema.     Left lower leg: No edema.  Skin:    General: Skin is warm and dry.     Capillary Refill: Capillary refill takes less than 2 seconds.  Neurological:     General:  No focal deficit present.     Mental Status: He is alert and oriented to person, place, and time.     GCS: GCS eye subscore is 4. GCS verbal subscore is 5. GCS motor subscore is 6.     Cranial Nerves: Cranial nerves 2-12 are intact. No cranial nerve deficit, dysarthria or facial asymmetry.     Sensory: Sensation is intact.     Motor: Motor function is intact.     Coordination: Coordination is intact.  Psychiatric:        Behavior: Behavior normal.     ED Results / Procedures / Treatments   Labs (all labs ordered are listed, but only abnormal results are displayed) Labs  Reviewed  BASIC METABOLIC PANEL - Abnormal; Notable for the following components:      Result Value   Glucose, Bld 118 (*)    Creatinine, Ser 1.35 (*)    All other components within normal limits  CBC WITH DIFFERENTIAL/PLATELET    EKG None  Radiology No results found.  Procedures Procedures    Medications Ordered in ED Medications  ketorolac (TORADOL) 15 MG/ML injection 15 mg (15 mg Intravenous Given 05/13/23 1841)  metoCLOPramide (REGLAN) injection 10 mg (10 mg Intravenous Given 05/13/23 1842)  dexamethasone (DECADRON) injection 10 mg (10 mg Intravenous Given 05/13/23 1844)  sodium chloride 0.9 % bolus 1,000 mL (0 mLs Intravenous Stopped 05/13/23 1949)  diphenhydrAMINE (BENADRYL) injection 25 mg (25 mg Intravenous Given 05/13/23 1843)    ED Course/ Medical Decision Making/ A&P                             Medical Decision Making Amount and/or Complexity of Data Reviewed Labs: ordered. Decision-making details documented in ED Course.  Risk Prescription drug management.   Medical Decision Making:   Anthony Rowe is a 42 y.o. adult who presented to the ED today with headache detailed above.    Patient's presentation is complicated by their history of polypharmacy, multiple comorbidities.  Complete initial physical exam performed, notably the patient was in no acute distress.  Neurologically intact.   No meningismus.  Nontoxic-appearing.    Reviewed and confirmed nursing documentation for past medical history, family history, social history.    Initial Assessment:   With the patient's presentation of headache, differential diagnosis includes but is not limited to emergent considerations for headache include subarachnoid hemorrhage, meningitis, temporal arteritis, glaucoma, cerebral ischemia, carotid/vertebral dissection, intracranial tumor, venous sinus thrombosis, carbon monoxide poisoning, acute or chronic subdural hemorrhage.  Other considerations include: migraine, cluster headache, hypertension, caffeine, alcohol, or drug withdrawal, pseudotumor cerebri, arteriovenous malformation, head injury, neurocysticercosis, post-lumbar puncture, preeclampsia in those assigned male at birth and in reproductive age, tension headache, viral vs acute bacterial sinusitis, cervical arthritis, refractive error causing strain, temporomandibular joint syndrome, depression, somatoform disorder (eg, somatization), trigeminal neuralgia, glossopharyngeal neuralgia.  This is most consistent with an acute complicated illness  Initial Plan:  Screening labs including CBC and Metabolic panel to evaluate for infectious or metabolic etiology of disease.  Headache treatment Symptomatic management Objective evaluation as below reviewed   Initial Study Results:   Laboratory  All laboratory results reviewed without evidence of clinically relevant pathology.   Exceptions include: Creatinine 1.35 similar to baseline    Final Assessment and Plan:   42 year old presents to the ED complaining of headache.  Also took blood pressure today and noticed that it was elevated with a systolic of 140s over diastolic of 90s.  History of chronic migraines, previously followed by neurology but has lost follow-up.  Takes oxycodone for chronic back pain at home but has had no relief with this.  Patient neurologically intact.  Labs at  baseline.  No focal deficits on neuroexam.  Treated with headache cocktail with improvement in headache.  Provided with information for previous neurologist for follow-up.  Also encouraged monitoring of blood pressure at home and close follow-up with primary care.  No evidence of hypertensive emergency.  Patient agreeable to do so.  Strict ED return precautions given, all questions answered, and stable for discharge.   Clinical Impression:  1. Other migraine without status migrainosus, not intractable   2. Elevated blood pressure  reading      Discharge           Final Clinical Impression(s) / ED Diagnoses Final diagnoses:  Other migraine without status migrainosus, not intractable  Elevated blood pressure reading    Rx / DC Orders ED Discharge Orders     None         Richardson Dopp 05/13/23 2007    Lonell Grandchild, MD 05/20/23 [phone]

## 2023-05-14 ENCOUNTER — Telehealth: Payer: Self-pay | Admitting: Internal Medicine

## 2023-05-14 DIAGNOSIS — E890 Postprocedural hypothyroidism: Secondary | ICD-10-CM

## 2023-05-14 MED ORDER — LEVOTHYROXINE SODIUM 137 MCG PO TABS: 274.0000 ug | ORAL_TABLET | Freq: Every day | ORAL | 2 refills | Status: DC

## 2023-05-14 NOTE — Telephone Encounter (Signed)
Patient has been notified and will start the 2 tablets. Patient states that he is taking medication correctly. Lab appointment has been scheduled.

## 2023-05-14 NOTE — Telephone Encounter (Signed)
Patient is calling to say that his thyroid levels are extremely abnormal per [facility] and they are telling him that he needs a sooner appointment than 06/07/2023 due to these levels.  The past labs were drawn last week when he was in the hospital.

## 2023-06-07 ENCOUNTER — Ambulatory Visit: Payer: Medicare Other | Admitting: Internal Medicine

## 2023-06-07 ENCOUNTER — Encounter: Payer: Self-pay | Admitting: Internal Medicine

## 2023-06-07 VITALS — BP 124/82 | HR 71 | Ht 64.0 in | Wt 158.0 lb

## 2023-06-07 DIAGNOSIS — F649 Gender identity disorder, unspecified: Secondary | ICD-10-CM

## 2023-06-07 DIAGNOSIS — E890 Postprocedural hypothyroidism: Secondary | ICD-10-CM | POA: Diagnosis not present

## 2023-06-07 NOTE — Progress Notes (Signed)
Name: Anthony Rowe  MRN/ DOB: [id number], [birth year]    Age/ Sex: 42 y.o., adult     PCP: Fleet Contras, MD   Reason for Endocrinology Evaluation: Gender dysphoria /hyperthyroidism     Initial Endocrinology Clinic Visit: 08/12/2018    PATIENT IDENTIFIER: Anthony Rowe is a 42 y.o., adult with a past medical history of Asthma, migraine headaches . He has followed with [facility] since 08/12/2018 for consultative assistance with management of his gender dysphoria and hyperthyroidism  HISTORICAL SUMMARY: The patient was first noted with gender dysphoria at age 16. ( Male to Male ) . Puberty was at age 84.    S/P musculizing breast sx in 2017  Gender affirming treatment started in 2015  No hx of DVT, dyslipidemia, liver or renal disease, OSA , CAD nor CVA.  S/P hysterectomy 11/2021  HYPERTHYROID HISTORY: Pt has been diagnosed with hyperthyroidism in 07/2018 NO prior XRT exposure nor neck sx  No prior amiodarone use    Methimazole started 08/2018 Pt had an uptake and scan in 05/2019 with an elevated  24-hr uptake of 75% of I-131 S/P RAI ablation on 06/16/2019 with 12.3 mCI of I-131 Pt was started on LT-4 replacement by 09/2019 No FH of thyroid disease   Pt was seen by Dr. Everardo All 10/19 until 06/2021   SUBJECTIVE:   Today (06/07/2023):  Mr. Silver is here for a follow up on gender dysphoria and postablative hypothyroidism   Patient is accompanied by his 1-year-old child who has a diagnosis of autism  Pt follows with plastic sx for pending phalloplasty, this has been postponed due to weight gain  Last testosterone injection was today  Denies rash  Denies acne   Denies local neck swelling  Patient continues to follow-up with the weight loss clinic, intolerant to [facility], he was restarted on Ozempic   HOME ENDOCRINE MEDICATIONS: Levothyroxine 137 mcg, 2 tabs daily  Testosterone cypionate 50 mg every 14 days        HISTORY:  Past Medical  History:  Past Medical History:  Diagnosis Date   Asthma    Bronchitis    COVID-19    Endometriosis    Hypertension    Migraine    Thyroid disease    hyperthyroidism per pt report   Past Surgical History:  Past Surgical History:  Procedure Laterality Date   ABDOMINAL HYSTERECTOMY     ANKLE SURGERY     MASTECTOMY Bilateral 2017   male to male    Social History:  reports that he has never smoked. He has never used smokeless tobacco. He reports that he does not drink alcohol and does not use drugs. Family History:  Family History  Problem Relation Age of Onset   Cancer Mother        lung   Hypertension Maternal Grandmother    Diabetes Maternal Grandmother    Thyroid disease Neg Hx      HOME MEDICATIONS: Allergies as of 06/07/2023       Reactions   Frovatriptan Other (See Comments)   Numbness in both legs   Sulfa Antibiotics Other (See Comments)   Makes patients legs numb         Medication List        Accurate as of June 07, 2023  3:44 PM. If you have any questions, ask your nurse or doctor.          albuterol 108 (90 Base) MCG/ACT inhaler Commonly known as: VENTOLIN HFA Inhale 1-2 puffs  into the lungs every 6 (six) hours as needed for wheezing or shortness of breath.   buPROPion 150 MG 24 hr tablet Commonly known as: WELLBUTRIN XL Take 150 mg by mouth daily.   busPIRone 10 MG tablet Commonly known as: BUSPAR Take 10 mg by mouth 2 (two) times daily.   Flovent HFA 220 MCG/ACT inhaler Generic drug: fluticasone Inhale 2 puffs into the lungs twice daily   fluticasone 50 MCG/ACT nasal spray Commonly known as: FLONASE Place 1 spray into both nostrils daily. What changed:  when to take this reasons to take this   gabapentin 100 MG capsule Commonly known as: NEURONTIN Take 200 mg by mouth at bedtime.   hydrOXYzine 25 MG tablet Commonly known as: ATARAX Take 1-2 tablets (25-50 mg total) by mouth at bedtime as needed. What changed: reasons to  take this   levocetirizine 5 MG tablet Commonly known as: XYZAL Take 1 tablet (5 mg total) by mouth 2 (two) times daily. What changed:  when to take this reasons to take this   levothyroxine 137 MCG tablet Commonly known as: SYNTHROID Take 2 tablets (274 mcg total) by mouth daily before breakfast.   lidocaine 4 % Commonly known as: HM Lidocaine Patch Apply 1 patch topically every 12 (twelve) hours as needed.   loperamide 2 MG capsule Commonly known as: IMODIUM Take 1 capsule (2 mg total) by mouth daily as needed for diarrhea or loose stools.   metoprolol succinate 25 MG 24 hr tablet Commonly known as: TOPROL-XL Take 1 tablet (25 mg total) by mouth daily. Take with or immediately following a meal.   mirtazapine 15 MG tablet Commonly known as: REMERON Take 15 mg by mouth at bedtime.   montelukast 10 MG tablet Commonly known as: SINGULAIR Take 1 tablet (10 mg total) by mouth at bedtime.   Mounjaro 2.5 MG/0.5ML Pen Generic drug: tirzepatide Inject 2.5 mg into the skin once a week.   nitrofurantoin (macrocrystal-monohydrate) 100 MG capsule Commonly known as: MACROBID Take 1 capsule (100 mg total) by mouth 2 (two) times daily.   nystatin cream Commonly known as: MYCOSTATIN Apply to affected area 2 times daily   ondansetron 8 MG disintegrating tablet Commonly known as: ZOFRAN-ODT Take 1 tablet (8 mg total) by mouth every 8 (eight) hours as needed for nausea or vomiting.   Oxycodone HCl 10 MG Tabs Take 10 mg by mouth 4 (four) times daily as needed.   rizatriptan 10 MG tablet Commonly known as: MAXALT Take 10 mg by mouth daily as needed for migraine.   Syringe/Needle (Disp) 22G X 1-1/2" 1 ML Misc 1 Device by Does not apply route every 14 (fourteen) days.   Testosterone Cypionate 200 MG/ML Soln Inject 50 mg as directed every 14 (fourteen) days.   traZODone 100 MG tablet Commonly known as: DESYREL Take 100 mg by mouth at bedtime as needed for sleep.    triamcinolone cream 0.1 % Commonly known as: KENALOG Apply 1 application topically 4 (four) times daily. As needed for itching What changed:  when to take this reasons to take this additional instructions          OBJECTIVE:   PHYSICAL EXAM: VS: BP 124/82 (BP Location: Left Arm, Patient Position: Sitting, Cuff Size: Large)   Pulse 71   Ht 5\' 4"  (1.626 m)   Wt 158 lb (71.7 kg)   SpO2 99%   BMI 27.12 kg/m    EXAM: General: Pt appears well and is in NAD  Neck: General: Supple  without adenopathy. Thyroid: Thyroid size normal.  No goiter or nodules appreciated.  Lungs: Clear with good BS bilat with no rales, rhonchi, or wheezes  Heart: Auscultation: RRR.  Abdomen: Normoactive bowel sounds, soft, nontender, without masses or organomegaly palpable  Extremities:  BL LE: No pretibial edema normal ROM and strength.  Mental Status: Judgment, insight: Intact Orientation: Oriented to time, place, and person Mood and affect: No depression, anxiety, or agitation     DATA REVIEWED:   Latest Reference Range & Units 05/13/23 17:26  Sodium 135 - 145 mmol/L 141  Potassium 3.5 - 5.1 mmol/L 4.4  Chloride 98 - 111 mmol/L 105  CO2 22 - 32 mmol/L 28  Glucose 70 - 99 mg/dL 161 (H)  BUN 6 - 20 mg/dL 11  Creatinine 0.96 - 0.45 mg/dL 4.09 (H)  Calcium 8.9 - 10.3 mg/dL 9.6  Anion gap 5 - 15  8  GFR, Estimated >60 mL/min >60    Latest Reference Range & Units 05/13/23 17:26  WBC 4.0 - 10.5 K/uL 5.2  RBC 4.22 - 5.81 MIL/uL 5.31  Hemoglobin 13.0 - 17.0 g/dL 81.1  HCT 91.4 - 78.2 % 48.2  MCV 80.0 - 100.0 fL 90.8  MCH 26.0 - 34.0 pg 30.7  MCHC 30.0 - 36.0 g/dL 95.6  RDW 21.3 - 08.6 % 13.6  Platelets 150 - 400 K/uL 312  nRBC 0.0 - 0.2 % 0.0  Neutrophils % 60  Lymphocytes % 29  Monocytes Relative % 9  Eosinophil % 1  Basophil % 1  Immature Granulocytes % 0  NEUT# 1.7 - 7.7 K/uL 3.1  Lymphocyte # 0.7 - 4.0 K/uL 1.5  Monocyte # 0.1 - 1.0 K/uL 0.4  Eosinophils Absolute 0.0 - 0.5  K/uL 0.0  Basophils Absolute 0.0 - 0.1 K/uL 0.1  Abs Immature Granulocytes 0.00 - 0.07 K/uL 0.01    Latest Reference Range & Units 05/03/23 12:40  TSH 0.40 - 4.50 mIU/L 60.59 (H)  (H): Data is abnormally high   Old records , labs and images have been reviewed.   ASSESSMENT / PLAN / RECOMMENDATIONS:   Gender Dysphoria   - F- M -Status post mastectomy and hysterectomy.  Patient is following up with [facility] for weight loss and preparation for phalloplasty -Last dose of testosterone was today, patient will return on 8/27 for repeat testing -No changes at this time  Medications  Continue testosterone 50 mg q. 14 days IM  2. Postablative Hypothyroidism:  -S/P RAI ablation in 2020 -His last TSH was extremely elevated at 60 uIU/mL, I have doubled up on his levothyroxine as below -Patient will return for repeat labs 8/27 -Patient ensures me compliance, and having a pillbox -- Pt educated extensively on the correct way to take levothyroxine (first thing in the morning with water, 30 minutes before eating or taking other medications). - Pt encouraged to double dose the following day if she were to miss a dose given long half-life of levothyroxine.     Medication Continue levothyroxine 137 mcg , 2 tabs  daily      Follow-up in 6 months   Signed electronically by: Lyndle Herrlich, MD  [facility]  [facility] [address] [place], [place] [address] Phone: [phone] FAX: [phone]      CC: Fleet Contras, MD [address] [place] [address] Phone: [phone]  Fax: [phone]   Return to Endocrinology clinic as below: Future Appointments  Date Time Provider Department Center  06/29/2023 10:00 AM [facility]  END[facility]NEURO LAB [facility] None  12/08/2023  2:40 PM , Konrad Dolores, MD [facility] None

## 2023-06-10 ENCOUNTER — Other Ambulatory Visit: Payer: Self-pay | Admitting: Internal Medicine

## 2023-06-20 ENCOUNTER — Emergency Department (HOSPITAL_COMMUNITY): Payer: Medicare Other

## 2023-06-20 ENCOUNTER — Emergency Department (HOSPITAL_COMMUNITY)
Admission: EM | Admit: 2023-06-20 | Discharge: 2023-06-20 | Disposition: A | Discharge status: Home / Self Care | Payer: Medicare Other | Source: Home / Self Care | Attending: Emergency Medicine | Admitting: Emergency Medicine

## 2023-06-20 ENCOUNTER — Other Ambulatory Visit: Payer: Self-pay

## 2023-06-20 ENCOUNTER — Encounter (HOSPITAL_COMMUNITY): Payer: Self-pay

## 2023-06-20 DIAGNOSIS — N23 Unspecified renal colic: Secondary | ICD-10-CM | POA: Insufficient documentation

## 2023-06-20 DIAGNOSIS — R1032 Left lower quadrant pain: Secondary | ICD-10-CM | POA: Diagnosis present

## 2023-06-20 DIAGNOSIS — T8619 Other complication of kidney transplant: Secondary | ICD-10-CM | POA: Diagnosis not present

## 2023-06-20 DIAGNOSIS — N2889 Other specified disorders of kidney and ureter: Secondary | ICD-10-CM

## 2023-06-20 LAB — URINALYSIS, ROUTINE W REFLEX MICROSCOPIC
Bilirubin Urine: NEGATIVE
Glucose, UA: NEGATIVE mg/dL
Hgb urine dipstick: NEGATIVE
Ketones, ur: 5 mg/dL — AB
Nitrite: NEGATIVE
Protein, ur: NEGATIVE mg/dL
Specific Gravity, Urine: 1.018 (ref 1.005–1.030)
pH: 6 (ref 5.0–8.0)

## 2023-06-20 LAB — CBC
HCT: 44.7 % (ref 39.0–52.0)
Hemoglobin: 15 g/dL (ref 13.0–17.0)
MCH: 30.4 pg (ref 26.0–34.0)
MCHC: 33.6 g/dL (ref 30.0–36.0)
MCV: 90.5 fL (ref 80.0–100.0)
Platelets: 298 10*3/uL (ref 150–400)
RBC: 4.94 MIL/uL (ref 4.22–5.81)
RDW: 13 % (ref 11.5–15.5)
WBC: 6.2 10*3/uL (ref 4.0–10.5)
nRBC: 0 % (ref 0.0–0.2)

## 2023-06-20 LAB — COMPREHENSIVE METABOLIC PANEL
ALT: 20 U/L (ref 0–44)
AST: 22 U/L (ref 15–41)
Albumin: 4 g/dL (ref 3.5–5.0)
Alkaline Phosphatase: 61 U/L (ref 38–126)
Anion gap: 12 (ref 5–15)
BUN: 11 mg/dL (ref 6–20)
CO2: 25 mmol/L (ref 22–32)
Calcium: 9.8 mg/dL (ref 8.9–10.3)
Chloride: 106 mmol/L (ref 98–111)
Creatinine, Ser: 1.15 mg/dL (ref 0.61–1.24)
GFR, Estimated: >60 mL/min (ref >60)
Glucose, Bld: 95 mg/dL (ref 70–99)
Potassium: 3.8 mmol/L (ref 3.5–5.1)
Sodium: 143 mmol/L (ref 135–145)
Total Bilirubin: 0.8 mg/dL (ref 0.3–1.2)
Total Protein: 6.7 g/dL (ref 6.5–8.1)

## 2023-06-20 LAB — LIPASE, BLOOD: Lipase: 24 U/L (ref 11–51)

## 2023-06-20 MED ORDER — HYOSCYAMINE SULFATE 0.125 MG SL SUBL
0.1250 mg | SUBLINGUAL_TABLET | Freq: Once | SUBLINGUAL | Status: AC
  Administered 2023-06-20: 0.125 mg via SUBLINGUAL
  Filled 2023-06-20: qty 1

## 2023-06-20 MED ORDER — HYDROMORPHONE HCL 1 MG/ML IJ SOLN
0.5000 mg | Freq: Once | INTRAMUSCULAR | Status: DC
  Filled 2023-06-20: qty 1

## 2023-06-20 MED ORDER — IOHEXOL 350 MG/ML SOLN
75.0000 mL | Freq: Once | INTRAVENOUS | Status: AC | PRN
  Administered 2023-06-20: 75 mL via INTRAVENOUS

## 2023-06-20 MED ORDER — HYOSCYAMINE SULFATE 0.125 MG PO TABS
0.1250 mg | ORAL_TABLET | Freq: Once | ORAL | Status: DC
  Filled 2023-06-20 (×2): qty 1

## 2023-06-20 MED ORDER — ONDANSETRON HCL 4 MG/2ML IJ SOLN
4.0000 mg | Freq: Once | INTRAMUSCULAR | Status: AC
  Administered 2023-06-20: 4 mg via INTRAVENOUS
  Filled 2023-06-20: qty 2

## 2023-06-20 MED ORDER — ONDANSETRON 4 MG PO TBDP
4.0000 mg | ORAL_TABLET | Freq: Once | ORAL | Status: AC
  Administered 2023-06-20: 4 mg via ORAL
  Filled 2023-06-20: qty 1

## 2023-06-20 MED ORDER — KETOROLAC TROMETHAMINE 30 MG/ML IJ SOLN
30.0000 mg | Freq: Once | INTRAMUSCULAR | Status: AC
  Administered 2023-06-20: 30 mg via INTRAVENOUS
  Filled 2023-06-20: qty 1

## 2023-06-20 MED ORDER — KETOROLAC TROMETHAMINE 10 MG PO TABS: 10.0000 mg | ORAL_TABLET | Freq: Four times a day (QID) | ORAL | 0 refills | Status: DC | PRN

## 2023-06-20 MED ORDER — TAMSULOSIN HCL 0.4 MG PO CAPS: 0.4000 mg | ORAL_CAPSULE | Freq: Two times a day (BID) | ORAL | 0 refills | Status: DC

## 2023-06-20 MED ORDER — OXYCODONE HCL 5 MG PO TABS
15.0000 mg | ORAL_TABLET | Freq: Once | ORAL | Status: AC
  Administered 2023-06-20: 15 mg via ORAL
  Filled 2023-06-20: qty 3

## 2023-06-20 NOTE — Discharge Instructions (Signed)
Contact a health care provider if: You have a fever or chills. Your pee smells bad or looks cloudy. You have pain or burning when you pee. You have blood in your pee. Get help right away if: The pain in your flank or groin suddenly gets worse. You become confused or do not know the time of day, where you are, or who you are (become disoriented). You feel like you may faint or you faint. 

## 2023-06-20 NOTE — ED Provider Notes (Signed)
[place] [facility]MENT AT [facility] Provider Note   CSN: [id number] Arrival date & time: 06/20/23  1238     History  Chief Complaint  Patient presents with   Abdominal Pain   Emesis   Nausea    Anthony Rowe is a 42 y.o. adult who presents emergency department with a complaint of severe left lower quadrant abdominal pain.  He has a history of previous renal stone.  He had sharp pain in the left lower abdomen couple of days ago that resolved.  Today it came back on and he states "almost flipped my car it hurts so bad."  He complains of severe pain in the left lower quadrant worse with any movement, touch, coughing or laughing.  He is never had any pain like this before.  He denies fevers or chills.  He has a previous history of total abdominal hysterectomy.   Abdominal Pain Associated symptoms: vomiting   Emesis Associated symptoms: abdominal pain        Home Medications Prior to Admission medications   Medication Sig Start Date End Date Taking? Authorizing Provider  albuterol (VENTOLIN HFA) 108 (90 Base) MCG/ACT inhaler Inhale 1-2 puffs into the lungs every 6 (six) hours as needed for wheezing or shortness of breath. 12/09/22   Carlisle Beers, FNP  buPROPion (WELLBUTRIN XL) 150 MG 24 hr tablet Take 150 mg by mouth daily. 12/05/19   [provider]  busPIRone (BUSPAR) 10 MG tablet Take 10 mg by mouth 2 (two) times daily. 11/14/19   [provider]  fluticasone (FLONASE) 50 MCG/ACT nasal spray Place 1 spray into both nostrils daily. Patient taking differently: Place 1 spray into both nostrils daily as needed for allergies. 07/14/19   Georgetta Haber, NP  fluticasone (FLOVENT HFA) 220 MCG/ACT inhaler Inhale 2 puffs into the lungs twice daily 02/09/20   Marcelyn Bruins, MD  gabapentin (NEURONTIN) 100 MG capsule Take 200 mg by mouth at bedtime.  01/10/20   [provider]  hydrOXYzine (ATARAX/VISTARIL) 25 MG tablet Take 1-2  tablets (25-50 mg total) by mouth at bedtime as needed. Patient taking differently: Take 25-50 mg by mouth at bedtime as needed for anxiety or vomiting. 07/26/19   Marcelyn Bruins, MD  levocetirizine (XYZAL) 5 MG tablet Take 1 tablet (5 mg total) by mouth 2 (two) times daily. Patient taking differently: Take 5 mg by mouth daily as needed for allergies. 03/30/19   Marcelyn Bruins, MD  levothyroxine (SYNTHROID) 137 MCG tablet Take 2 tablets (274 mcg total) by mouth daily before breakfast. 05/14/23   Shamleffer, Konrad Dolores, MD  Lidocaine (HM LIDOCAINE PATCH) 4 % PTCH Apply 1 patch topically every 12 (twelve) hours as needed. 12/17/21   Haskel Schroeder, PA-C  loperamide (IMODIUM) 2 MG capsule Take 1 capsule (2 mg total) by mouth daily as needed for diarrhea or loose stools. 07/18/19   Wallis Bamberg, PA-C  metoprolol succinate (TOPROL-XL) 25 MG 24 hr tablet Take 1 tablet (25 mg total) by mouth daily. Take with or immediately following a meal. 05/19/20   Fayrene Helper, PA-C  mirtazapine (REMERON) 15 MG tablet Take 15 mg by mouth at bedtime. 05/28/23   [provider]  montelukast (SINGULAIR) 10 MG tablet Take 1 tablet (10 mg total) by mouth at bedtime. 07/26/19   Marcelyn Bruins, MD  MOUNJARO 2.5 MG/0.5ML Pen Inject 2.5 mg into the skin once a week. 05/24/23   [provider]  nitrofurantoin, macrocrystal-monohydrate, (MACROBID) 100  MG capsule Take 1 capsule (100 mg total) by mouth 2 (two) times daily. 12/12/22   Achille Rich, PA-C  nystatin cream (MYCOSTATIN) Apply to affected area 2 times daily 12/12/22   Achille Rich, PA-C  ondansetron (ZOFRAN-ODT) 8 MG disintegrating tablet Take 1 tablet (8 mg total) by mouth every 8 (eight) hours as needed for nausea or vomiting. 07/18/19   Wallis Bamberg, PA-C  Oxycodone HCl 10 MG TABS Take 10 mg by mouth 4 (four) times daily as needed. 02/15/23   [provider]  rizatriptan (MAXALT) 10 MG tablet Take 10 mg by mouth  daily as needed for migraine.  01/02/20   [provider]  Syringe/Needle, Disp, 22G X 1-1/2" 1 ML MISC 1 Device by Does not apply route every 14 (fourteen) days. 07/10/22   Shamleffer, Konrad Dolores, MD  testosterone cypionate (DEPOTESTOSTERONE CYPIONATE) 200 MG/ML injection INJECT 0.25ML INTO THE SKIN EVERY 14 DAYS AS DIRECTED 06/11/23   Shamleffer, Konrad Dolores, MD  Testosterone Cypionate 200 MG/ML SOLN Inject 50 mg as directed every 14 (fourteen) days. 12/01/22   Shamleffer, Konrad Dolores, MD  traZODone (DESYREL) 100 MG tablet Take 100 mg by mouth at bedtime as needed for sleep.     [provider]  triamcinolone cream (KENALOG) 0.1 % Apply 1 application topically 4 (four) times daily. As needed for itching Patient taking differently: Apply 1 application  topically 3 (three) times daily as needed (for rash). 07/26/19   Romero Belling, MD      Allergies    Frovatriptan and Sulfa antibiotics    Review of Systems   Review of Systems  Gastrointestinal:  Positive for abdominal pain and vomiting.    Physical Exam Updated Vital Signs BP (!) 145/87   Pulse 97   Temp 98.3 F (36.8 C) (Oral)   Resp 17   Ht 5\' 4"  (1.626 m)   Wt 71.2 kg   LMP  (LMP Unknown)   SpO2 100%   BMI 26.95 kg/m  Physical Exam Vitals and nursing note reviewed.  Constitutional:      General: He is not in acute distress.    Appearance: He is well-developed. He is not diaphoretic.  HENT:     Head: Normocephalic and atraumatic.     Right Ear: External ear normal.     Left Ear: External ear normal.     Nose: Nose normal.     Mouth/Throat:     Mouth: Mucous membranes are moist.  Eyes:     General: No scleral icterus.    Conjunctiva/sclera: Conjunctivae normal.  Cardiovascular:     Rate and Rhythm: Normal rate and regular rhythm.     Heart sounds: Normal heart sounds. No murmur heard.    No friction rub. No gallop.  Pulmonary:     Effort: Pulmonary effort is normal. No respiratory distress.      Breath sounds: Normal breath sounds.  Abdominal:     General: Bowel sounds are normal. There is no distension.     Palpations: Abdomen is soft. There is no mass.     Tenderness: There is abdominal tenderness in the left lower quadrant. There is guarding and rebound.    Musculoskeletal:     Cervical back: Normal range of motion.  Skin:    General: Skin is warm and dry.  Neurological:     Mental Status: He is alert and oriented to person, place, and time.  Psychiatric:        Behavior: Behavior normal.  ED Results / Procedures / Treatments   Labs (all labs ordered are listed, but only abnormal results are displayed) Labs Reviewed  URINALYSIS, ROUTINE W REFLEX MICROSCOPIC - Abnormal; Notable for the following components:      Result Value   Ketones, ur 5 (*)    Leukocytes,Ua TRACE (*)    Bacteria, UA RARE (*)    All other components within normal limits  LIPASE, BLOOD  COMPREHENSIVE METABOLIC PANEL  CBC    EKG None  Radiology No results found.  Procedures Procedures    Medications Ordered in ED Medications  ondansetron (ZOFRAN) injection 4 mg (has no administration in time range)  HYDROmorphone (DILAUDID) injection 0.5 mg (has no administration in time range)  ondansetron (ZOFRAN-ODT) disintegrating tablet 4 mg (4 mg Oral Given 06/20/23 1332)    ED Course/ Medical Decision Making/ A&P Clinical Course as of 06/20/23 1935  Sun Jun 20, 2023  1821 Urinalysis, Routine w reflex microscopic -Urine, Clean Catch(!) [AH]  1822 Lipase, blood [AH]  1822 CBC [AH]  1822 Comprehensive metabolic panel Labs unremarkable [AH]  1822 CT ABDOMEN PELVIS W CONTRAST I personally visualized and interpreted the images using our PACS Rowe. Acute findings include:  Distal left UVJ stone  [AH]    Clinical Course User Index [AH] Arthor Captain, PA-C                                 Medical Decision Making Patient presents with abd pain. The differential diagnosis for  generalized abdominal pain includes, but is not limited to AAA, gastroenteritis, appendicitis, Bowel obstruction, Bowel perforation. Gastroparesis, DKA, renal colic,  Hernia, Inflammatory bowel disease, mesenteric ischemia, pancreatitis, peritonitis SBP, volvulus.   Given the large differential diagnosis for Anthony Rowe, the decision making in this case is of high complexity.  After evaluating all of the data points in this case, the presentation of Anthony Rowe is  due to renal colic from 2mm obstructing uvj stone.  The presentation NOT consistent with an infected stone, nephric abscess, sepsis, or renal failure.  Similarly, this presentation is NOT consistent with AAA; Mesenteric Ischemia; Bowel Perforation; Bowel Obstruction; Sigmoid Volvulus; Diverticulitis; Appendicitis; Peritonitis; Cholecystitis, ascending cholangitis or other gallbladder disease; perforated ulcer; significant GI bleeding, splenic rupture/infarction; Hepatic abscess; or other surgical/acute abdomen.  Similarly, this presentation is NOT consistent with ACS or Myocardial Ischemia; Pulmonary Embolism; fistula; incarcerated hernia; Pancreatitis, Aortic Dissection; Diabetic Ketoacidosis; Ischemic colitis; Psoas or other abscess; Methanol poisoning; Heavy metal toxicity; or porphyria.  Similarly, this presentation is NOT consistent with acute coronary syndrome, pulmonary embolism, dissection, borhaave's, arrythmia, pneumothorax, cardiac tamponade, or other emergent cardiopulmonary condition.  Similarly, this presentation is NOT consistent with pyelonephritis, urinary infection, pneumonia, or other focal bacterial infection.   Strict return and follow-up precautions have been given by me personally or by detailed written instruction verbalized by nursing staff using the teach back method. to the patient/family/caregiver(s).  Data Reviewed/Counseling: I have reviwed the patient's vital signs, nursing notes, and other relevant  tests/information. I had a detailed discussion regarding the historical points, exam findings, and any diagnostic results supporting the discharge diagnosis. I also discussed the need for outpatient follow-up and the need to return to the ED if symptoms worsen or if there are any questions or concerns that arise at home.  Similarly, this presentation is not consistent with a AAA; Mesenteric Ischemia; Bowel Perforation; Bowel Obstruction; Sigmoid Volvulus; Diverticulitis; Appendicitis; Peritonitis; Cholecystitis, ascending cholangitis or other gallbladder disease; perforated ulcer; significant GI bleeding, splenic rupture/infarction; Hepatic abscess; GI bleeding, or other surgical/acute abdomen.  Similarly, this presentation is NOT consistent with ACS or Myocardial Ischemia; Pulmonary Embolism; fistula; incarcerated hernia; Pancreatitis, Aortic Dissection; Diabetic Ketoacidosis; Ischemic colitis; Psoas or other abscess; Methanol poisoning; Heavy metal toxicity; or porphyria.  Similarly, this case is NOT consistent with testicular torsion, prostatitis, hernia, STI, or other testicular issue.  Similarly, this presentation is NOT consistent with acute coronary syndrome, pulmonary embolism, dissection, borhaave's, arrythmia, pneumothorax, cardiac tamponade, or other emergent cardiopulmonary condition.  Similarly, this presentation is NOT consistent with sepsis, pyelonephritis, urinary infection, pneumonia, or other focal bacterial infection.    Amount and/or Complexity of Data Reviewed Labs: ordered. Decision-making details documented in ED Course. Radiology: ordered and independent interpretation performed. Decision-making details documented in ED Course.  Risk Prescription drug management.          Final Clinical Impression(s) / ED Diagnoses Final diagnoses:  Ureteral colic  Ureteral arterial hemorrhage of kidney transplant    Rx / DC  Orders ED Discharge Orders     None         Arthor Captain, PA-C 06/20/23 1937    Gloris Manchester, MD 06/21/23 [phone]

## 2023-06-20 NOTE — ED Triage Notes (Signed)
Pt. Stated, Anthony Rowe had lower left abdominal pain since 40 min ago with some N/V with sweating. , sharpe pain that bends me over. I had some of the pain 2 days ago.

## 2023-06-29 ENCOUNTER — Other Ambulatory Visit (INDEPENDENT_AMBULATORY_CARE_PROVIDER_SITE_OTHER): Payer: Medicare Other

## 2023-06-29 DIAGNOSIS — E890 Postprocedural hypothyroidism: Secondary | ICD-10-CM | POA: Diagnosis not present

## 2023-06-29 DIAGNOSIS — F649 Gender identity disorder, unspecified: Secondary | ICD-10-CM

## 2023-06-29 LAB — TSH: TSH: <0.01 u[IU]/mL — ABNORMAL LOW (ref 0.35–5.50)

## 2023-06-30 ENCOUNTER — Ambulatory Visit (INDEPENDENT_AMBULATORY_CARE_PROVIDER_SITE_OTHER): Payer: Medicare Other

## 2023-06-30 ENCOUNTER — Ambulatory Visit
Admission: EM | Admit: 2023-06-30 | Discharge: 2023-06-30 | Disposition: A | Discharge status: Home / Self Care | Payer: Medicare Other | Attending: Internal Medicine | Admitting: Internal Medicine

## 2023-06-30 ENCOUNTER — Telehealth: Payer: Self-pay | Admitting: Internal Medicine

## 2023-06-30 DIAGNOSIS — M25532 Pain in left wrist: Secondary | ICD-10-CM | POA: Diagnosis not present

## 2023-06-30 DIAGNOSIS — M545 Low back pain, unspecified: Secondary | ICD-10-CM

## 2023-06-30 DIAGNOSIS — S46912A Strain of unspecified muscle, fascia and tendon at shoulder and upper arm level, left arm, initial encounter: Secondary | ICD-10-CM

## 2023-06-30 DIAGNOSIS — S39012A Strain of muscle, fascia and tendon of lower back, initial encounter: Secondary | ICD-10-CM

## 2023-06-30 DIAGNOSIS — E890 Postprocedural hypothyroidism: Secondary | ICD-10-CM

## 2023-06-30 MED ORDER — LEVOTHYROXINE SODIUM 175 MCG PO TABS: 175.0000 ug | ORAL_TABLET | Freq: Every day | ORAL | 3 refills | Status: DC

## 2023-06-30 MED ORDER — CYCLOBENZAPRINE HCL 10 MG PO TABS
10.0000 mg | ORAL_TABLET | Freq: Two times a day (BID) | ORAL | 0 refills | Status: DC | PRN
Start: 2023-06-30 — End: 2024-06-08

## 2023-06-30 MED ORDER — IBUPROFEN 800 MG PO TABS
800.0000 mg | ORAL_TABLET | Freq: Once | ORAL | Status: AC
  Administered 2023-06-30: 800 mg via ORAL

## 2023-06-30 MED ORDER — NAPROXEN 375 MG PO TABS
375.0000 mg | ORAL_TABLET | Freq: Two times a day (BID) | ORAL | 0 refills | Status: AC | PRN
Start: 2023-06-30 — End: ?
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
## 2023-06-30 NOTE — Discharge Instructions (Addendum)
You may take Flexeril as needed.  Please have this medication will make you tired.  Do not drink alcohol or drive while on this medication.  Do not take your oxycodone if you will be taking Flexeril as this can cause sedation and breathing difficulties.  May also take naproxen twice daily as needed for your muscle pain.  Heat to the affected areas and rest.  Please follow-up with your PCP in 2 days for recheck.  Please go to the emergency room if you develop any worsening symptoms.  I hope you feel better soon!
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
## 2023-06-30 NOTE — Telephone Encounter (Signed)
Please let the pt know that he is on too much levothyroxine now and to STOP Levothyroxine 137 2 tabs daily and START Levothryoxine 175 mcg ONE tablet daily    Recheck labs in 6 weeks    Pt advise the pt to use a "PILL box " for levothyroxine , that way if he realizes he missed 1 dose, he may double up the next day.

## 2023-06-30 NOTE — Telephone Encounter (Signed)
Attempted to contact patient no answer and no vm set up  

## 2023-06-30 NOTE — Telephone Encounter (Signed)
Sent mychart message as well

## 2023-07-03 LAB — TESTOSTERONE, TOTAL, LC/MS/MS: Testosterone, Total, LC-MS-MS: 81 ng/dL — ABNORMAL LOW (ref 250–1100)

## 2023-07-06 MED ORDER — TESTOSTERONE CYPIONATE 200 MG/ML IJ SOLN: 100.0000 mg | INTRAMUSCULAR | 5 refills | Status: DC

## 2023-07-25 IMAGING — CT CT ANGIO CHEST
2 of 6 series · 18 of 36 positions shown · IV contrast (OMNIPAQUE 350)
Comparison: None.

CLINICAL DATA: PE suspected, left-sided chest pain shortness of
breath

EXAM:
CT ANGIOGRAPHY CHEST WITH CONTRAST
TECHNIQUE: Multidetector CT imaging of the chest was performed using the
standard protocol during bolus administration of intravenous
contrast. Multiplanar CT image reconstructions and MIPs were
obtained to evaluate the vascular anatomy.

[Series 5: thins · axial · 0.63mm/px · z∈[-227,-16]mm · 17 of 239 slices shown]
[im 14/239  lung]
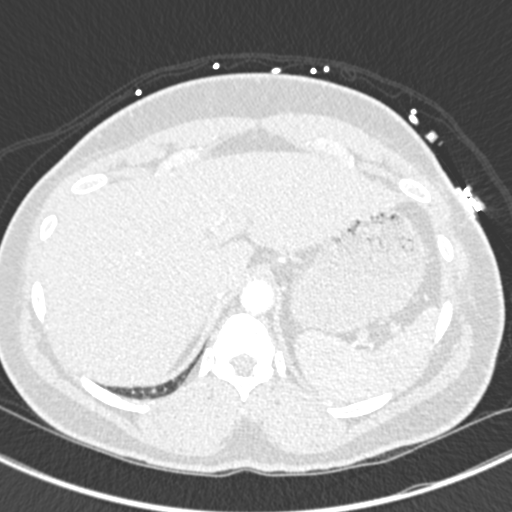
[im 27/239  mediastinal]
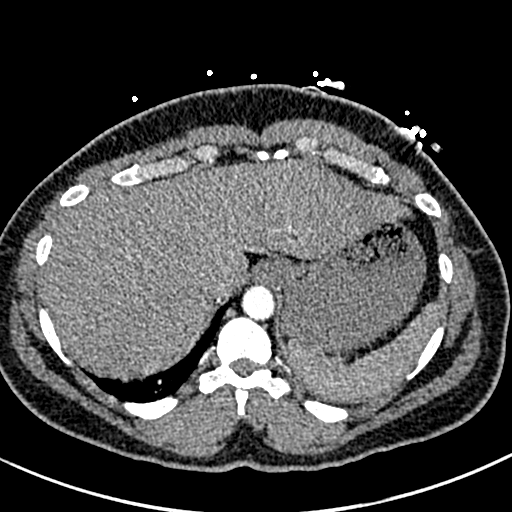
[im 40/239  lung]
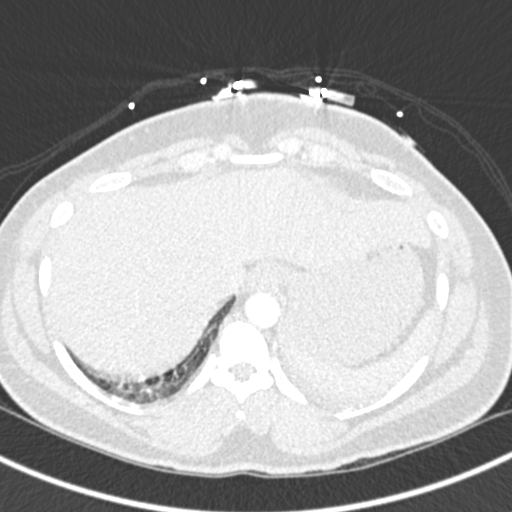
[im 53/239  mediastinal]
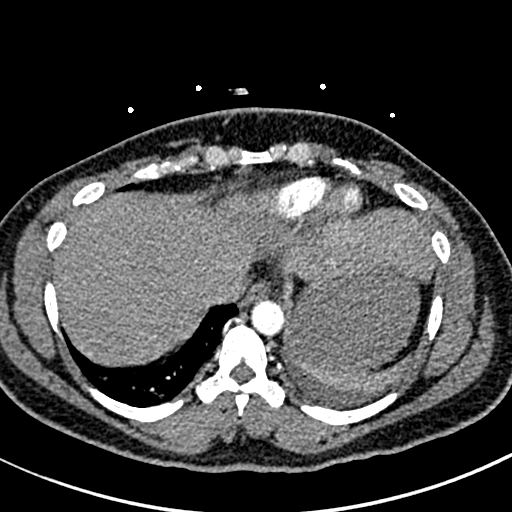
[im 67/239  lung]
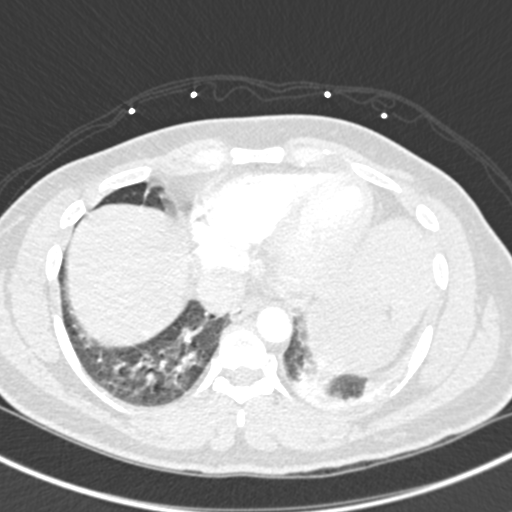
[im 80/239  mediastinal]
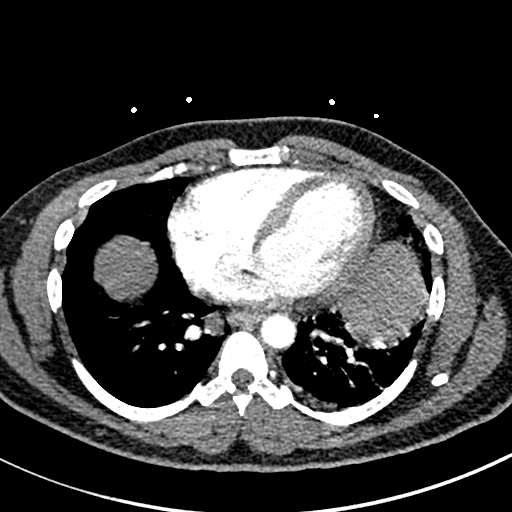
[im 93/239  lung]
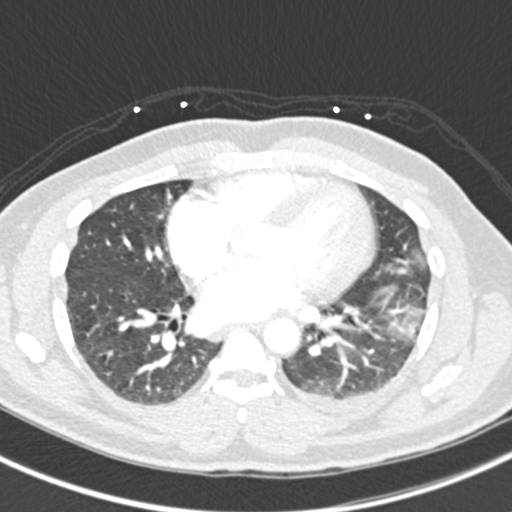
[im 106/239  mediastinal]
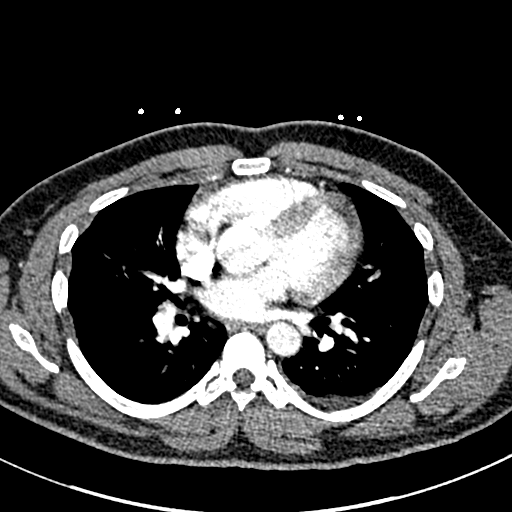
[im 120/239  lung]
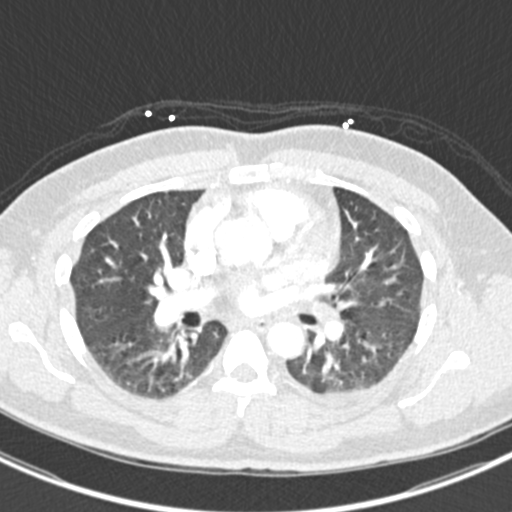
[im 133/239  mediastinal]
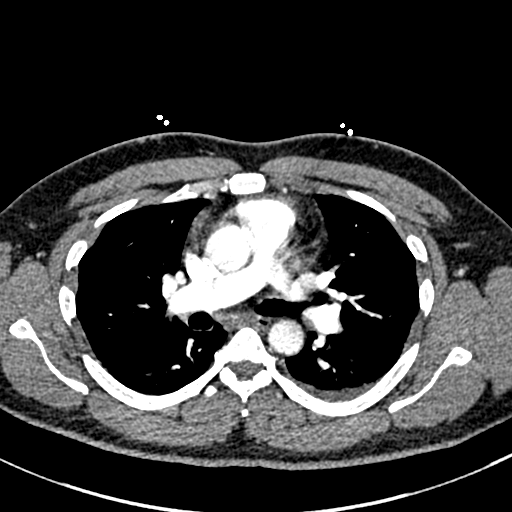
[im 146/239  lung]
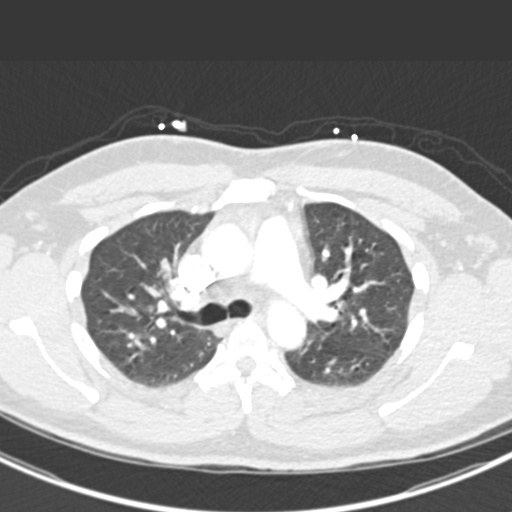
[im 159/239  mediastinal]
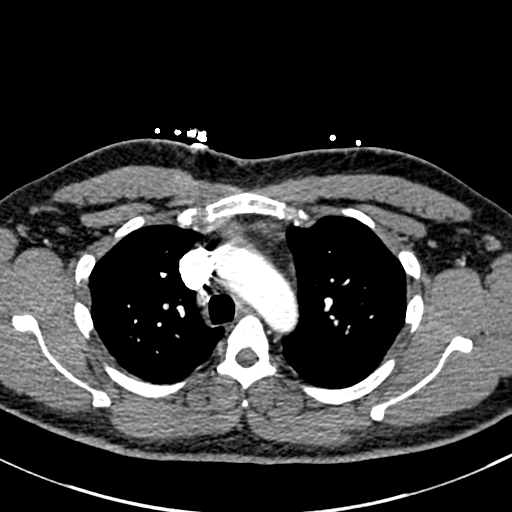
[im 172/239  lung]
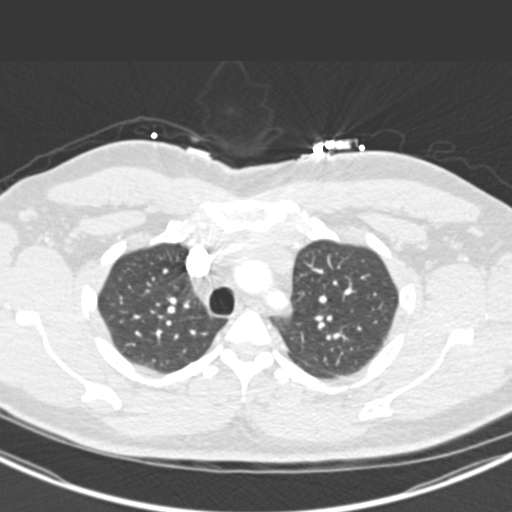
[im 186/239  mediastinal]
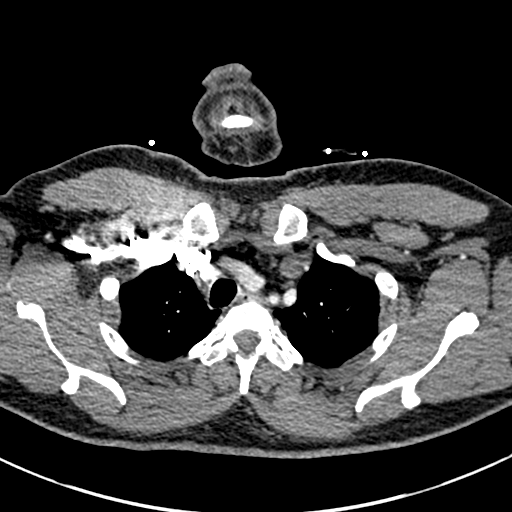
[im 199/239  lung]
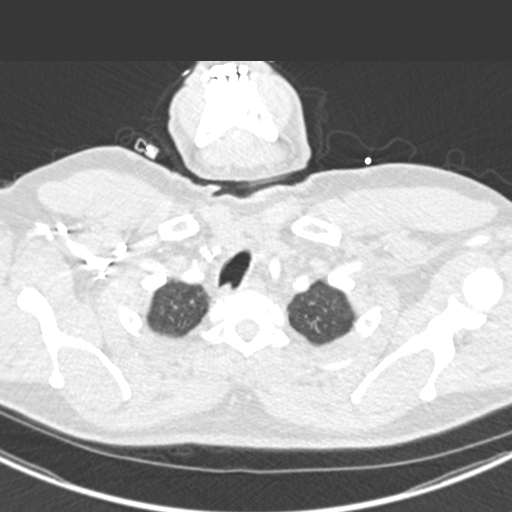
[im 212/239  mediastinal]
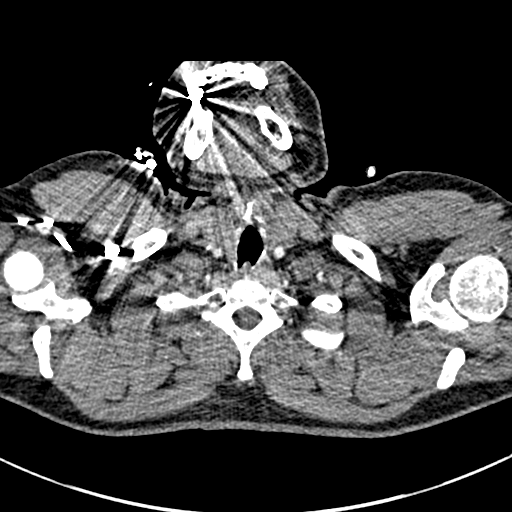
[im 225/239  lung]
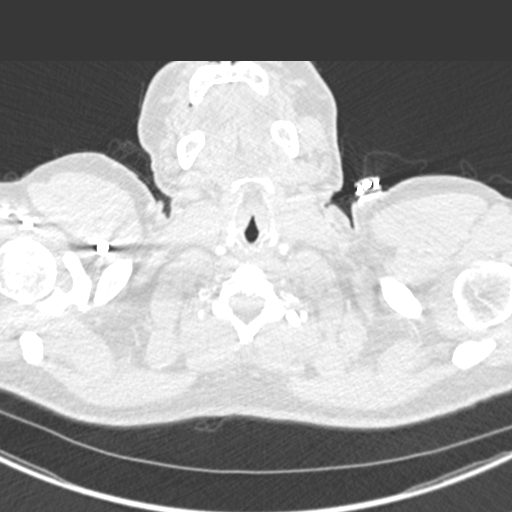

[Series 7: coronal mpr · coronal · 0.50mm/px · 1 of 137 slices shown]
[im 69/137  mediastinal]
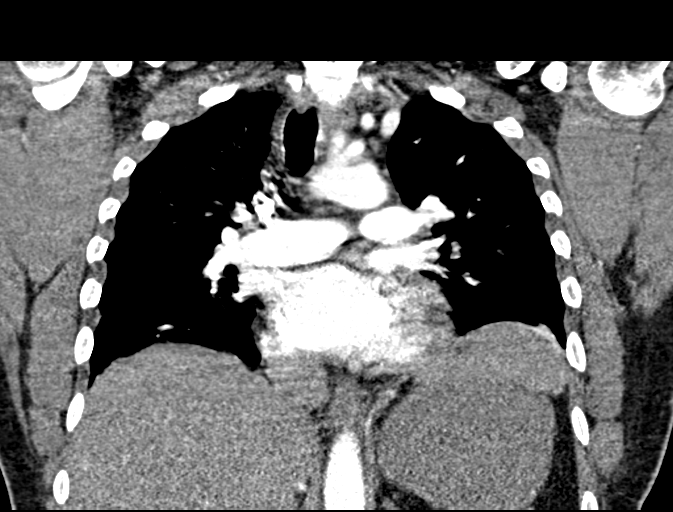

[18 of 36 positions shown; findings below may reference images not displayed]

RADIATION DOSE REDUCTION: This exam was performed according to the
departmental dose-optimization program which includes automated
exposure control, adjustment of the mA and/or kV according to
patient size and/or use of iterative reconstruction technique.

CONTRAST:  64mL OMNIPAQUE IOHEXOL 350 MG/ML SOLN
FINDINGS: Cardiovascular: Examination for pulmonary embolism is limited by
breath motion artifact, particularly in the lung bases. Within this
limitation, no pulmonary embolism identified to the proximal
segmental pulmonary arterial level. Normal heart size. No
pericardial effusion.

Mediastinum/Nodes: No enlarged mediastinal, hilar, or axillary lymph
nodes. Thyroid gland, trachea, and esophagus demonstrate no
significant findings.

Lungs/Pleura: Small left pleural effusion associated atelectasis or
consolidation.

Upper Abdomen: No acute abnormality.

Musculoskeletal: No chest wall abnormality. No acute osseous
findings.

Review of the MIP images confirms the above findings.
IMPRESSION: 1. Examination for pulmonary embolism is limited by breath motion
artifact, particularly in the lung bases. Within this limitation, no
pulmonary embolism identified to the proximal segmental pulmonary
arterial level.
2. Small left pleural effusion and associated atelectasis or
consolidation.

## 2023-07-27 ENCOUNTER — Telehealth: Payer: Self-pay | Admitting: Internal Medicine

## 2023-07-27 NOTE — Telephone Encounter (Signed)
I [facility] contacted [facility] [address] and canceled prescription. They state [facility] and the refill were transfer to them per patient request. [facility] the Test[facility]

## 2023-07-27 NOTE — Telephone Encounter (Signed)
Patient states [facility] and they wont transfer her RX to the other one. [facility] [address] in [place].

## 2023-07-28 MED ORDER — TESTOSTERONE CYPIONATE 200 MG/ML IJ SOLN: 100.0000 mg | INTRAMUSCULAR | 5 refills | Status: DC

## 2023-07-28 NOTE — Telephone Encounter (Signed)
Done

## 2023-08-11 ENCOUNTER — Other Ambulatory Visit: Payer: Medicare Other

## 2023-10-13 ENCOUNTER — Other Ambulatory Visit: Payer: Self-pay

## 2023-10-13 ENCOUNTER — Emergency Department (HOSPITAL_COMMUNITY): Payer: Medicare Other

## 2023-10-13 ENCOUNTER — Emergency Department (HOSPITAL_COMMUNITY)
Admission: EM | Admit: 2023-10-13 | Discharge: 2023-10-14 | Discharge status: Against Medical Advice | Payer: Medicare Other | Attending: Emergency Medicine | Admitting: Emergency Medicine

## 2023-10-13 DIAGNOSIS — Z5321 Procedure and treatment not carried out due to patient leaving prior to being seen by health care provider: Secondary | ICD-10-CM | POA: Diagnosis not present

## 2023-10-13 DIAGNOSIS — R079 Chest pain, unspecified: Secondary | ICD-10-CM | POA: Diagnosis present

## 2023-10-13 DIAGNOSIS — R0602 Shortness of breath: Secondary | ICD-10-CM | POA: Diagnosis not present

## 2023-10-13 LAB — BASIC METABOLIC PANEL
Anion gap: 11 (ref 5–15)
BUN: 12 mg/dL (ref 6–20)
CO2: 23 mmol/L (ref 22–32)
Calcium: 9.4 mg/dL (ref 8.9–10.3)
Chloride: 106 mmol/L (ref 98–111)
Creatinine, Ser: 1.01 mg/dL (ref 0.61–1.24)
GFR, Estimated: >60 mL/min (ref >60)
Glucose, Bld: 84 mg/dL (ref 70–99)
Potassium: 3.4 mmol/L — ABNORMAL LOW (ref 3.5–5.1)
Sodium: 140 mmol/L (ref 135–145)

## 2023-10-13 LAB — CBC
HCT: 41.2 % (ref 39.0–52.0)
Hemoglobin: 13.9 g/dL (ref 13.0–17.0)
MCH: 29.2 pg (ref 26.0–34.0)
MCHC: 33.7 g/dL (ref 30.0–36.0)
MCV: 86.6 fL (ref 80.0–100.0)
Platelets: 337 10*3/uL (ref 150–400)
RBC: 4.76 MIL/uL (ref 4.22–5.81)
RDW: 12.9 % (ref 11.5–15.5)
WBC: 4.9 10*3/uL (ref 4.0–10.5)
nRBC: 0 % (ref 0.0–0.2)

## 2023-10-13 LAB — HCG, SERUM, QUALITATIVE: Preg, Serum: NEGATIVE

## 2023-10-13 LAB — TROPONIN I (HIGH SENSITIVITY): Troponin I (High Sensitivity): 5 ng/L (ref <18)

## 2023-10-13 NOTE — ED Notes (Signed)
When getting 2nd trop pt stated still very short of breath and left arm is numb

## 2023-10-13 NOTE — ED Triage Notes (Signed)
Patient reports central chest tightness with SOB this evening , no cough or fever , denies emesis or diaphoresis .

## 2023-10-14 LAB — TROPONIN I (HIGH SENSITIVITY): Troponin I (High Sensitivity): 6 ng/L (ref <18)

## 2023-10-14 NOTE — ED Notes (Signed)
[facility] has been called and no answer x2. Pt taken OTF.

## 2023-12-08 ENCOUNTER — Ambulatory Visit: Payer: Medicare Other | Admitting: Internal Medicine

## 2023-12-24 ENCOUNTER — Ambulatory Visit: Payer: Medicare Other | Admitting: Internal Medicine

## 2023-12-24 NOTE — Progress Notes (Deleted)
 Name: Anthony Rowe  MRN/ DOB: [id number], 03-May-1981    Age/ Sex: 43 y.o., adult     PCP: Fleet Contras, MD   Reason for Endocrinology Evaluation: Gender dysphoria /hyperthyroidism     Initial Endocrinology Clinic Visit: 08/12/2018    PATIENT IDENTIFIER: [name] is a 43 y.o., adult with a past medical history of Asthma, migraine headaches . He has followed with [facility] since 08/12/2018 for consultative assistance with management of his gender dysphoria and hyperthyroidism  HISTORICAL SUMMARY: The patient was first noted with gender dysphoria at age 45. ( Male to Male ) . Puberty was at age 53.    S/P musculizing breast sx in 2017  Gender affirming treatment started in 2015  No hx of DVT, dyslipidemia, liver or renal disease, OSA , CAD nor CVA.  S/P hysterectomy 11/2021  HYPERTHYROID HISTORY: Pt has been diagnosed with hyperthyroidism in 07/2018 NO prior XRT exposure nor neck sx  No prior amiodarone use    Methimazole started 08/2018 Pt had an uptake and scan in 05/2019 with an elevated  24-hr uptake of 75% of I-131 S/P RAI ablation on 06/16/2019 with 12.3 mCI of I-131 Pt was started on LT-4 replacement by 09/2019 No FH of thyroid disease   Pt was seen by Dr. Everardo All 10/19 until 06/2021   SUBJECTIVE:   Today (12/24/2023):  Mr. [name] is here for a follow up on gender dysphoria and postablative hypothyroidism     Pt follows with plastic sx for pending phalloplasty (scheduled/08/2024 ),this has been postponed due to weight gain   He is currently following up with [facility] surgery for weight management Last testosterone injection was today  Denies rash  Denies acne   Denies local neck swelling  Patient continues to follow-up with the weight loss clinic, intolerant to [facility], he was restarted on Ozempic   HOME ENDOCRINE MEDICATIONS: Levothyroxine 137 mcg, 2 tabs daily  Testosterone cypionate 100 mg (0.5 mL) every 14 days         HISTORY:  Past Medical History:  Past Medical History:  Diagnosis Date   Asthma    Bronchitis    COVID-19    Endometriosis    Hypertension    Migraine    Thyroid disease    hyperthyroidism per pt report   Past Surgical History:  Past Surgical History:  Procedure Laterality Date   ABDOMINAL HYSTERECTOMY     ANKLE SURGERY     MASTECTOMY Bilateral 2017   male to male    Social History:  reports that he has never smoked. He has never used smokeless tobacco. He reports that he does not drink alcohol and does not use drugs. Family History:  Family History  Problem Relation Age of Onset   Cancer Mother        lung   Hypertension Maternal Grandmother    Diabetes Maternal Grandmother    Thyroid disease Neg Hx      HOME MEDICATIONS: Allergies as of 12/24/2023       Reactions   Frovatriptan Other (See Comments)   Numbness in both legs   Dilaudid [hydromorphone Hcl]    Pt says medication make his legs go numb   Sulfa Antibiotics Other (See Comments)   Makes patients legs numb         Medication List        Accurate as of December 24, 2023  6:53 AM. If you have any questions, ask your nurse or doctor.  albuterol 108 (90 Base) MCG/ACT inhaler Commonly known as: VENTOLIN HFA Inhale 1-2 puffs into the lungs every 6 (six) hours as needed for wheezing or shortness of breath.   buPROPion 150 MG 24 hr tablet Commonly known as: WELLBUTRIN XL Take 150 mg by mouth daily.   busPIRone 10 MG tablet Commonly known as: BUSPAR Take 10 mg by mouth 2 (two) times daily.   cyclobenzaprine 10 MG tablet Commonly known as: FLEXERIL Take 1 tablet (10 mg total) by mouth 2 (two) times daily as needed for muscle spasms.   Flovent HFA 220 MCG/ACT inhaler Generic drug: fluticasone Inhale 2 puffs into the lungs twice daily   fluticasone 50 MCG/ACT nasal spray Commonly known as: FLONASE Place 1 spray into both nostrils daily. What changed:  when to take  this reasons to take this   hydrOXYzine 25 MG tablet Commonly known as: ATARAX Take 1-2 tablets (25-50 mg total) by mouth at bedtime as needed. What changed: reasons to take this   levocetirizine 5 MG tablet Commonly known as: XYZAL Take 1 tablet (5 mg total) by mouth 2 (two) times daily. What changed:  when to take this reasons to take this   levothyroxine 175 MCG tablet Commonly known as: SYNTHROID Take 1 tablet (175 mcg total) by mouth daily.   lidocaine 4 % Commonly known as: HM Lidocaine Patch Apply 1 patch topically every 12 (twelve) hours as needed.   loperamide 2 MG capsule Commonly known as: IMODIUM Take 1 capsule (2 mg total) by mouth daily as needed for diarrhea or loose stools.   metoprolol succinate 25 MG 24 hr tablet Commonly known as: TOPROL-XL Take 1 tablet (25 mg total) by mouth daily. Take with or immediately following a meal. What changed: how much to take   mirtazapine 15 MG tablet Commonly known as: REMERON Take 15 mg by mouth at bedtime.   montelukast 10 MG tablet Commonly known as: SINGULAIR Take 1 tablet (10 mg total) by mouth at bedtime.   Mounjaro 2.5 MG/0.5ML Pen Generic drug: tirzepatide Inject 2.5 mg into the skin once a week.   naproxen 375 MG tablet Commonly known as: NAPROSYN Take 1 tablet (375 mg total) by mouth 2 (two) times daily as needed (muscle pain).   nystatin cream Commonly known as: MYCOSTATIN Apply to affected area 2 times daily   ondansetron 8 MG disintegrating tablet Commonly known as: ZOFRAN-ODT Take 1 tablet (8 mg total) by mouth every 8 (eight) hours as needed for nausea or vomiting.   Oxycodone HCl 10 MG Tabs Take 15 mg by mouth 4 (four) times daily as needed.   Syringe/Needle (Disp) 22G X 1-1/2" 1 ML Misc 1 Device by Does not apply route every 14 (fourteen) days.   Testosterone Cypionate 200 MG/ML Soln Inject 100 mg as directed every 14 (fourteen) days.   traZODone 100 MG tablet Commonly known as:  DESYREL Take 100 mg by mouth at bedtime as needed for sleep.   triamcinolone cream 0.1 % Commonly known as: KENALOG Apply 1 application topically 4 (four) times daily. As needed for itching What changed:  when to take this reasons to take this additional instructions          OBJECTIVE:   PHYSICAL EXAM: VS: There were no vitals taken for this visit.   EXAM: General: Pt appears well and is in NAD  Neck: General: Supple without adenopathy. Thyroid: Thyroid size normal.  No goiter or nodules appreciated.  Lungs: Clear with good BS bilat with no  rales, rhonchi, or wheezes  Heart: Auscultation: RRR.  Abdomen: Normoactive bowel sounds, soft, nontender, without masses or organomegaly palpable  Extremities:  BL LE: No pretibial edema normal ROM and strength.  Mental Status: Judgment, insight: Intact Orientation: Oriented to time, place, and person Mood and affect: No depression, anxiety, or agitation     DATA REVIEWED:   Latest Reference Range & Units 06/29/23 10:18  Testosterone, Total, LC-MS-MS 250 - 1,100 ng/dL 81 (L)  TSH 4.09 - 8.11 uIU/mL <0.01 Repeated and verified X2. (L)    Latest Reference Range & Units 10/13/23 21:25  Sodium 135 - 145 mmol/L 140  Potassium 3.5 - 5.1 mmol/L 3.4 (L)  Chloride 98 - 111 mmol/L 106  CO2 22 - 32 mmol/L 23  Glucose 70 - 99 mg/dL 84  BUN 6 - 20 mg/dL 12  Creatinine 9.14 - 7.82 mg/dL 9.56  Calcium 8.9 - 21.3 mg/dL 9.4  Anion gap 5 - 15  11  GFR, Estimated >60 mL/min >60    Latest Reference Range & Units 10/13/23 21:25  WBC 4.0 - 10.5 K/uL 4.9  RBC 4.22 - 5.81 MIL/uL 4.76  Hemoglobin 13.0 - 17.0 g/dL 08.6  HCT 57.8 - 46.9 % 41.2  MCV 80.0 - 100.0 fL 86.6  MCH 26.0 - 34.0 pg 29.2  MCHC 30.0 - 36.0 g/dL 62.9  RDW 52.8 - 41.3 % 12.9  Platelets 150 - 400 K/uL 337  nRBC 0.0 - 0.2 % 0.0     Old records , labs and images have been reviewed.   ASSESSMENT / PLAN / RECOMMENDATIONS:   Gender Dysphoria   - F- M -Status post  mastectomy and hysterectomy.  Patient is following up with [facility] for weight loss and preparation for phalloplasty -Last dose of testosterone was today, patient will return on 8/27 for repeat testing -No changes at this time  Medications  Continue testosterone 50 mg q. 14 days IM  2. Postablative Hypothyroidism:  -S/P RAI ablation in 2020 -His last TSH was extremely elevated at 60 uIU/mL, I have doubled up on his levothyroxine as below -Patient will return for repeat labs 8/27 -Patient ensures me compliance, and having a pillbox -- Pt educated extensively on the correct way to take levothyroxine (first thing in the morning with water, 30 minutes before eating or taking other medications). - Pt encouraged to double dose the following day if she were to miss a dose given long half-life of levothyroxine.     Medication Continue levothyroxine 137 mcg , 2 tabs  daily      Follow-up in 6 months   Signed electronically by: Lyndle Herrlich, MD  [facility] Endocrinology  [facility] [address] [place] [place], [place] [address] Phone: [phone] FAX: [phone]      CC: Fleet Contras, MD [address] [place] [address] Phone: [phone]  Fax: [phone]   Return to Endocrinology clinic as below: Future Appointments  Date Time Provider Department Center  12/24/2023 11:50 AM Zakeya Junker, Konrad Dolores, MD [facility] None

## 2024-02-08 ENCOUNTER — Ambulatory Visit: Payer: Medicare Other | Admitting: Internal Medicine

## 2024-02-08 NOTE — Progress Notes (Deleted)
 Name: Anthony Rowe Rowe  MRN/ DOB: [id number], Feb 06, 1981    Age/ Sex: 43 y.o., adult     PCP: Fleet Contras, MD   Reason for Endocrinology Evaluation: Gender dysphoria /hyperthyroidism     Initial Endocrinology Clinic Visit: 08/12/2018    PATIENT IDENTIFIER: Anthony Rowe Rowe is a 43 y.o., adult with a past medical history of Asthma, migraine headaches . He has followed with [facility] since 08/12/2018 for consultative assistance with management of his gender dysphoria and hyperthyroidism  HISTORICAL SUMMARY: The patient was first noted with gender dysphoria at age 36. ( Male to Male ) . Puberty was at age 89.    S/P musculizing breast sx in 2017  Gender affirming treatment started in 2015  No hx of DVT, dyslipidemia, liver or renal disease, OSA , CAD nor CVA.  S/P hysterectomy 11/2021  HYPERTHYROID HISTORY: Pt has been diagnosed with hyperthyroidism in 07/2018 NO prior XRT exposure nor neck sx  No prior amiodarone use    Methimazole started 08/2018 Pt had an uptake and scan in 05/2019 with an elevated  24-hr uptake of 75% of I-131 S/P RAI ablation on 06/16/2019 with 12.3 mCI of I-131 Pt was started on LT-4 replacement by 09/2019 No FH of thyroid disease   Pt was seen by Dr. Everardo All 10/19 until 06/2021   SUBJECTIVE:   Today (02/08/2024):  Anthony Rowe Rowe is here for a follow up on gender dysphoria and postablative hypothyroidism   Patient is accompanied by his 38-year-old child who has a diagnosis of autism  Pt follows with plastic sx for pending phalloplasty, this has been postponed due to weight gain  Last testosterone injection was today  Denies rash  Denies acne   Denies local neck swelling  Patient continues to follow-up with the weight loss clinic, intolerant to [facility], he was restarted on Ozempic   HOME ENDOCRINE MEDICATIONS: Levothyroxine 137 mcg, 2 tabs daily  Testosterone cypionate 50 mg every 14 days        HISTORY:  Past Medical  History:  Past Medical History:  Diagnosis Date   Asthma    Bronchitis    COVID-19    Endometriosis    Hypertension    Migraine    Thyroid disease    hyperthyroidism per pt report   Past Surgical History:  Past Surgical History:  Procedure Laterality Date   ABDOMINAL HYSTERECTOMY     ANKLE SURGERY     MASTECTOMY Bilateral 2017   male to male    Social History:  reports that he has never smoked. He has never used smokeless tobacco. He reports that he does not drink alcohol and does not use drugs. Family History:  Family History  Problem Relation Age of Onset   Cancer Mother        lung   Hypertension Maternal Grandmother    Diabetes Maternal Grandmother    Thyroid disease Neg Hx      HOME MEDICATIONS: Allergies as of 02/08/2024       Reactions   Frovatriptan Other (See Comments)   Numbness in both legs   Dilaudid [hydromorphone Hcl]    Pt says medication make his legs go numb   Sulfa Antibiotics Other (See Comments)   Makes patients legs numb         Medication List        Accurate as of February 08, 2024  7:24 AM. If you have any questions, ask your nurse or doctor.  albuterol 108 (90 Base) MCG/ACT inhaler Commonly known as: VENTOLIN HFA Inhale 1-2 puffs into the lungs every 6 (six) hours as needed for wheezing or shortness of breath.   buPROPion 150 MG 24 hr tablet Commonly known as: WELLBUTRIN XL Take 150 mg by mouth daily.   busPIRone 10 MG tablet Commonly known as: BUSPAR Take 10 mg by mouth 2 (two) times daily.   cyclobenzaprine 10 MG tablet Commonly known as: FLEXERIL Take 1 tablet (10 mg total) by mouth 2 (two) times daily as needed for muscle spasms.   Flovent HFA 220 MCG/ACT inhaler Generic drug: fluticasone Inhale 2 puffs into the lungs twice daily   fluticasone 50 MCG/ACT nasal spray Commonly known as: FLONASE Place 1 spray into both nostrils daily. What changed:  when to take this reasons to take this   hydrOXYzine 25  MG tablet Commonly known as: ATARAX Take 1-2 tablets (25-50 mg total) by mouth at bedtime as needed. What changed: reasons to take this   levocetirizine 5 MG tablet Commonly known as: XYZAL Take 1 tablet (5 mg total) by mouth 2 (two) times daily. What changed:  when to take this reasons to take this   levothyroxine 175 MCG tablet Commonly known as: SYNTHROID Take 1 tablet (175 mcg total) by mouth daily.   lidocaine 4 % Commonly known as: HM Lidocaine Patch Apply 1 patch topically every 12 (twelve) hours as needed.   loperamide 2 MG capsule Commonly known as: IMODIUM Take 1 capsule (2 mg total) by mouth daily as needed for diarrhea or loose stools.   metoprolol succinate 25 MG 24 hr tablet Commonly known as: TOPROL-XL Take 1 tablet (25 mg total) by mouth daily. Take with or immediately following a meal. What changed: how much to take   mirtazapine 15 MG tablet Commonly known as: REMERON Take 15 mg by mouth at bedtime.   montelukast 10 MG tablet Commonly known as: SINGULAIR Take 1 tablet (10 mg total) by mouth at bedtime.   Mounjaro 2.5 MG/0.5ML Pen Generic drug: tirzepatide Inject 2.5 mg into the skin once a week.   naproxen 375 MG tablet Commonly known as: NAPROSYN Take 1 tablet (375 mg total) by mouth 2 (two) times daily as needed (muscle pain).   nystatin cream Commonly known as: MYCOSTATIN Apply to affected area 2 times daily   ondansetron 8 MG disintegrating tablet Commonly known as: ZOFRAN-ODT Take 1 tablet (8 mg total) by mouth every 8 (eight) hours as needed for nausea or vomiting.   Oxycodone HCl 10 MG Tabs Take 15 mg by mouth 4 (four) times daily as needed.   Syringe/Needle (Disp) 22G X 1-1/2" 1 ML Misc 1 Device by Does not apply route every 14 (fourteen) days.   Testosterone Cypionate 200 MG/ML Soln Inject 100 mg as directed every 14 (fourteen) days.   traZODone 100 MG tablet Commonly known as: DESYREL Take 100 mg by mouth at bedtime as needed  for sleep.   triamcinolone cream 0.1 % Commonly known as: KENALOG Apply 1 application topically 4 (four) times daily. As needed for itching What changed:  when to take this reasons to take this additional instructions          OBJECTIVE:   PHYSICAL EXAM: VS: There were no vitals taken for this visit.   EXAM: General: Pt appears well and is in NAD  Neck: General: Supple without adenopathy. Thyroid: Thyroid size normal.  No goiter or nodules appreciated.  Lungs: Clear with good BS bilat with no  rales, rhonchi, or wheezes  Heart: Auscultation: RRR.  Abdomen: Normoactive bowel sounds, soft, nontender, without masses or organomegaly palpable  Extremities:  BL LE: No pretibial edema normal ROM and strength.  Mental Status: Judgment, insight: Intact Orientation: Oriented to time, place, and person Mood and affect: No depression, anxiety, or agitation     DATA REVIEWED:  12/27/2023 WBC 3.1 H/H 14.3/42.3 Na 143 K 4.7 BUN 15 Cr. 0.87 GFR > 90    Old records , labs and images have been reviewed.   ASSESSMENT / PLAN / RECOMMENDATIONS:   Gender Dysphoria   - F- M -Status post mastectomy and hysterectomy.  Patient is following up with [facility] for weight loss and preparation for phalloplasty -Last dose of testosterone was today, patient will return on 8/27 for repeat testing -No changes at this time  Medications  Continue testosterone 50 mg q. 14 days IM  2. Postablative Hypothyroidism:  -S/P RAI ablation in 2020 -His last TSH was extremely elevated at 60 uIU/mL, I have doubled up on his levothyroxine as below -Patient will return for repeat labs 8/27 -Patient ensures me compliance, and having a pillbox -- Pt educated extensively on the correct way to take levothyroxine (first thing in the morning with water, 30 minutes before eating or taking other medications). - Pt encouraged to double dose the following day if she were to miss a dose given long half-life of  levothyroxine.     Medication Continue levothyroxine 137 mcg , 2 tabs  daily      Follow-up in 6 months   Signed electronically by: Lyndle Herrlich, MD  [facility]  [facility] [address] [place] [place], [place] [address] Phone: [phone] FAX: [phone]      CC: Fleet Contras, MD [address] [place] [address] Phone: [phone]  Fax: [phone]   Return to Endocrinology clinic as below: Future Appointments  Date Time Provider Department Center  02/08/2024  1:20 PM Assata Juncaj, Konrad Dolores, MD [facility] None

## 2024-02-09 ENCOUNTER — Encounter: Payer: Self-pay | Admitting: Internal Medicine

## 2024-02-09 ENCOUNTER — Ambulatory Visit (INDEPENDENT_AMBULATORY_CARE_PROVIDER_SITE_OTHER): Admitting: Internal Medicine

## 2024-02-09 VITALS — BP 120/76 | HR 89 | Ht 64.0 in | Wt 142.0 lb

## 2024-02-09 DIAGNOSIS — F649 Gender identity disorder, unspecified: Secondary | ICD-10-CM

## 2024-02-09 DIAGNOSIS — E890 Postprocedural hypothyroidism: Secondary | ICD-10-CM | POA: Diagnosis not present

## 2024-02-09 NOTE — Progress Notes (Unsigned)
 Name: Anthony Rowe  MRN/ DOB: [id number], [birth year]    Age/ Sex: 43 y.o., adult     PCP: Fleet Contras, MD   Reason for Endocrinology Evaluation: Gender dysphoria /hyperthyroidism     Initial Endocrinology Clinic Visit: 08/12/2018    PATIENT IDENTIFIER: Anthony Rowe is a 43 y.o., adult with a past medical history of Asthma, migraine headaches . He has followed with [facility] since 08/12/2018 for consultative assistance with management of his gender dysphoria and hyperthyroidism  HISTORICAL SUMMARY: The patient was first noted with gender dysphoria at age 39. ( Male to Male ) . Puberty was at age 52.    S/P musculizing breast sx in 2017  Gender affirming treatment started in 2015  No hx of DVT, dyslipidemia, liver or renal disease, OSA , CAD nor CVA.  S/P hysterectomy 11/2021  HYPERTHYROID HISTORY: Pt has been diagnosed with hyperthyroidism in 07/2018 NO prior XRT exposure nor neck sx  No prior amiodarone use    Methimazole started 08/2018 Pt had an uptake and scan in 05/2019 with an elevated  24-hr uptake of 75% of I-131 S/P RAI ablation on 06/16/2019 with 12.3 mCI of I-131 Pt was started on LT-4 replacement by 09/2019 No FH of thyroid disease   Pt was seen by Dr. Everardo All 10/19 until 06/2021   SUBJECTIVE:   Today (02/09/2024):  Anthony Rowe is here for a follow up on gender dysphoria and postablative hypothyroidism    Patient has been noted with weight loss Scheduled for phalloplasty tomorrow  He injured his back at the gym, had an ED visit yesterday, Flexeril and oxycodone  Last testosterone injection was 2 weeks ago ( Friday)  Denies rash  Denies acne   Denies local neck swelling  Denies palpitations  Denies constipation or diarrhea  Patient continues to follow-up with the weight loss clinic, intolerant to [facility], he was restarted on Ozempic   HOME ENDOCRINE MEDICATIONS: Levothyroxine 137 mcg, 2 tabs daily  Testosterone cypionate 100  mg every 14 days      HISTORY:  Past Medical History:  Past Medical History:  Diagnosis Date   Asthma    Bronchitis    COVID-19    Endometriosis    Hypertension    Migraine    Thyroid disease    hyperthyroidism per pt report   Past Surgical History:  Past Surgical History:  Procedure Laterality Date   ABDOMINAL HYSTERECTOMY     ANKLE SURGERY     MASTECTOMY Bilateral 2017   male to male    Social History:  reports that he has never smoked. He has never used smokeless tobacco. He reports that he does not drink alcohol and does not use drugs. Family History:  Family History  Problem Relation Age of Onset   Cancer Mother        lung   Hypertension Maternal Grandmother    Diabetes Maternal Grandmother    Thyroid disease Neg Hx      HOME MEDICATIONS: Allergies as of 02/09/2024       Reactions   Frovatriptan Other (See Comments)   Numbness in both legs   Sulfa Antibiotics Other (See Comments), Itching   Makes patients legs numb   Dilaudid [hydromorphone Hcl]    Pt says medication make his legs go numb   Thiazide-type Diuretics Other (See Comments)        Medication List        Accurate as of February 09, 2024  9:54 AM. If you  have any questions, ask your nurse or doctor.          albuterol 108 (90 Base) MCG/ACT inhaler Commonly known as: VENTOLIN HFA Inhale 1-2 puffs into the lungs every 6 (six) hours as needed for wheezing or shortness of breath.   buPROPion 150 MG 24 hr tablet Commonly known as: WELLBUTRIN XL Take 150 mg by mouth daily.   busPIRone 10 MG tablet Commonly known as: BUSPAR Take 10 mg by mouth 2 (two) times daily.   cetirizine 10 MG tablet Commonly known as: ZYRTEC SMARTSIG:1 Tablet(s) By Mouth Every Evening PRN   cyclobenzaprine 10 MG tablet Commonly known as: FLEXERIL Take 1 tablet (10 mg total) by mouth 2 (two) times daily as needed for muscle spasms.   Flovent HFA 220 MCG/ACT inhaler Generic drug: fluticasone Inhale 2 puffs  into the lungs twice daily   fluticasone 50 MCG/ACT nasal spray Commonly known as: FLONASE Place 1 spray into both nostrils daily. What changed:  when to take this reasons to take this   hydrOXYzine 25 MG tablet Commonly known as: ATARAX Take 1-2 tablets (25-50 mg total) by mouth at bedtime as needed. What changed: reasons to take this   levocetirizine 5 MG tablet Commonly known as: XYZAL Take 1 tablet (5 mg total) by mouth 2 (two) times daily. What changed:  when to take this reasons to take this   levothyroxine 137 MCG tablet Commonly known as: SYNTHROID Take 2 tablets by mouth daily before breakfast.   levothyroxine 175 MCG tablet Commonly known as: SYNTHROID Take 1 tablet (175 mcg total) by mouth daily.   lidocaine 4 % Commonly known as: HM Lidocaine Patch Apply 1 patch topically every 12 (twelve) hours as needed.   loperamide 2 MG capsule Commonly known as: IMODIUM Take 1 capsule (2 mg total) by mouth daily as needed for diarrhea or loose stools.   metoprolol succinate 25 MG 24 hr tablet Commonly known as: TOPROL-XL Take 1 tablet (25 mg total) by mouth daily. Take with or immediately following a meal.   metoprolol succinate 50 MG 24 hr tablet Commonly known as: TOPROL-XL Take 1 tablet by mouth daily.   mirtazapine 15 MG tablet Commonly known as: REMERON Take 15 mg by mouth at bedtime.   montelukast 10 MG tablet Commonly known as: SINGULAIR Take 1 tablet (10 mg total) by mouth at bedtime.   Mounjaro 12.5 MG/0.5ML Pen Generic drug: tirzepatide SMARTSIG:12.5 Milligram(s) SUB-Q Once a Week   Mounjaro 2.5 MG/0.5ML Pen Generic drug: tirzepatide Inject 2.5 mg into the skin once a week.   naproxen 375 MG tablet Commonly known as: NAPROSYN Take 1 tablet (375 mg total) by mouth 2 (two) times daily as needed (muscle pain).   Norel AD 4-10-325 MG Tabs Generic drug: Chlorphen-PE-Acetaminophen Take 1 tablet by mouth 2 (two) times daily as needed.   nystatin  cream Commonly known as: MYCOSTATIN Apply to affected area 2 times daily   ondansetron 4 MG tablet Commonly known as: ZOFRAN Take by mouth.   ondansetron 8 MG disintegrating tablet Commonly known as: ZOFRAN-ODT Take 1 tablet (8 mg total) by mouth every 8 (eight) hours as needed for nausea or vomiting.   oxyCODONE 15 MG immediate release tablet Commonly known as: ROXICODONE 1 (ONE) TABLET FOUR TIMES DAILY AS NEEDED FOR PAIN   Oxycodone HCl 10 MG Tabs Take 15 mg by mouth 4 (four) times daily as needed.   Syringe/Needle (Disp) 22G X 1-1/2" 1 ML Misc 1 Device by Does not apply  route every 14 (fourteen) days.   Testosterone Cypionate 200 MG/ML Soln Inject 100 mg as directed every 14 (fourteen) days.   traZODone 50 MG tablet Commonly known as: DESYREL Take by mouth.   traZODone 100 MG tablet Commonly known as: DESYREL Take 100 mg by mouth at bedtime as needed for sleep.   triamcinolone cream 0.1 % Commonly known as: KENALOG Apply 1 application topically 4 (four) times daily. As needed for itching What changed:  when to take this reasons to take this additional instructions          OBJECTIVE:   PHYSICAL EXAM: VS: BP 120/76 (BP Location: Left Arm, Patient Position: Sitting, Cuff Size: Small)   Pulse 89   Ht 5\' 4"  (1.626 m)   Wt 142 lb (64.4 kg)   SpO2 99%   BMI 24.37 kg/m    EXAM: General: Pt appears well and is in NAD  Neck: General: Supple without adenopathy. Thyroid: Thyroid size normal.  No goiter or nodules appreciated.  Lungs: Clear with good BS bilat  Heart: Auscultation: RRR.  Extremities:  BL LE: No pretibial edema   Mental Status: Judgment, insight: Intact Orientation: Oriented to time, place, and person Mood and affect: No depression, anxiety, or agitation    DATA REVIEWED:  12/27/2023 WBC 3.1 H/H 14.3/42.3 Na 143 K 4.7 BUN 15 Cr. 0.87 GFR > 90    Old records , labs and images have been reviewed.   ASSESSMENT / PLAN /  RECOMMENDATIONS:   Gender Dysphoria   - F- M -S/P  mastectomy and hysterectomy.  - Scheduled for phalloplasty tomorrow  -Last dose of testosterone was 2 weeks ago, will recheck in 3 months, ,explained to the pt the importance of checking testosterone 1 week after the inection - NO change at this time   Medications  Continue testosterone 100 mg q. 14 days IM  2. Postablative Hypothyroidism:  -S/P RAI ablation in 2020 -TSH continues to be suppressed, will decrease levothyroxine as below 61 patient is following so I will myself I just hope is patient explained my husband is like have a great have a great visit thing like myself this block I had a phone time I am only like water because I do not need it altogether we will see what    Medication Stop  levothyroxine 137 mcg , 2 tabs  daily Start Levothyroxine 200 mcg daily       Follow-up in 6 months Labs in 3 months  Signed electronically by: Lyndle Herrlich, MD  [facility]  [facility] [address] [place] [place], [place] [address] Phone: [phone] FAX: [phone]      CC: Fleet Contras, MD [address] [place] [address] Phone: [phone]  Fax: [phone]   Return to Endocrinology clinic as below: No future appointments.

## 2024-02-09 NOTE — Patient Instructions (Signed)
 Please call us in 3 months to schedule a lab appointment for testosterone check. The test is to be done one week after the testosterone injection

## 2024-02-10 ENCOUNTER — Encounter: Payer: Self-pay | Admitting: Internal Medicine

## 2024-02-10 LAB — T4, FREE: Free T4: 3.5 ng/dL — ABNORMAL HIGH (ref 0.8–1.8)

## 2024-02-10 LAB — TSH: TSH: <0.01 m[IU]/L — ABNORMAL LOW (ref 0.40–4.50)

## 2024-02-10 MED ORDER — LEVOTHYROXINE SODIUM 200 MCG PO TABS: 200.0000 ug | ORAL_TABLET | Freq: Every day | ORAL | 3 refills | Status: DC

## 2024-03-17 ENCOUNTER — Telehealth: Payer: Self-pay

## 2024-03-17 ENCOUNTER — Other Ambulatory Visit: Payer: Self-pay

## 2024-03-17 NOTE — Telephone Encounter (Signed)
 Patient call requesting refill on his Testosterone  injection.

## 2024-03-20 ENCOUNTER — Other Ambulatory Visit: Payer: Self-pay | Admitting: Internal Medicine

## 2024-03-20 MED ORDER — TESTOSTERONE CYPIONATE 200 MG/ML IJ SOLN: 100.0000 mg | INTRAMUSCULAR | 5 refills | Status: DC

## 2024-03-21 ENCOUNTER — Telehealth: Payer: Self-pay

## 2024-03-21 ENCOUNTER — Other Ambulatory Visit (HOSPITAL_COMMUNITY): Payer: Self-pay

## 2024-03-21 NOTE — Telephone Encounter (Signed)
 error

## 2024-03-22 ENCOUNTER — Other Ambulatory Visit: Payer: Self-pay

## 2024-03-22 ENCOUNTER — Telehealth: Payer: Self-pay

## 2024-03-22 ENCOUNTER — Other Ambulatory Visit (HOSPITAL_COMMUNITY): Payer: Self-pay

## 2024-03-22 NOTE — Telephone Encounter (Signed)
 Testosterone needs PA

## 2024-03-22 NOTE — Telephone Encounter (Signed)
 Pharmacy Patient Advocate Encounter  Received notification from [employer] that Prior Authorization for Testosterone  inj has been DENIED.  See denial reason below. No denial letter attached in CMM. Will attach denial letter to Media tab once received.   PA #/Case ID/Reference #: [id number]   You asked for the drug above for your Gender identity disorder, unspecified. This is an off-label use that is not medically accepted. The Medicare rule in the Prescription Drug Benefit Manual (Chapter 6, Section 10.6) says a drug must be used for a medically accepted indication (covered use). Off-label use is medically accepted when there is proof in one or more of the drug guides that the drug works for your condition. We look at the two major drug guides (compendia): the DRUGDEX Information System and the [facility] Formulary Service Drug Information (AHFS-DI). [employer] has decided that there is no proof in either drug guide that this drug works for your condition. Per Medicare rules, it is not covered

## 2024-03-22 NOTE — Telephone Encounter (Signed)
 Pharmacy Patient Advocate Encounter   Received notification from Pt Calls Messages that prior authorization for Testosterone  inj is required/requested.   Insurance verification completed.   The patient is insured through [employer] .   Per test claim: PA required; PA submitted to above mentioned insurance via CoverMyMeds Key/confirmation #/EOC [id number] Status is pending   Please be advised, [employer] has his name listed as "Anthony Rowe".

## 2024-03-31 ENCOUNTER — Telehealth: Payer: Self-pay

## 2024-03-31 NOTE — Telephone Encounter (Signed)
 Patient aware and will contact insurance to see what has changed as far as coverage.

## 2024-03-31 NOTE — Telephone Encounter (Signed)
 Patient calling regarding the Testosterone . It looks like PA team ran it and it't not covered.

## 2024-04-03 ENCOUNTER — Telehealth: Payer: Self-pay

## 2024-04-03 NOTE — Telephone Encounter (Signed)
 Patient states that insurance is sending over paperwork to be completed because correct codes was not put in for the PA. That should come over today

## 2024-04-05 NOTE — Telephone Encounter (Signed)
 Pay will reach back out to insurance

## 2024-04-21 ENCOUNTER — Emergency Department (HOSPITAL_COMMUNITY)
Admission: EM | Admit: 2024-04-21 | Discharge: 2024-04-22 | Disposition: A | Discharge status: Home / Self Care | Attending: Emergency Medicine | Admitting: Emergency Medicine

## 2024-04-21 ENCOUNTER — Emergency Department (HOSPITAL_COMMUNITY)

## 2024-04-21 DIAGNOSIS — M79632 Pain in left forearm: Secondary | ICD-10-CM | POA: Diagnosis present

## 2024-04-21 DIAGNOSIS — M79602 Pain in left arm: Secondary | ICD-10-CM

## 2024-04-21 NOTE — ED Triage Notes (Signed)
 Patient had a left forearm surgery at [facility] last April 10,2025 . Patient reports persistent swelling at left forearm after surgery , denies recent injury , no fever or chills .

## 2024-04-22 NOTE — Discharge Instructions (Signed)
Follow up with your care team as planned.

## 2024-04-22 NOTE — ED Provider Notes (Signed)
 [place] EMERGENCY DEPARTMENT AT [facility] Provider Note   CSN: [id number] Arrival date & time: 04/21/24  2237     Patient presents with: Left Forearm Swelling   Anthony Rowe is a 43 y.o. adult.   43 y.o. transgender male that presents with left arm pain and tingling in hand that started 4 days ago. He reports he had a left volar and dorsal surface graft for Phalloplasty at [facility] on 02/10/24. States he has felt numbness, pain and now tingling in is left mid-forearm to his hand since surgery. He is now experiencing tingling in his finger tips and feels cold at times. States hand has been more swollen then at today ER visit. He has tried Oxycodone , alternating hot and cold compress, and arm elevation with no improvement. States movement makes the feeling worse. Denies fever, chills, chest pain or shortness of breath. Denies any trama or injury to left arm post surgery. He has an appointment with his PCP this coming Tuesday and with his [facility] team on Monday.       Prior to Admission medications   Medication Sig Start Date End Date Taking? Authorizing Provider  albuterol  (VENTOLIN  HFA) 108 (90 Base) MCG/ACT inhaler Inhale 1-2 puffs into the lungs every 6 (six) hours as needed for wheezing or shortness of breath. 12/09/22   Enedelia Dorna HERO, FNP  buPROPion  (WELLBUTRIN  XL) 150 MG 24 hr tablet Take 150 mg by mouth daily. Patient not taking: Reported on 02/09/2024 12/05/19   [provider]  busPIRone  (BUSPAR ) 10 MG tablet Take 10 mg by mouth 2 (two) times daily. Patient not taking: Reported on 02/09/2024 11/14/19   [provider]  cetirizine (ZYRTEC) 10 MG tablet SMARTSIG:1 Tablet(s) By Mouth Every Evening PRN 02/04/24   [provider]  cyclobenzaprine  (FLEXERIL ) 10 MG tablet Take 1 tablet (10 mg total) by mouth 2 (two) times daily as needed for muscle spasms. Patient not taking: Reported on 02/09/2024 06/30/23   Mayer, Jodi R, NP  fluticasone   (FLONASE ) 50 MCG/ACT nasal spray Place 1 spray into both nostrils daily. Patient taking differently: Place 1 spray into both nostrils daily as needed for allergies. 07/14/19   Burky, Natalie B, NP  fluticasone  (FLOVENT  HFA) 220 MCG/ACT inhaler Inhale 2 puffs into the lungs twice daily 02/09/20   Jeneal Danita Macintosh, MD  hydrOXYzine  (ATARAX /VISTARIL ) 25 MG tablet Take 1-2 tablets (25-50 mg total) by mouth at bedtime as needed. Patient taking differently: Take 25-50 mg by mouth at bedtime as needed for anxiety or vomiting. 07/26/19   Jeneal Danita Macintosh, MD  levocetirizine (XYZAL ) 5 MG tablet Take 1 tablet (5 mg total) by mouth 2 (two) times daily. Patient taking differently: Take 5 mg by mouth daily as needed for allergies. 03/30/19   Jeneal Danita Macintosh, MD  levothyroxine  (SYNTHROID ) 200 MCG tablet Take 1 tablet (200 mcg total) by mouth daily. 02/10/24   Shamleffer, Ibtehal Jaralla, MD  Lidocaine  (HM LIDOCAINE  PATCH) 4 % PTCH Apply 1 patch topically every 12 (twelve) hours as needed. 12/17/21   Eudelia Maude SAUNDERS, PA-C  loperamide  (IMODIUM ) 2 MG capsule Take 1 capsule (2 mg total) by mouth daily as needed for diarrhea or loose stools. 07/18/19   Christopher Savannah, PA-C  metoprolol  succinate (TOPROL -XL) 25 MG 24 hr tablet Take 1 tablet (25 mg total) by mouth daily. Take with or immediately following a meal. Patient not taking: Reported on 02/09/2024 05/19/20   Nivia Colon, PA-C  metoprolol  succinate (TOPROL -XL) 50 MG 24 hr  tablet Take 1 tablet by mouth daily. 02/04/24   [provider]  mirtazapine (REMERON) 15 MG tablet Take 15 mg by mouth at bedtime. 05/28/23   [provider]  montelukast  (SINGULAIR ) 10 MG tablet Take 1 tablet (10 mg total) by mouth at bedtime. 07/26/19   Jeneal Danita Macintosh, MD  MOUNJARO 12.5 MG/0.5ML Pen SMARTSIG:12.5 Milligram(s) SUB-Q Once a Week Patient not taking: Reported on 02/09/2024    [provider]  MOUNJARO 2.5 MG/0.5ML Pen Inject 2.5 mg  into the skin once a week. Patient not taking: Reported on 02/09/2024 05/24/23   [provider]  naproxen  (NAPROSYN ) 375 MG tablet Take 1 tablet (375 mg total) by mouth 2 (two) times daily as needed (muscle pain). 06/30/23   Mayer, Jodi R, NP  NOREL AD 4-10-325 MG TABS Take 1 tablet by mouth 2 (two) times daily as needed. Patient not taking: Reported on 02/09/2024 02/04/24   [provider]  nystatin  cream (MYCOSTATIN ) Apply to affected area 2 times daily 12/12/22   Bernis Ernst, PA-C  ondansetron  (ZOFRAN ) 4 MG tablet Take by mouth. Patient not taking: Reported on 02/09/2024    [provider]  ondansetron  (ZOFRAN -ODT) 8 MG disintegrating tablet Take 1 tablet (8 mg total) by mouth every 8 (eight) hours as needed for nausea or vomiting. 07/18/19   Christopher Savannah, PA-C  oxyCODONE  (ROXICODONE ) 15 MG immediate release tablet 1 (ONE) TABLET FOUR TIMES DAILY AS NEEDED FOR PAIN 06/13/20   [provider]  Oxycodone  HCl 10 MG TABS Take 15 mg by mouth 4 (four) times daily as needed. Patient not taking: Reported on 02/09/2024 02/15/23   [provider]  Syringe/Needle, Disp, 22G X 1-1/2 1 ML MISC 1 Device by Does not apply route every 14 (fourteen) days. 07/10/22   Shamleffer, Ibtehal Jaralla, MD  Testosterone  Cypionate 200 MG/ML SOLN Inject 100 mg as directed every 14 (fourteen) days. 03/20/24   Shamleffer, Ibtehal Jaralla, MD  traZODone  (DESYREL ) 100 MG tablet Take 100 mg by mouth at bedtime as needed for sleep.  Patient not taking: Reported on 02/09/2024    [provider]  traZODone  (DESYREL ) 50 MG tablet Take by mouth. Patient not taking: Reported on 02/09/2024 10/13/23   [provider]  triamcinolone  cream (KENALOG ) 0.1 % Apply 1 application topically 4 (four) times daily. As needed for itching Patient taking differently: Apply 1 application  topically 3 (three) times daily as needed (for rash). 07/26/19   Kassie Mallick, MD    Allergies: Frovatriptan, Sulfa  antibiotics, Dilaudid  [hydromorphone  hcl], and Thiazide-type diuretics    Review of Systems Negative except as per HPI Updated Vital Signs BP (!) 165/120   Pulse 84   Temp 98.1 F (36.7 C)   Resp 20   SpO2 100%   Physical Exam Vitals and nursing note reviewed.  Constitutional:      General: He is not in acute distress.    Appearance: He is well-developed. He is not diaphoretic.  HENT:     Head: Normocephalic and atraumatic.   Cardiovascular:     Pulses: Normal pulses.  Pulmonary:     Effort: Pulmonary effort is normal.   Musculoskeletal:     Comments: Left lower arm as graft site that appears as scar tissue on volar surface from near mid-forearm to wrist. NEG for infection, rash or discharge. Sensation to touch is decreased on volar surface left mid-forearm to hand. Cap refill normal, pulses are normal. Left hand appears mildly swollen compared to right, no  pitting noted. Left hand ROM is slightly limited with all movement and strength. Full ROM with left elbow.   Skin:    General: Skin is warm and dry.     Findings: No erythema.   Neurological:     Mental Status: He is alert and oriented to person, place, and time.   Psychiatric:        Behavior: Behavior normal.     (all labs ordered are listed, but only abnormal results are displayed) Labs Reviewed - No data to display  EKG: None  Radiology: DG Forearm Left Result Date: 04/21/2024 CLINICAL DATA:  Right forearm swelling EXAM: LEFT FOREARM - 2 VIEW COMPARISON:  None Available. FINDINGS: There is no evidence of fracture or other focal bone lesions. The soft tissues of the left form appear attenuated and there are innumerable surgical clips present suggesting possible tissue grafting. The soft tissues are otherwise unremarkable. IMPRESSION: 1. No acute fracture or dislocation. Electronically Signed   By: Dorethia Molt M.D.   On: 04/21/2024 23:59     Procedures   Medications Ordered in the ED - No data to  display                                  Medical Decision Making Amount and/or Complexity of Data Reviewed Radiology: ordered.   43 year old male with complaint of tingling in the left hand. Patient with complex post op course following skin graft to the forearm. Patient is scheduled to see his care team on Monday and PCP on Tuesday with plan to discuss EMG testing. Here tonight due to tingling sensation in the hand. Cap refill symmetric, radial pulses present, reports pressure and tingling to the left fingers. Unable to obtain EMG testing in the ER. Patient will be seen by his team in the near future. Recommend following up as planned. No evidence of compartment syndrome here tonight. XR without acute findings (staples in place), agree with radiology interpretation.      Final diagnoses:  Pain of left upper extremity    ED Discharge Orders     None          Dalayza Zambrana A, PA-C 04/22/24 0519    Palumbo, April, MD 04/22/24 [phone]

## 2024-04-24 ENCOUNTER — Telehealth: Payer: Self-pay

## 2024-04-24 NOTE — Telephone Encounter (Signed)
 Patient left vm stating that a urgent request was sent over from insurance for the Testosterone . This needs to be completed asap so they can approve a supply for the medication.  They will be changing patient insurance as of 05/02/24.

## 2024-04-24 NOTE — Telephone Encounter (Signed)
Attempted to contact patient no answer and vm is full

## 2024-04-25 NOTE — Telephone Encounter (Signed)
 Patient will callback on 05/02/24 with the new insurance information

## 2024-05-09 ENCOUNTER — Other Ambulatory Visit (HOSPITAL_COMMUNITY): Payer: Self-pay

## 2024-05-09 NOTE — Telephone Encounter (Signed)
 Pharmacy Patient Advocate Encounter  Received notification from [employer] that Prior Authorization for Testosterone  Cyp 200mg /ml has been APPROVED from 05/08/24 to 11/01/24 Last filled 04/28/24 (per test claim)   PA #/Case ID/Reference #: [id number]

## 2024-05-16 NOTE — Progress Notes (Addendum)
 Plastic Surgery Clinic Note  HPI: Anthony Rowe is a 43 y.o. adult who presents for post op check. They have a history of history of hypertension, asthma, and hypothyroidism. He is now s/p  RFFF phalloplasty on 02/10/23, now 34mo postop He developed stiffness in the forearm and was referred to OT. He did not set up his OT appointment with the [facility] team, but wished to obtain second opinions first and was trying to get his medical records to the other hand practice  He eventually commenced OT at a location close to his residence in the past 2 weeks and states he has seen significant improvement.  He reports his forearm is still weak and stiff and he has occasional pain in his fingers.   No numbness or weakness.   No new problems with his neophallus  Physical Examination:   Visit Vitals BP 122/79 (BP Site: R Arm, BP Position: Sitting, BP Cuff Size: Medium)  Pulse 53  Temp 36.5 C (97.7 F) (Temporal)  Ht 160 cm (5' 3)  Wt 73.5 kg (162 lb)  SpO2 100%  BMI 28.70 kg/m   Gen: Alert and oriented. no acute distress, resting comfortably Cardiovascular: Normal rate  Pulmonary: Normal work of breathing on room air.  Extremities: Warm and well perfused.  Right Forearm: well-healed graft. 100% take. Scars well-healed. Hand and wrist stiffness much improved with near-full flexion and full extension of fingers. He can make a full fist Sensation intact to all fingers and Power is 4+ ROM intact at wrist as and elbows  GU: Well-healed scars of neophallus.   Right thigh: donor site healed   Assessment:  Anthony Rowe is a 43 y.o. adult presents status post RFFF phalloplasty on 02/10/23.    He is here with his cousin today and is unhappy about his postoperative stiffness. I informed the patient they were placed in a splint initially so that the graft would heal. Therapy should've been done as soon as the graft was healed. I discussed if therapy was done before the graft was healed this would also  cause much more recalcitrant stiffness due to scarring in the forearm muscles.  I discussed the importance of being seen by providers who have dealt with cases similar to the patient's so they can adequately be treated given that phalloplasty not a commonly-performed procedure. He was offered a second opinion with phalloplasty surgeons.   I discussed the importance of silicone sheets to prevent excessive scarring and keloids and and flatten the edges his scars. Recommend daily application of the silicone sheet along the seams of his graft scar.  The patient was asked how they would like to proceed because he expresses frustration. Blameless apology offered for his postoperative problems and we understand his frustration however prior to the procedure I explained the risks and complications that came with the surgery. My expectation is that after the therapy course is completed he will achieve full hand function.   Plan:  - Scar treatment with daily application silicone sheets - Continue hand therapy for 4-6 weeks - Lotion to thigh BID - Follow up in 4-6 weeks

## 2024-05-20 ENCOUNTER — Ambulatory Visit
Admission: EM | Admit: 2024-05-20 | Discharge: 2024-05-20 | Disposition: A | Discharge status: Home / Self Care | Attending: Family Medicine | Admitting: Family Medicine

## 2024-05-20 DIAGNOSIS — S39012A Strain of muscle, fascia and tendon of lower back, initial encounter: Secondary | ICD-10-CM | POA: Diagnosis not present

## 2024-05-20 DIAGNOSIS — M546 Pain in thoracic spine: Secondary | ICD-10-CM

## 2024-05-20 DIAGNOSIS — M542 Cervicalgia: Secondary | ICD-10-CM

## 2024-05-20 DIAGNOSIS — M5441 Lumbago with sciatica, right side: Secondary | ICD-10-CM

## 2024-05-20 MED ORDER — PREDNISONE 20 MG PO TABS: 20.0000 mg | ORAL_TABLET | Freq: Two times a day (BID) | ORAL | 0 refills | Status: DC

## 2024-05-20 MED ORDER — METHOCARBAMOL 500 MG PO TABS
500.0000 mg | ORAL_TABLET | Freq: Two times a day (BID) | ORAL | 0 refills | Status: DC
Start: 2024-05-20 — End: 2024-06-12
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
## 2024-06-06 NOTE — Telephone Encounter (Signed)
 Prior Auth Approval via [employer] (Key: [id number]) Zepbound 2.5MG /0.5ML pen-injectors

## 2024-06-06 NOTE — Telephone Encounter (Signed)
 PA Completed via CoverMyMeds  Mendeec Hagy (Key: [id number]) Zepbound 2.5MG /0.5ML pen-injectors

## 2024-06-07 ENCOUNTER — Inpatient Hospital Stay (HOSPITAL_COMMUNITY)
Admission: AD | Admit: 2024-06-07 | Discharge: 2024-06-12 | DRG: 885 | Disposition: A | Discharge status: Home / Self Care | Source: Intra-hospital | Attending: Psychiatry | Admitting: Psychiatry

## 2024-06-07 ENCOUNTER — Other Ambulatory Visit (INDEPENDENT_AMBULATORY_CARE_PROVIDER_SITE_OTHER)
Admission: EM | Admit: 2024-06-07 | Discharge: 2024-06-07 | Disposition: A | Discharge status: Other Acute Inpatient Hospital | Source: Home / Self Care | Attending: Psychiatry | Admitting: Psychiatry

## 2024-06-07 DIAGNOSIS — F411 Generalized anxiety disorder: Secondary | ICD-10-CM | POA: Diagnosis present

## 2024-06-07 DIAGNOSIS — F4321 Adjustment disorder with depressed mood: Secondary | ICD-10-CM | POA: Diagnosis present

## 2024-06-07 DIAGNOSIS — Z634 Disappearance and death of family member: Secondary | ICD-10-CM | POA: Diagnosis not present

## 2024-06-07 DIAGNOSIS — R45850 Homicidal ideations: Secondary | ICD-10-CM | POA: Diagnosis present

## 2024-06-07 DIAGNOSIS — F329 Major depressive disorder, single episode, unspecified: Secondary | ICD-10-CM | POA: Diagnosis present

## 2024-06-07 DIAGNOSIS — Z9152 Personal history of nonsuicidal self-harm: Secondary | ICD-10-CM

## 2024-06-07 DIAGNOSIS — Z9013 Acquired absence of bilateral breasts and nipples: Secondary | ICD-10-CM

## 2024-06-07 DIAGNOSIS — F332 Major depressive disorder, recurrent severe without psychotic features: Secondary | ICD-10-CM

## 2024-06-07 DIAGNOSIS — F4310 Post-traumatic stress disorder, unspecified: Secondary | ICD-10-CM | POA: Diagnosis present

## 2024-06-07 DIAGNOSIS — Z79899 Other long term (current) drug therapy: Secondary | ICD-10-CM

## 2024-06-07 DIAGNOSIS — R45851 Suicidal ideations: Secondary | ICD-10-CM | POA: Insufficient documentation

## 2024-06-07 DIAGNOSIS — Z9151 Personal history of suicidal behavior: Secondary | ICD-10-CM

## 2024-06-07 DIAGNOSIS — F410 Panic disorder [episodic paroxysmal anxiety] without agoraphobia: Secondary | ICD-10-CM | POA: Diagnosis present

## 2024-06-07 DIAGNOSIS — F640 Transsexualism: Secondary | ICD-10-CM | POA: Diagnosis present

## 2024-06-07 DIAGNOSIS — Z818 Family history of other mental and behavioral disorders: Secondary | ICD-10-CM

## 2024-06-07 DIAGNOSIS — G4700 Insomnia, unspecified: Secondary | ICD-10-CM | POA: Diagnosis present

## 2024-06-07 DIAGNOSIS — Z5986 Financial insecurity: Secondary | ICD-10-CM | POA: Diagnosis not present

## 2024-06-07 DIAGNOSIS — Z8249 Family history of ischemic heart disease and other diseases of the circulatory system: Secondary | ICD-10-CM

## 2024-06-07 DIAGNOSIS — Z833 Family history of diabetes mellitus: Secondary | ICD-10-CM

## 2024-06-07 LAB — CBC WITH DIFFERENTIAL/PLATELET
Abs Immature Granulocytes: 0.02 K/uL (ref 0.00–0.07)
Basophils Absolute: 0.1 K/uL (ref 0.0–0.1)
Basophils Relative: 1 %
Eosinophils Absolute: 0 K/uL (ref 0.0–0.5)
Eosinophils Relative: 1 %
HCT: 44.8 % (ref 39.0–52.0)
Hemoglobin: 15.1 g/dL (ref 13.0–17.0)
Immature Granulocytes: 0 %
Lymphocytes Relative: 35 %
Lymphs Abs: 2.1 K/uL (ref 0.7–4.0)
MCH: 28.2 pg (ref 26.0–34.0)
MCHC: 33.7 g/dL (ref 30.0–36.0)
MCV: 83.6 fL (ref 80.0–100.0)
Monocytes Absolute: 0.4 K/uL (ref 0.1–1.0)
Monocytes Relative: 6 %
Neutro Abs: 3.4 K/uL (ref 1.7–7.7)
Neutrophils Relative %: 57 %
Platelets: 379 K/uL (ref 150–400)
RBC: 5.36 MIL/uL (ref 4.22–5.81)
RDW: 13.2 % (ref 11.5–15.5)
Smear Review: NORMAL
WBC: 6 K/uL (ref 4.0–10.5)
nRBC: 0 % (ref 0.0–0.2)

## 2024-06-07 LAB — URINALYSIS, ROUTINE W REFLEX MICROSCOPIC
Bilirubin Urine: NEGATIVE
Glucose, UA: NEGATIVE mg/dL
Hgb urine dipstick: NEGATIVE
Ketones, ur: NEGATIVE mg/dL
Leukocytes,Ua: NEGATIVE
Nitrite: NEGATIVE
Protein, ur: NEGATIVE mg/dL
Specific Gravity, Urine: 1.016 (ref 1.005–1.030)
pH: 7 (ref 5.0–8.0)

## 2024-06-07 LAB — LIPID PANEL
Cholesterol: 200 mg/dL (ref 0–200)
HDL: 63 mg/dL (ref >40)
LDL Cholesterol: 105 mg/dL — ABNORMAL HIGH (ref 0–99)
Total CHOL/HDL Ratio: 3.2 ratio
Triglycerides: 162 mg/dL — ABNORMAL HIGH (ref <150)
VLDL: 32 mg/dL (ref 0–40)

## 2024-06-07 LAB — MAGNESIUM: Magnesium: 1.8 mg/dL (ref 1.7–2.4)

## 2024-06-07 LAB — COMPREHENSIVE METABOLIC PANEL WITH GFR
ALT: 17 U/L (ref 0–44)
AST: 18 U/L (ref 15–41)
Albumin: 3.9 g/dL (ref 3.5–5.0)
Alkaline Phosphatase: 81 U/L (ref 38–126)
Anion gap: 13 (ref 5–15)
BUN: 9 mg/dL (ref 6–20)
CO2: 22 mmol/L (ref 22–32)
Calcium: 9.3 mg/dL (ref 8.9–10.3)
Chloride: 105 mmol/L (ref 98–111)
Creatinine, Ser: 0.85 mg/dL (ref 0.61–1.24)
GFR, Estimated: >60 mL/min (ref >60)
Glucose, Bld: 86 mg/dL (ref 70–99)
Potassium: 3.6 mmol/L (ref 3.5–5.1)
Sodium: 140 mmol/L (ref 135–145)
Total Bilirubin: 0.5 mg/dL (ref 0.0–1.2)
Total Protein: 7 g/dL (ref 6.5–8.1)

## 2024-06-07 LAB — POCT URINE DRUG SCREEN - MANUAL ENTRY (I-SCREEN)
POC Amphetamine UR: NOT DETECTED
POC Buprenorphine (BUP): NOT DETECTED
POC Cocaine UR: NOT DETECTED
POC Marijuana UR: POSITIVE — AB
POC Methadone UR: NOT DETECTED
POC Methamphetamine UR: NOT DETECTED
POC Morphine: NOT DETECTED
POC Oxazepam (BZO): NOT DETECTED
POC Oxycodone UR: NOT DETECTED
POC Secobarbital (BAR): NOT DETECTED

## 2024-06-07 LAB — ETHANOL: Alcohol, Ethyl (B): 16 mg/dL — ABNORMAL HIGH (ref <15)

## 2024-06-07 LAB — TSH: TSH: <0.01 u[IU]/mL — ABNORMAL LOW (ref 0.350–4.500)

## 2024-06-07 MED ORDER — HYDROXYZINE HCL 25 MG PO TABS: 25.0000 mg | ORAL_TABLET | Freq: Three times a day (TID) | ORAL | Status: DC | PRN

## 2024-06-07 MED ORDER — HALOPERIDOL LACTATE 5 MG/ML IJ SOLN: 10.0000 mg | Freq: Three times a day (TID) | INTRAMUSCULAR | Status: DC | PRN

## 2024-06-07 MED ORDER — DIPHENHYDRAMINE HCL 50 MG PO CAPS: 50.0000 mg | ORAL_CAPSULE | Freq: Three times a day (TID) | ORAL | Status: DC | PRN

## 2024-06-07 MED ORDER — HALOPERIDOL LACTATE 5 MG/ML IJ SOLN: 5.0000 mg | Freq: Three times a day (TID) | INTRAMUSCULAR | Status: DC | PRN

## 2024-06-07 MED ORDER — OLANZAPINE 10 MG IM SOLR: 10.0000 mg | Freq: Three times a day (TID) | INTRAMUSCULAR | Status: DC | PRN

## 2024-06-07 MED ORDER — ALUM & MAG HYDROXIDE-SIMETH 200-200-20 MG/5ML PO SUSP: 30.0000 mL | ORAL | Status: DC | PRN

## 2024-06-07 MED ORDER — LORAZEPAM 2 MG/ML IJ SOLN: 2.0000 mg | Freq: Three times a day (TID) | INTRAMUSCULAR | Status: DC | PRN

## 2024-06-07 MED ORDER — OLANZAPINE 10 MG IM SOLR: 5.0000 mg | Freq: Three times a day (TID) | INTRAMUSCULAR | Status: DC | PRN

## 2024-06-07 MED ORDER — ACETAMINOPHEN 325 MG PO TABS
650.0000 mg | ORAL_TABLET | Freq: Four times a day (QID) | ORAL | Status: DC | PRN
  Administered 2024-06-07: 650 mg via ORAL
  Filled 2024-06-07: qty 2

## 2024-06-07 MED ORDER — MAGNESIUM HYDROXIDE 400 MG/5ML PO SUSP: 30.0000 mL | Freq: Every day | ORAL | Status: DC | PRN

## 2024-06-07 MED ORDER — DIPHENHYDRAMINE HCL 50 MG/ML IJ SOLN: 50.0000 mg | Freq: Three times a day (TID) | INTRAMUSCULAR | Status: DC | PRN

## 2024-06-07 MED ORDER — MAGNESIUM HYDROXIDE 400 MG/5ML PO SUSP
30.0000 mL | Freq: Every day | ORAL | Status: DC | PRN
Start: 2024-06-07 — End: 2024-06-07

## 2024-06-07 MED ORDER — HALOPERIDOL 5 MG PO TABS: 5.0000 mg | ORAL_TABLET | Freq: Three times a day (TID) | ORAL | Status: DC | PRN

## 2024-06-07 MED ORDER — TRAZODONE HCL 50 MG PO TABS: 50.0000 mg | ORAL_TABLET | Freq: Every evening | ORAL | Status: DC | PRN
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
## 2024-06-07 NOTE — ED Notes (Signed)
 Pt appears as sullen. He endorses SI with no plan and HI with no plan. He verbally contracts to safety. He denies AVH. He c/o having a migraine he just received Tylenol  at 1800. He denies other physical complaints. He is in bed with eyes closed and unlabored respirations. He is safe on the unit at this time with continuous observation in place.

## 2024-06-07 NOTE — Progress Notes (Signed)
 Pt has been accepted to [facility] on  06/07/2024 . Bed assignment:400- 1  Pt meets inpatient criteria per Donia Snell, NP,  Attending Physician will be Dr. Zouev   Report can be called to: Adult unit: [phone]  Pt can arrive pending labs   Care Team Notified: [facility] Christella Ernst, RN, Donia Snell, NP, Lajuana Nett, RN, Beulah Guan, LPN
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
## 2024-06-07 NOTE — ED Notes (Signed)
 Pt accepted to [id number]. Report called to [place], Charity fundraiser.

## 2024-06-07 NOTE — ED Notes (Signed)
 Pt admitted to observation unit pending labs to transfer to [facility] tonight. Pt is a male to male transgender endorsing SI no plan or intent but unable to contract for safety if discharged. Pt reports having guns in the home but secured.  Pt tearful at times reporting his wife died 1 yr ago and the anniversary just passed but currently going thru a custody battle with the father of his spouse children. Skin assessment completed. Oriented to unit. Meal and drink offered. At currrent, pt continue to endorse SI/HI/AVH. Pt verbally contract for safety. Will monitor for safety.

## 2024-06-08 ENCOUNTER — Encounter (HOSPITAL_COMMUNITY): Payer: Self-pay | Admitting: Psychiatry

## 2024-06-08 ENCOUNTER — Other Ambulatory Visit: Payer: Self-pay

## 2024-06-08 LAB — TSH: TSH: <0.01 u[IU]/mL — ABNORMAL LOW (ref 0.350–4.500)

## 2024-06-08 LAB — VITAMIN D 25 HYDROXY (VIT D DEFICIENCY, FRACTURES): Vit D, 25-Hydroxy: 30.45 ng/mL (ref 30–100)

## 2024-06-08 LAB — HIV ANTIBODY (ROUTINE TESTING W REFLEX): HIV Screen 4th Generation wRfx: NONREACTIVE

## 2024-06-08 LAB — HEMOGLOBIN A1C
Hgb A1c MFr Bld: 5.6 % (ref 4.8–5.6)
Mean Plasma Glucose: 114 mg/dL

## 2024-06-08 LAB — FOLATE: Folate: 8.8 ng/mL (ref >5.9)

## 2024-06-08 LAB — VITAMIN B12: Vitamin B-12: 896 pg/mL (ref 180–914)

## 2024-06-08 MED ORDER — TRAZODONE HCL 50 MG PO TABS
50.0000 mg | ORAL_TABLET | Freq: Every evening | ORAL | Status: DC | PRN
Start: 2024-06-08 — End: 2024-06-12
  Administered 2024-06-08 – 2024-06-11 (×3): 50 mg via ORAL
  Filled 2024-06-08 (×3): qty 1

## 2024-06-08 MED ORDER — HYDROXYZINE HCL 50 MG PO TABS
50.0000 mg | ORAL_TABLET | ORAL | Status: AC
  Administered 2024-06-08: 50 mg via ORAL
  Filled 2024-06-08: qty 1

## 2024-06-08 MED ORDER — METOPROLOL SUCCINATE ER 50 MG PO TB24
50.0000 mg | ORAL_TABLET | Freq: Every day | ORAL | Status: DC
  Administered 2024-06-08 – 2024-06-12 (×6): 50 mg via ORAL
  Filled 2024-06-08 (×5): qty 1

## 2024-06-08 MED ORDER — HALOPERIDOL 5 MG PO TABS: 5.0000 mg | ORAL_TABLET | Freq: Three times a day (TID) | ORAL | Status: DC | PRN

## 2024-06-08 MED ORDER — DIPHENHYDRAMINE HCL 25 MG PO CAPS: 50.0000 mg | ORAL_CAPSULE | Freq: Three times a day (TID) | ORAL | Status: DC | PRN

## 2024-06-08 MED ORDER — GABAPENTIN 100 MG PO CAPS
100.0000 mg | ORAL_CAPSULE | Freq: Every day | ORAL | Status: DC
Start: 2024-06-08 — End: 2024-06-12
  Administered 2024-06-08 – 2024-06-11 (×4): 100 mg via ORAL
  Filled 2024-06-08 (×4): qty 1

## 2024-06-08 MED ORDER — ALBUTEROL SULFATE HFA 108 (90 BASE) MCG/ACT IN AERS
1.0000 | INHALATION_SPRAY | Freq: Four times a day (QID) | RESPIRATORY_TRACT | Status: DC | PRN
  Administered 2024-06-10: 2 via RESPIRATORY_TRACT
  Filled 2024-06-08: qty 13.4

## 2024-06-08 MED ORDER — BUPROPION HCL ER (XL) 150 MG PO TB24
150.0000 mg | ORAL_TABLET | Freq: Three times a day (TID) | ORAL | Status: DC
  Administered 2024-06-08: 150 mg via ORAL
  Filled 2024-06-08: qty 1

## 2024-06-08 MED ORDER — CLONIDINE HCL 0.1 MG PO TABS
0.1000 mg | ORAL_TABLET | ORAL | Status: AC
  Administered 2024-06-08: 0.1 mg via ORAL
  Filled 2024-06-08: qty 1

## 2024-06-08 MED ORDER — VITAMIN B-12 1000 MCG PO TABS
1000.0000 ug | ORAL_TABLET | Freq: Every day | ORAL | Status: DC
  Administered 2024-06-08 – 2024-06-12 (×6): 1000 ug via ORAL
  Filled 2024-06-08 (×5): qty 1

## 2024-06-08 MED ORDER — MIRTAZAPINE 15 MG PO TABS
15.0000 mg | ORAL_TABLET | Freq: Every day | ORAL | Status: DC
  Administered 2024-06-08 – 2024-06-11 (×4): 15 mg via ORAL
  Filled 2024-06-08 (×4): qty 1

## 2024-06-08 MED ORDER — ALUM & MAG HYDROXIDE-SIMETH 200-200-20 MG/5ML PO SUSP: 30.0000 mL | ORAL | Status: DC | PRN

## 2024-06-08 MED ORDER — ACETAMINOPHEN 325 MG PO TABS
650.0000 mg | ORAL_TABLET | Freq: Four times a day (QID) | ORAL | Status: DC | PRN
  Administered 2024-06-08 – 2024-06-11 (×3): 650 mg via ORAL
  Filled 2024-06-08 (×3): qty 2

## 2024-06-08 MED ORDER — MAGNESIUM HYDROXIDE 400 MG/5ML PO SUSP
30.0000 mL | Freq: Every day | ORAL | Status: DC | PRN
  Administered 2024-06-08: 30 mL via ORAL
  Filled 2024-06-08: qty 30

## 2024-06-08 MED ORDER — OLANZAPINE 10 MG IM SOLR: 5.0000 mg | Freq: Three times a day (TID) | INTRAMUSCULAR | Status: DC | PRN

## 2024-06-08 MED ORDER — TESTOSTERONE CYPIONATE 200 MG/ML IJ SOLN
100.0000 mg | INTRAMUSCULAR | Status: DC
Start: 2024-06-21 — End: 2024-06-12

## 2024-06-08 MED ORDER — LEVOTHYROXINE SODIUM 100 MCG PO TABS
200.0000 ug | ORAL_TABLET | Freq: Every day | ORAL | Status: DC
  Administered 2024-06-08 – 2024-06-12 (×6): 200 ug via ORAL
  Filled 2024-06-08 (×5): qty 2

## 2024-06-08 MED ORDER — BUSPIRONE HCL 10 MG PO TABS
10.0000 mg | ORAL_TABLET | Freq: Two times a day (BID) | ORAL | Status: DC
  Administered 2024-06-08 – 2024-06-12 (×11): 10 mg via ORAL
  Filled 2024-06-08 (×9): qty 1

## 2024-06-08 MED ORDER — OLANZAPINE 10 MG IM SOLR: 10.0000 mg | Freq: Three times a day (TID) | INTRAMUSCULAR | Status: DC | PRN

## 2024-06-08 MED ORDER — FLUTICASONE PROPIONATE HFA 220 MCG/ACT IN AERO
1.0000 | INHALATION_SPRAY | Freq: Two times a day (BID) | RESPIRATORY_TRACT | Status: DC
  Administered 2024-06-08 – 2024-06-12 (×7): 1 via RESPIRATORY_TRACT
  Filled 2024-06-08 (×2): qty 12

## 2024-06-08 MED ORDER — NAPROXEN 375 MG PO TABS
375.0000 mg | ORAL_TABLET | Freq: Two times a day (BID) | ORAL | Status: DC | PRN
  Administered 2024-06-11: 375 mg via ORAL
  Filled 2024-06-08: qty 1

## 2024-06-08 NOTE — Plan of Care (Signed)

## 2024-06-08 NOTE — H&P (Signed)
 Psychiatric Admission Assessment Adult  Patient Identification: Anthony Rowe  MRN:  [id number]  Date of Evaluation:  06/08/2024  Chief Complaint:  MDD (major depressive disorder) [F32.9]  Principal Diagnosis: MDD (major depressive disorder)  Diagnosis:  Principal Problem:   MDD (major depressive disorder)  History of Present Illness: (Below information from behavioral health assessment has been reviewed by me and I agreed with the findings): 43 y.o. adult transgender male with sex re-assignment surgery completed. He presents to the [facility] today as a walk in, with complaints of worsening suicidal ideations in the context of psychosocial stressors & unresolved grief While at the [facility], patient reports suicidal thoughts starting from when his wife passed away in 2024-01-21 of last year, 2024,and has been  worsening over the past 24 hours, after the father of his step-daughter came to the school with the Police Department & retrieved the child.   Patient denies any plan at this time to harm himself, but reports that he has an intent to do so.  He reports hx of suicide attempt in his childhood during which he choked himself. He reports homicidal ideations towards his step-Childrens' father but denies having a plan to harm him, but reports an intent to do so. He is unable to promise that he will not bring harm to himself or step-Childrens' father if discharged today. Patient reports Hx of PTSD, MDD, panic attacks & GAD. He reports that he goes to [place] for mental health services. He is unable to recall his mental health medications, with the exception of trazodone . He reports that he prefers 50 mg nightly for sleep. He reports a hx of mental health related hospitalizations, but states that this was in his childhood years.  He reports a hx of self-harming behaviors by cutting himself also in his childhood. Reports occasional AH of deceased wife's voice, otherwise, denies AVH. Denies paranoia, denies delusional  thinking and there are no overt signs of psychosis. 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 He reports worsening intrusive thoughts of suicide, feelings of guilt related to wife's death, states that wife had told him that she was leaving, and he thought she was going somewhere because they had been arguing about something to eat, and he later found her dead, attempted CPR, called emergency services, but they were unable to revive her. He reports unresolved grief related to her death, reports trouble with sleep, reports nightmares, verbalizes readiness to get help for his mental health.   Objective: During this evaluation, [name] presents alert, oriented & aware of situation. He is visible on the unit, attending group sessions. He presents with sad facial expressions. He expressed how much he misses his wife. He reports the intrusive thoughts & flash backs of finding her dead in their apartment. He expressed how much he has worked to make sure his step children are well taking care. He expressed how worried he is about his left arm/hand that got surgically messed by his doctor during his gender re-assignment surgery. 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 He expressed homicidal ideations towards his wife' ex-husband trying to come between him & his step children after their mothers' passing just because he is their biological father. Patient expressed that he was prescribed some medication by  [facility] but was not consistently taking them. He is willing to go back to these medications while in this hospital. He is resumed on those medications including the his antihypertensive medication. He currently denies any SI, AVH, delusional thoughts or paranoia. He does not appear  to be responding to any internal stimuli. He is currently/passively suicidal.  Associated Signs/Symptoms:  Depression Symptoms:  depressed mood, insomnia, feelings of worthlessness/guilt, anxiety,  (Hypo) Manic Symptoms:  Labiality of Mood,  Anxiety Symptoms:  Excessive Worry,  Psychotic Symptoms:  Patient currently denies any SIHI, AVH, delusional thoughts or paranoia. He does note appear to be responding to any internal stimuli.  PTSD Symptoms: I saw my wife slumped after she passed. That still bothers me. Re-experiencing:  Flashbacks Intrusive Thoughts  Total Time spent with patient: 1.5 hours  Past Psychiatric History: Childhood suicidal ideations, hospitalized at the time in a psychiatric hospital.  Is the patient at risk to self? No.  Has the patient been a risk to self in the past 6 months? Yes.    Has the patient been a risk to self within the distant past? Yes.    Is the patient a risk to others? Yes.    Has the patient been a risk to others in the past 6 months? No.  Has the patient been a risk to others within the distant past? No.   [place] Scale:  Flowsheet Row [facility] ([facility] Most recent reading at 06/08/2024 12:34 AM ED from 06/07/2024 in [facility] Most recent reading at 06/07/2024  5:25 PM UC from 05/20/2024 in [facility] Most recent reading at 05/20/2024  8:30 AM  C-SSRS RISK CATEGORY No Risk High Risk No Risk   Prior Inpatient Therapy: Yes.   If yes, describe: During my childhood years.   Prior Outpatient Therapy: Yes.   If yes, describe: [facility].   Alcohol Screening: 1. How often do you have a drink containing alcohol?: Monthly or less 2. How many drinks containing alcohol do you have on a typical day when you are drinking?: 1 or 2 3. How often do you have six or more drinks on one occasion?: Never AUDIT-C Score: 1 4.  How often during the last year have you found that you were not able to stop drinking once you had started?: Never 5. How often during the last year have you failed to do what was normally expected from you because of drinking?: Never 6. How often during the last year have you needed a first drink in the morning to get yourself going after a heavy drinking session?: Never 7. How often during the last year have you had a feeling of guilt of remorse after drinking?: Never 8. How often during the last year have you been unable to remember what happened the night before because you had been drinking?: Never 9. Have you or someone else been injured as a result of your drinking?: No 10. Has a relative or friend or a doctor or another health worker been concerned about your drinking or suggested you cut down?: No Alcohol Use Disorder Identification Test Final Score (AUDIT): 1 Alcohol Brief Interventions/Follow-up: Alcohol education/Brief advice  Substance Abuse History in the last 12 months:  Yes.    Consequences of Substance Abuse: Discussed with patient during this admission evaluation. Medical Consequences:  Liver damage, Possible death by overdose Legal Consequences:  Arrests, jail time, Loss of driving  privilege. Family Consequences:  Family discord, divorce and or separation.  Previous Psychotropic Medications: Yes   Psychological Evaluations: Yes   Past Medical History:  Past Medical History:  Diagnosis Date   Asthma    Bronchitis    COVID-19    Endometriosis    Hypertension    Migraine    Thyroid  disease    hyperthyroidism per pt report    Past Surgical History:  Procedure Laterality Date   ABDOMINAL HYSTERECTOMY     ANKLE SURGERY     MASTECTOMY Bilateral 2017   male to male    Family History:  Family History  Problem Relation Age of Onset   Cancer Mother        lung   Hypertension Maternal Grandmother    Diabetes Maternal Grandmother    Thyroid  disease Neg Hx     Family Psychiatric  History: MDD: My mother, my brother. Mood swings run in my family.  Tobacco Screening:  Social History   Tobacco Use  Smoking Status Never  Smokeless Tobacco Never    BH Tobacco Counseling     Are you interested in Tobacco Cessation Medications?  No value filed. Counseled patient on smoking cessation:  N/A, patient does not use tobacco products Reason Tobacco Screening Not Completed: No value filed.       Social History: Widowed, has 1 son (autistic), self-employed, not homeless. Lives in [place], [place]. Social History   Substance and Sexual Activity  Alcohol Use Yes     Social History   Substance and Sexual Activity  Drug Use Yes   Types: Marijuana    Additional Social History:  Allergies:   Allergies  Allergen Reactions   Frovatriptan Other (See Comments)    Numbness in both legs   Sulfa Antibiotics Other (See Comments) and Itching    Makes patients legs numb   Dilaudid  [Hydromorphone  Hcl]     Pt says medication make his legs go numb   Thiazide-Type Diuretics Other (See Comments)   Oxycodone  Itching    Needs to take in combination with atarax    Lab Results:  Results for orders placed or performed during the hospital encounter of 06/07/24 (from the past 48 hours)  CBC with Differential/Platelet     Status: None   Collection Time: 06/07/24  5:21 PM  Result Value Ref Range   WBC 6.0 4.0 - 10.5 K/uL   RBC 5.36 4.22 - 5.81 MIL/uL   Hemoglobin 15.1 13.0 - 17.0 g/dL   HCT 55.1 60.9 - 47.9 %   MCV 83.6 80.0 - 100.0 fL   MCH 28.2 26.0 - 34.0 pg   MCHC 33.7 30.0 - 36.0 g/dL   RDW 86.7 88.4 - 84.4 %   Platelets 379 150 - 400 K/uL   nRBC 0.0 0.0 - 0.2 %   Neutrophils Relative % 57 %   Neutro Abs 3.4 1.7 - 7.7 K/uL   Lymphocytes Relative 35 %   Lymphs Abs 2.1 0.7 - 4.0 K/uL   Monocytes Relative 6 %   Monocytes Absolute 0.4 0.1 - 1.0 K/uL   Eosinophils Relative 1 %   Eosinophils Absolute 0.0 0.0 - 0.5 K/uL   Basophils Relative 1 %    Basophils Absolute 0.1 0.0 - 0.1 K/uL   WBC Morphology MORPHOLOGY UNREMARKABLE    RBC Morphology MORPHOLOGY UNREMARKABLE    Smear Review Normal platelet morphology    Immature Granulocytes 0 %   Abs Immature Granulocytes 0.02 0.00 - 0.07 K/uL    Comment: Performed  at [facility], [address], [place], [place] [address]  Comprehensive metabolic panel     Status: None   Collection Time: 06/07/24  5:21 PM  Result Value Ref Range   Sodium 140 135 - 145 mmol/L   Potassium 3.6 3.5 - 5.1 mmol/L   Chloride 105 98 - 111 mmol/L   CO2 22 22 - 32 mmol/L   Glucose, Bld 86 70 - 99 mg/dL    Comment: Glucose reference range applies only to samples taken after fasting for at least 8 hours.   BUN 9 6 - 20 mg/dL   Creatinine, Ser 9.14 0.61 - 1.24 mg/dL   Calcium 9.3 8.9 - 89.6 mg/dL   Total Protein 7.0 6.5 - 8.1 g/dL   Albumin 3.9 3.5 - 5.0 g/dL   AST 18 15 - 41 U/L   ALT 17 0 - 44 U/L   Alkaline Phosphatase 81 38 - 126 U/L   Total Bilirubin 0.5 0.0 - 1.2 mg/dL   GFR, Estimated >39 >39 mL/min    Comment: (NOTE) Calculated using the CKD-EPI Creatinine Equation (2021)    Anion gap 13 5 - 15    Comment: Performed at [facility], [address], [place], [place] [address]  Hemoglobin A1c     Status: None   Collection Time: 06/07/24  5:21 PM  Result Value Ref Range   Hgb A1c MFr Bld 5.6 4.8 - 5.6 %    Comment: (NOTE)         Prediabetes: 5.7 - 6.4         Diabetes: >6.4         Glycemic control for adults with diabetes: <7.0    Mean Plasma Glucose 114 mg/dL    Comment: (NOTE) Performed At: [facility] [address] [place], [place] [id number] Jennette Shorter MD Ey:[id number]   Magnesium      Status: None   Collection Time: 06/07/24  5:21 PM  Result Value Ref Range   Magnesium  1.8 1.7 - 2.4 mg/dL    Comment: Performed at [facility], [address] [place], [place] [address]  Ethanol     Status: Abnormal   Collection Time: 06/07/24  5:21 PM  Result Value  Ref Range   Alcohol, Ethyl (B) 16 (H) <15 mg/dL    Comment: (NOTE) For medical purposes only. Performed at [facility], [address] [place], [place] [address]   Lipid panel     Status: Abnormal   Collection Time: 06/07/24  5:21 PM  Result Value Ref Range   Cholesterol 200 0 - 200 mg/dL   Triglycerides 837 (H) <150 mg/dL   HDL 63 >59 mg/dL   Total CHOL/HDL Ratio 3.2 RATIO   VLDL 32 0 - 40 mg/dL   LDL Cholesterol 894 (H) 0 - 99 mg/dL    Comment:        Total Cholesterol/HDL:CHD Risk Coronary Heart Disease Risk Table                     Men   Women  1/2 Average Risk   3.4   3.3  Average Risk       5.0   4.4  2 X Average Risk   9.6   7.1  3 X Average Risk  23.4   11.0        Use the calculated Patient Ratio above and the CHD Risk Table to determine the patient's CHD Risk.  ATP III CLASSIFICATION (LDL):  <100     mg/dL   Optimal  899-870  mg/dL   Near or Above                    Optimal  130-159  mg/dL   Borderline  839-810  mg/dL   High  >809     mg/dL   Very High Performed at [facility], [address] [place], [place] [address]   TSH     Status: Abnormal   Collection Time: 06/07/24  5:21 PM  Result Value Ref Range   TSH <0.010 (L) 0.350 - 4.500 uIU/mL    Comment: Performed by a 3rd Generation assay with a functional sensitivity of <=0.01 uIU/mL. Performed at [facility], [address] [place], [place] [address]   Urinalysis, Routine w reflex microscopic -Urine, Clean Catch     Status: None   Collection Time: 06/07/24  5:42 PM  Result Value Ref Range   Color, Urine YELLOW YELLOW   APPearance CLEAR CLEAR   Specific Gravity, Urine 1.016 1.005 - 1.030   pH 7.0 5.0 - 8.0   Glucose, UA NEGATIVE NEGATIVE mg/dL   Hgb urine dipstick NEGATIVE NEGATIVE   Bilirubin Urine NEGATIVE NEGATIVE   Ketones, ur NEGATIVE NEGATIVE mg/dL   Protein, ur NEGATIVE NEGATIVE mg/dL   Nitrite NEGATIVE NEGATIVE   Leukocytes,Ua NEGATIVE NEGATIVE    Comment:  Performed at [facility], [address] [place], [place] [address]  POCT Urine Drug Screen - (I-Screen)     Status: Abnormal   Collection Time: 06/07/24  5:42 PM  Result Value Ref Range   POC Amphetamine UR None Detected NONE DETECTED (Cut Off Level 1000 ng/mL)   POC Secobarbital (BAR) None Detected NONE DETECTED (Cut Off Level 300 ng/mL)   POC Buprenorphine (BUP) None Detected NONE DETECTED (Cut Off Level 10 ng/mL)   POC Oxazepam (BZO) None Detected NONE DETECTED (Cut Off Level 300 ng/mL)   POC Cocaine UR None Detected NONE DETECTED (Cut Off Level 300 ng/mL)   POC Methamphetamine UR None Detected NONE DETECTED (Cut Off Level 1000 ng/mL)   POC Morphine None Detected NONE DETECTED (Cut Off Level 300 ng/mL)   POC Methadone UR None Detected NONE DETECTED (Cut Off Level 300 ng/mL)   POC Oxycodone  UR None Detected NONE DETECTED (Cut Off Level 100 ng/mL)   POC Marijuana UR Positive (A) NONE DETECTED (Cut Off Level 50 ng/mL)   Blood Alcohol level:  Lab Results  Component Value Date   ETH 16 (H) 06/07/2024   Metabolic Disorder Labs:  Lab Results  Component Value Date   HGBA1C 5.6 06/07/2024   MPG 114 06/07/2024   No results found for: PROLACTIN Lab Results  Component Value Date   CHOL 200 06/07/2024   TRIG 162 (H) 06/07/2024   HDL 63 06/07/2024   CHOLHDL 3.2 06/07/2024   VLDL 32 06/07/2024   LDLCALC 105 (H) 06/07/2024   LDLCALC 106 (H) 05/03/2023   Current Medications: Current Facility-Administered Medications  Medication Dose Route Frequency Provider Last Rate Last Admin   acetaminophen  (TYLENOL ) tablet 650 mg  650 mg Oral Q6H PRN Trudy Carwin, NP   650 mg at 06/08/24 9071   albuterol  (VENTOLIN  HFA) 108 (90 Base) MCG/ACT inhaler 1-2 puff  1-2 puff Inhalation Q6H PRN [place], Mac I, NP       alum & mag hydroxide-simeth (MAALOX/MYLANTA) 200-200-20 MG/5ML suspension 30 mL  30 mL Oral Q4H PRN  Trudy Carwin, NP       buPROPion  (WELLBUTRIN  XL) 24 hr tablet 150 mg  150 mg  Oral TID Malayasia Mirkin I, NP       busPIRone  (BUSPAR ) tablet 10 mg  10 mg Oral BID Kynzie Polgar I, NP       cyanocobalamin  (VITAMIN B12) tablet 1,000 mcg  1,000 mcg Oral Daily Bruce Mayers I, NP       haloperidol  (HALDOL ) tablet 5 mg  5 mg Oral TID PRN Trudy Carwin, NP       And   diphenhydrAMINE  (BENADRYL ) capsule 50 mg  50 mg Oral TID PRN Trudy Carwin, NP       fluticasone  (FLOVENT  HFA) 220 MCG/ACT inhaler 1 puff  1 puff Inhalation BID Bradee Common I, NP       gabapentin  (NEURONTIN ) capsule 100 mg  100 mg Oral QHS Margrete Delude I, NP       levothyroxine  (SYNTHROID ) tablet 200 mcg  200 mcg Oral Daily Audon Heymann I, NP       magnesium  hydroxide (MILK OF MAGNESIA) suspension 30 mL  30 mL Oral Daily PRN Trudy Carwin, NP       metoprolol  succinate (TOPROL -XL) 24 hr tablet 50 mg  50 mg Oral Daily Ketara Cavness I, NP       mirtazapine  (REMERON ) tablet 15 mg  15 mg Oral QHS Kendry Pfarr I, NP       naproxen  (NAPROSYN ) tablet 375 mg  375 mg Oral BID PRN Collene Gouge I, NP       OLANZapine  (ZYPREXA ) injection 10 mg  10 mg Intramuscular TID PRN Trudy Carwin, NP       OLANZapine  (ZYPREXA ) injection 5 mg  5 mg Intramuscular TID PRN Trudy Carwin, NP       [name] ON 06/21/2024] Testosterone  Cypionate SOLN 100 mg  100 mg Injection Q14 Days Marico Buckle I, NP       traZODone  (DESYREL ) tablet 50 mg  50 mg Oral QHS PRN Fayetta Sorenson I, NP       PTA Medications: Medications Prior to Admission  Medication Sig Dispense Refill Last Dose/Taking   albuterol  (VENTOLIN  HFA) 108 (90 Base) MCG/ACT inhaler Inhale 1-2 puffs into the lungs every 6 (six) hours as needed for wheezing or shortness of breath. 8 g 0 Past Month   ascorbic acid (VITAMIN C) 1000 MG tablet Take 1,000 mg by mouth daily.   Past Week   Ashwagandha (FT ASHWAGANDHA EXTRACT) 500 MG CAPS Take 1 tablet by mouth daily.   Past Week   azithromycin (ZITHROMAX) 250 MG tablet Take 250 mg by mouth as directed. 3 doses remaining   Past Week   busPIRone  (BUSPAR )  10 MG tablet Take 10 mg by mouth 2 (two) times daily.   Past Month   cholecalciferol (VITAMIN D3) 25 MCG (1000 UNIT) tablet Take 1,000 Units by mouth daily.   Past Week   cyanocobalamin  (VITAMIN B12) 1000 MCG tablet Take 1,000 mcg by mouth daily.   Past Week   DOCOSAHEXAENOIC ACID PO Take 1 tablet by mouth daily.   Past Week   fluticasone  (FLOVENT  HFA) 220 MCG/ACT inhaler Inhale 2 puffs into the lungs twice daily 1 Inhaler 0 Past Month   gabapentin  (NEURONTIN ) 100 MG capsule Take 100 mg by mouth at bedtime.   Past Week   hydrOXYzine  (ATARAX ) 50 MG tablet Take 50 mg by mouth at bedtime as needed for itching.   06/06/2024   MAGNESIUM  OXIDE, ELEMENTAL, PO Take 1 tablet  by mouth daily.   Past Week   mirtazapine  (REMERON ) 15 MG tablet Take 15 mg by mouth at bedtime.   Past Month   naproxen  (NAPROSYN ) 375 MG tablet Take 1 tablet (375 mg total) by mouth 2 (two) times daily as needed (muscle pain). 14 tablet 0 Past Week   Oxycodone  HCl 20 MG TABS Take 1 tablet by mouth 4 (four) times daily as needed.   06/07/2024 Morning   Testosterone  Cypionate 200 MG/ML SOLN Inject 100 mg as directed every 14 (fourteen) days. 2 mL 5 06/07/2024 Morning   traZODone  (DESYREL ) 50 MG tablet Take 50 mg by mouth at bedtime as needed for sleep.   Past Month   Zinc Sulfate (ZINC 15 PO) Take 1 capsule by mouth daily.   Past Month   buPROPion  (WELLBUTRIN  XL) 150 MG 24 hr tablet Take 150 mg by mouth 3 (three) times daily.   06/06/2024   levothyroxine  (SYNTHROID ) 200 MCG tablet Take 1 tablet (200 mcg total) by mouth daily. 90 tablet 3 06/06/2024   methocarbamol  (ROBAXIN ) 500 MG tablet Take 1 tablet (500 mg total) by mouth 2 (two) times daily. 20 tablet 0 Unknown   metoprolol  succinate (TOPROL -XL) 50 MG 24 hr tablet Take 1 tablet by mouth daily.   06/06/2024   ondansetron  (ZOFRAN ) 4 MG tablet Take 4 mg by mouth every 8 (eight) hours as needed for nausea or vomiting.   Unknown   Syringe/Needle, Disp, 22G X 1-1/2 1 ML MISC 1 Device by Does not  apply route every 14 (fourteen) days. 10 each 3    tirzepatide (ZEPBOUND) 5 MG/0.5ML Pen Inject 5 mg into the skin once a week. Not yet started      AIMS:  ,  ,  ,  ,  ,  ,    Musculoskeletal: Strength & Muscle Tone: within normal limits Gait & Station: normal Patient leans: N/A  Psychiatric Specialty Exam:  Presentation  General Appearance:  Casual; Fairly Groomed  Eye Contact: Good  Speech: Clear and Coherent; Normal Rate  Speech Volume: Normal  Handedness: Right   Mood and Affect  Mood: Anxious; Depressed  Affect: Congruent; Depressed; Flat   Thought Process  Thought Processes: Coherent  Duration of Psychotic Symptoms:N/A  Past Diagnosis of Schizophrenia or Psychoactive disorder: No data recorded  Descriptions of Associations:Intact  Orientation:Full (Time, Place and Person)  Thought Content:Logical  Hallucinations:Hallucinations: None; Auditory Description of Auditory Hallucinations: I hear my wife's voice sometimes.  Ideas of Reference:None  Suicidal Thoughts:Suicidal Thoughts: No SI Active Intent and/or Plan: Without Plan; With Means to Carry Out; With Intent  Homicidal Thoughts:Homicidal Thoughts: Yes, Passive HI Active Intent and/or Plan: Without Means to Carry Out HI Passive Intent and/or Plan: Without Intent; Without Means to Carry Out; Without Access to Means; Without Plan  Sensorium  Memory: Immediate Good; Recent Good; Remote Good  Judgment: Fair  Insight: Fair   Chartered certified accountant: Fair  Attention Span: Fair  Recall: Metta Abe of Knowledge: Fair  Language: Good  Psychomotor Activity  Psychomotor Activity: Psychomotor Activity: Normal  Assets  Assets: Communication Skills; Desire for Improvement; Financial Resources/Insurance; Housing; Resilience  Sleep  Sleep: Sleep: Fair  Estimated Sleeping Duration (Last 24 Hours): 5.00 hours  Physical Exam: Physical Exam HENT:     Head:  Normocephalic.     Nose: Nose normal.  Cardiovascular:     Pulses: Normal pulses.     Comments: Blood pressure is elevated.  Received Clonidine  0. 1 mg once. Pulmonary:  Effort: Pulmonary effort is normal.  Genitourinary:    Comments: Deferred. Musculoskeletal:        General: Normal range of motion.     Cervical back: Normal range of motion.  Skin:    General: Skin is dry.  Neurological:     General: No focal deficit present.     Mental Status: He is alert and oriented to person, place, and time.    Review of Systems  Constitutional:  Negative for chills, diaphoresis and fever.  HENT:  Negative for congestion and sore throat.   Eyes:  Negative for blurred vision.  Respiratory:  Negative for cough, shortness of breath and wheezing.   Cardiovascular:  Negative for chest pain and palpitations.  Gastrointestinal:  Negative for abdominal pain, constipation, diarrhea, heartburn, nausea and vomiting.  Genitourinary:  Positive for dysuria.  Neurological:  Negative for dizziness, tingling, tremors, sensory change, speech change, focal weakness, seizures, loss of consciousness, weakness and headaches.  Endo/Heme/Allergies:        Reviewed allergy lists.  Psychiatric/Behavioral:  Positive for depression, hallucinations, substance abuse (BAL 16, UDS (+) for THC.) and suicidal ideas. Negative for memory loss. The patient is nervous/anxious and has insomnia.    Blood pressure 121/79, pulse 74, temperature 98.6 F (37 C), temperature source Oral, resp. rate 20, height 5' 3 (1.6 m), weight 70.9 kg, SpO2 97%. Body mass index is 27.67 kg/m.  Treatment Plan Summary: Daily contact with patient to assess and evaluate symptoms and progress in treatment and Medication management.  Principal/active diagnoses.  MDD (major depressive disorder).  Gender Dysphoria.   Medical:  Hypothyroidism. HTN.   Plan: The risks/benefits/side-effects/alternatives to the medications in use were discussed  in detail with the patient and time was given for patient's questions. The patient consents to medication trial.   Resumed medications:  -Buspar  10 mg po bid for anxiety.  -Wellbutrin  XL 150 mg po daily for depression.  -Initiated hydroxyzine  25 mg po tid prn for anxiety.  -Administer hydroxyzine  50 mg po once for anxiety.  -Mirtazapine  15 mg po Q hs for depression/insomnia.  -Trazodone  50 mg po Q hs prn for insomnia.  -Continue gabapentin  100 mg po daily at bedtime.  Agitation protocols.  -Continue as recommended (see MAR).  Medical.  -Continue Albuterol  inhaler 1-2 puffs Q 6 hrs prn for SOB.  -Continue Vitamin B12 1,000 mg po daily for supplementation.  -Continue Flovent  inhaler 1 puff bid for SOB.  -Continue Testosterone  soln 100 mg by injection every 14 days.  Other PRNS -Continue Tylenol  650 mg every 6 hours PRN for mild pain -Continue Maalox 30 ml Q 4 hrs PRN for indigestion -Continue MOM 30 ml po Q 6 hrs for constipation  Safety and Monitoring: Voluntary admission to inpatient psychiatric unit for safety, stabilization and treatment Daily contact with patient to assess and evaluate symptoms and progress in treatment Patient's case to be discussed in multi-disciplinary team meeting Observation Level : q15 minute checks Vital signs: q12 hours Precautions: Safety  Discharge Planning: Social work and case management to assist with discharge planning and identification of hospital follow-up needs prior to discharge Estimated LOS: 5-7 days Discharge Concerns: Need to establish a safety plan; Medication compliance and effectiveness Discharge Goals: Return home with outpatient referrals for mental health follow-up including medication management/psychotherapy  Observation Level/Precautions:  15 minute checks  Laboratory:  Per ED, current lab results reviewed.  Psychotherapy: Enrolled in the group sessions.  Medications: See MAR.  Consultations: As needed.   Discharge  Concerns:  See H&P.  Estimated LOS: 3-5 days.  Other: NA    Physician Treatment Plan for Primary Diagnosis: MDD (major depressive disorder) Long Term Goal(s): Improvement in symptoms so as ready for discharge  Short Term Goals: Ability to identify changes in lifestyle to reduce recurrence of condition will improve, Ability to verbalize feelings will improve, Ability to disclose and discuss suicidal ideas, and Ability to demonstrate self-control will improve  Physician Treatment Plan for Secondary Diagnosis: Principal Problem:   MDD (major depressive disorder)  Long Term Goal(s): Improvement in symptoms so as ready for discharge  Short Term Goals: Ability to identify and develop effective coping behaviors will improve, Ability to maintain clinical measurements within normal limits will improve, Compliance with prescribed medications will improve, and Ability to identify triggers associated with substance abuse/mental health issues will improve  I certify that inpatient services furnished can reasonably be expected to improve the patient's condition.    Mac Bolster, NP, pmhnp, fnp-bc. 8/7/20253:28 PM
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
## 2024-06-08 NOTE — Progress Notes (Signed)
 Anthony Rowe is a 43 y.o. adult voluntarily admitted for suicide and homicidal ideation due to increased anxiety, depression and unresolved grief. Pt reports suicidal started from when his wife passed away in 2024/01/11 of last year, and worsened over the past 24 hours, after the father of his daughter came to school and retrieved the child. This has led him to wanting to kill the father of his step daughter. Reports occasional AH of deceased wife's voice, otherwise, denies SI/AVH during admission. Pt has been alert and oriented, calm and cooperative with the process. Consents signed, skin/belongings search completed and pt oriented to unit. Pt stable at this time. Pt given the opportunity to express concerns and ask questions. Pt given toiletries. Will continue to monitor.

## 2024-06-08 NOTE — BHH Group Notes (Signed)
 [facility] Group Notes:  (Nursing/MHT/Case Management/Adjunct)  Date:  06/08/2024  Time:  9:57 PM  Type of Therapy:  Wrap-up group  Participation Level:  Active  Participation Quality:  Appropriate  Affect:  Appropriate  Cognitive:  Appropriate  Insight:  Appropriate  Engagement in Group:  Engaged  Modes of Intervention:  Education  Summary of Progress/Problems: Goal to control his anger. Rated day 6/10.   Anthony Rowe 06/08/2024, 9:57 PM

## 2024-06-08 NOTE — Plan of Care (Signed)
   Problem: Education: Goal: Knowledge of [facility] Education information/materials will improve Outcome: Progressing   Problem: Coping: Goal: Ability to verbalize frustrations and anger appropriately will improve Outcome: Progressing   Problem: Safety: Goal: Periods of time without injury will increase Outcome: Progressing
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
## 2024-06-08 NOTE — Group Note (Signed)
 Occupational Therapy Group Note  Group Topic:Coping Skills  Group Date: 06/08/2024 Start Time: 1430 End Time: 1500 Facilitators: Dot [place] MATSU, OT   Group Description: Group encouraged increased engagement and participation through discussion and activity focused on Coping Ahead. Patients were split up into teams and selected a card from a stack of positive coping strategies. Patients were instructed to act out/charade the coping skill for other peers to guess and receive points for their team. Discussion followed with a focus on identifying additional positive coping strategies and patients shared how they were going to cope ahead over the weekend while continuing hospitalization stay.  Therapeutic Goal(s): Identify positive vs negative coping strategies. Identify coping skills to be used during hospitalization vs coping skills outside of hospital/at home Increase participation in therapeutic group environment and promote engagement in treatment   Participation Level: Engaged   Participation Quality: Independent   Behavior: Appropriate   Speech/Thought Process: Relevant   Affect/Mood: Appropriate   Insight: Fair   Judgement: Fair      Modes of Intervention: Education  Patient Response to Interventions:  Attentive   Plan: Continue to engage patient in OT groups 2 - 3x/week.  06/08/2024  [place] MATSU Dot, OT Mikeal Winstanley, OT

## 2024-06-08 NOTE — Group Note (Signed)
 LCSW Group Therapy Note   Group Date: 06/08/2024 Start Time: 1100 End Time: 1200   Participation:  did not attend  Type of Therapy:  Group Therapy  Topic:  Finding Balance: Using Wise Mind for Thoughtful Decisions  Objective:  To help participants understand and apply the concept of Delsie Mind to make balanced, thoughtful decisions by integrating emotion and logic.  Goals: Learn the differences between Emotional Mind, Reasonable Mind, and [employer]. Recognize personal signs of Emotional and Reasonable Mind. Practice using [employer] in real-life scenarios.  Therapeutic Modalities: Elements of Dialectical Behavior Therapy (DBT):  Mindfulness (noticing thoughts and emotions without judgment), Emotion Regulation (understanding and managing emotional responses), Distress Tolerance (coping with difficult situations without making them worse), Wise Mind (integrating emotion and reason for balanced decision-making) Elements of Cognitive Behavioral Therapy (CBT):  Identifying automatic thoughts, Challenging cognitive distortions, Using logic to reframe unhelpful thinking patterns  Summary:  This class focused on Wise Mind - DBT's concept of balancing Emotional Mind and Reasonable Mind. We identified when we're in each state and practiced using Wise Mind to respond thoughtfully in real-life situations. By combining emotion and logic, participants can improve decision-making, manage challenges, and enhance relationships.   Retal Tonkinson O Alleah Dearman, LCSWA 06/08/2024  12:20 PM

## 2024-06-08 NOTE — Progress Notes (Signed)
(  Sleep Hours) - 5 (Any PRNs that were needed, meds refused, or side effects to meds)- none (Any disturbances and when (visitation, over night)- none (Concerns raised by the patient)- none (SI/HI/AVH)- Denies

## 2024-06-08 NOTE — Progress Notes (Signed)
   06/08/24 0928  Psych Admission Type (Psych Patients Only)  Admission Status Voluntary  Psychosocial Assessment  Patient Complaints Anxiety;Depression;Anger  Eye Contact Fair  Facial Expression Animated  Affect Anxious;Appropriate to circumstance;Depressed  Speech Logical/coherent  Interaction Assertive  Motor Activity Other (Comment) (WNL)  Appearance/Hygiene In scrubs  Behavior Characteristics Cooperative;Appropriate to situation  Mood Depressed;Anxious  Aggressive Behavior  Targets Other (Comment) (Step daugther's Father)  Type of Behavior Verbal  Effect No apparent injury  Thought Process  Coherency Circumstantial  Content Blaming others  Delusions None reported or observed  Perception WDL  Hallucination None reported or observed  Judgment Poor  Confusion None  Danger to Self  Current suicidal ideation? Denies  Agreement Not to Harm Self Yes  Description of Agreement Verbal  Danger to Others  Danger to Others None reported or observed
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
## 2024-06-09 ENCOUNTER — Encounter (HOSPITAL_COMMUNITY): Payer: Self-pay

## 2024-06-09 LAB — RPR: RPR Ser Ql: NONREACTIVE

## 2024-06-09 LAB — T4, FREE: Free T4: 1.14 ng/dL — ABNORMAL HIGH (ref 0.61–1.12)

## 2024-06-09 MED ORDER — BUPROPION HCL ER (XL) 150 MG PO TB24
150.0000 mg | ORAL_TABLET | Freq: Every day | ORAL | Status: DC
  Administered 2024-06-10 – 2024-06-12 (×4): 150 mg via ORAL
  Filled 2024-06-09 (×3): qty 1

## 2024-06-09 NOTE — Plan of Care (Signed)
 Problem: Education: Goal: Knowledge of [facility] Education information/materials will improve Outcome: Progressing Goal: Emotional status will improve Outcome: Progressing Goal: Mental status will improve Outcome: Progressing Goal: Verbalization of understanding the information provided will improve Outcome: Progressing   Problem: Activity: Goal: Interest or engagement in activities will improve Outcome: Progressing Goal: Sleeping patterns will improve Outcome: Progressing   Problem: Coping: Goal: Ability to verbalize frustrations and anger appropriately will improve Outcome: Progressing Goal: Ability to demonstrate self-control will improve Outcome: Progressing   Problem: Health Behavior/Discharge Planning: Goal: Identification of resources available to assist in meeting health care needs will improve Outcome: Progressing Goal: Compliance with treatment plan for underlying cause of condition will improve Outcome: Progressing   Problem: Physical Regulation: Goal: Ability to maintain clinical measurements within normal limits will improve Outcome: Progressing   Problem: Safety: Goal: Periods of time without injury will increase Outcome: Progressing   Problem: Education: Goal: Knowledge of [facility] Education information/materials will improve Outcome: Progressing Goal: Emotional status will improve Outcome: Progressing Goal: Mental status will improve Outcome: Progressing Goal: Verbalization of understanding the information provided will improve Outcome: Progressing   Problem: Activity: Goal: Interest or engagement in activities will improve Outcome: Progressing Goal: Sleeping patterns will improve Outcome: Progressing   Problem: Coping: Goal: Ability to verbalize frustrations and anger appropriately will improve Outcome: Progressing Goal: Ability to demonstrate self-control will improve Outcome: Progressing   Problem: Health  Behavior/Discharge Planning: Goal: Identification of resources available to assist in meeting health care needs will improve Outcome: Progressing Goal: Compliance with treatment plan for underlying cause of condition will improve Outcome: Progressing   Problem: Physical Regulation: Goal: Ability to maintain clinical measurements within normal limits will improve Outcome: Progressing   Problem: Safety: Goal: Periods of time without injury will increase Outcome: Progressing   Problem: Education: Goal: Ability to make informed decisions regarding treatment will improve Outcome: Progressing   Problem: Coping: Goal: Coping ability will improve Outcome: Progressing   Problem: Health Behavior/Discharge Planning: Goal: Identification of resources available to assist in meeting health care needs will improve Outcome: Progressing   Problem: Medication: Goal: Compliance with prescribed medication regimen will improve Outcome: Progressing   Problem: Self-Concept: Goal: Ability to disclose and discuss suicidal ideas will improve Outcome: Progressing Goal: Will verbalize positive feelings about self Outcome: Progressing Note: Patient is on track. Patient will maintain adherence    Problem: Activity: Goal: Will identify at least one activity in which they can participate Outcome: Progressing   Problem: Coping: Goal: Ability to identify and develop effective coping behavior will improve Outcome: Progressing Goal: Ability to interact with others will improve Outcome: Progressing Goal: Demonstration of participation in decision-making regarding own care will improve Outcome: Progressing Goal: Ability to use eye contact when communicating with others will improve Outcome: Progressing   Problem: Health Behavior/Discharge Planning: Goal: Identification of resources available to assist in meeting health care needs will improve Outcome: Progressing   Problem: Self-Concept: Goal:  Will verbalize positive feelings about self Outcome: Progressing   Problem: Education: Goal: Ability to state activities that reduce stress will improve Outcome: Progressing   Problem: Coping: Goal: Ability to identify and develop effective coping behavior will improve Outcome: Progressing   Problem: Self-Concept: Goal: Ability to identify factors that promote anxiety will improve Outcome: Progressing Goal: Level of anxiety will decrease Outcome: Progressing Goal: Ability to modify response to factors that promote anxiety will improve Outcome: Progressing   Problem: Education: Goal: Utilization of techniques to improve thought processes will improve  Outcome: Progressing Goal: Knowledge of the prescribed therapeutic regimen will improve Outcome: Progressing   Problem: Activity: Goal: Interest or engagement in leisure activities will improve Outcome: Progressing Goal: Imbalance in normal sleep/wake cycle will improve Outcome: Progressing   Problem: Coping: Goal: Coping ability will improve Outcome: Progressing Goal: Will verbalize feelings Outcome: Progressing

## 2024-06-09 NOTE — Progress Notes (Signed)
   06/09/24 1600  Psychosocial Assessment  Patient Complaints Anxiety  Eye Contact Brief  Facial Expression Animated  Affect Anxious;Appropriate to circumstance  Speech Logical/coherent  Interaction Assertive  Motor Activity Other (Comment) (WDL)  Appearance/Hygiene Unremarkable  Behavior Characteristics Cooperative  Mood Depressed;Anxious  Thought Process  Coherency WDL  Content Blaming others  Delusions None reported or observed  Perception WDL  Hallucination None reported or observed  Judgment Impaired  Confusion None  Danger to Self  Current suicidal ideation? Denies  Danger to Others  Danger to Others None reported or observed

## 2024-06-09 NOTE — Care Management Important Message (Signed)
 Patient informed of right to appeal discharge, provided phone number to [employer]. Patient expressed no interest in appealing discharge at this time. CSW will continue to monitor situation.   [name], LCSWA 06/09/2024

## 2024-06-09 NOTE — Group Note (Signed)
 Recreation Therapy Group Note   Group Topic:Problem Solving  Group Date: 06/09/2024 Start Time: 0930 End Time: 1005 Facilitators: Zailey Audia-McCall, LRT,CTRS Location: [address] Day[address]   Group Topic: Problem Solving  Goal Area(s) Addresses:  Patient will identify positive ways to complete puzzles presented.  Patient will work effectively with peer to solve the puzzles presented in group.  Behavioral Response:    Intervention: Group Work, Problem Solving  Activity: In pairs, patients were asked to create a game with their teammate. Team's were tasked with designing a game, including a Name, Description of Game, Equipment/Supplies, Rules, and Number of players needed. Teach back used to present their game idea to the group.  Education:  Leisure Scientist, physiological, Special educational needs teacher, Teamwork, Discharge Planning  Education Outcome: Acknowledges education/In group clarification offered/Needs additional education.    Affect/Mood: N/A   Participation Level: Did not attend    Clinical Observations/Individualized Feedback:      Plan: Continue to engage patient in RT group sessions 2-3x/week.   Ante Arredondo-McCall, LRT,CTRS 06/09/2024 12:50 PM

## 2024-06-09 NOTE — Group Note (Signed)
 Date:  06/09/2024 Time:  4:21 PM  Group Topic/Focus:  Healthy Communication:   The focus of this group is to discuss communication, barriers to communication, as well as healthy ways to communicate with others. We discussed healthy personal boundaries.    Participation Level:  Minimal  Participation Quality:  Inattentive  Affect:  Appropriate  Cognitive:  Alert, Appropriate, and Oriented  Insight: Improving  Engagement in Group:  Developing/Improving  Modes of Intervention:  Discussion   Anthony Rowe 06/09/2024, 4:21 PM

## 2024-06-09 NOTE — Plan of Care (Signed)
 ?  Problem: Education: ?Goal: Mental status will improve ?Outcome: Progressing ?Goal: Verbalization of understanding the information provided will improve ?Outcome: Progressing ?  ?

## 2024-06-09 NOTE — BH IP Treatment Plan (Signed)
 Interdisciplinary Treatment and Diagnostic Plan Update  06/09/2024 Time of Session: 10:45 AM Anthony Rowe MRN: [id number]  Principal Diagnosis: MDD (major depressive disorder)  Secondary Diagnoses: Principal Problem:   MDD (major depressive disorder)   Current Medications:  Current Facility-Administered Medications  Medication Dose Route Frequency Provider Last Rate Last Admin   acetaminophen  (TYLENOL ) tablet 650 mg  650 mg Oral Q6H PRN Trudy Carwin, NP   650 mg at 06/08/24 1957   albuterol  (VENTOLIN  HFA) 108 (90 Base) MCG/ACT inhaler 1-2 puff  1-2 puff Inhalation Q6H PRN Collene Gouge I, NP       alum & mag hydroxide-simeth (MAALOX/MYLANTA) 200-200-20 MG/5ML suspension 30 mL  30 mL Oral Q4H PRN Trudy Carwin, NP       [name] ON 06/10/2024] buPROPion  (WELLBUTRIN  XL) 24 hr tablet 150 mg  150 mg Oral Daily Parker, Alvin S, MD       busPIRone  (BUSPAR ) tablet 10 mg  10 mg Oral BID Nwoko, Agnes I, NP   10 mg at 06/09/24 9049   cyanocobalamin  (VITAMIN B12) tablet 1,000 mcg  1,000 mcg Oral Daily Nwoko, Agnes I, NP   1,000 mcg at 06/09/24 9049   haloperidol  (HALDOL ) tablet 5 mg  5 mg Oral TID PRN Trudy Carwin, NP       And   diphenhydrAMINE  (BENADRYL ) capsule 50 mg  50 mg Oral TID PRN Trudy Carwin, NP       fluticasone  (FLOVENT  HFA) 220 MCG/ACT inhaler 1 puff  1 puff Inhalation BID Collene Gouge I, NP   1 puff at 06/08/24 1955   gabapentin  (NEURONTIN ) capsule 100 mg  100 mg Oral QHS Collene Gouge I, NP   100 mg at 06/08/24 2100   levothyroxine  (SYNTHROID ) tablet 200 mcg  200 mcg Oral Daily Collene Gouge I, NP   200 mcg at 06/09/24 9367   magnesium  hydroxide (MILK OF MAGNESIA) suspension 30 mL  30 mL Oral Daily PRN Trudy Carwin, NP   30 mL at 06/08/24 1957   metoprolol  succinate (TOPROL -XL) 24 hr tablet 50 mg  50 mg Oral Daily Nwoko, Agnes I, NP   50 mg at 06/09/24 0950   mirtazapine  (REMERON ) tablet 15 mg  15 mg Oral QHS Nwoko, Agnes I, NP   15 mg at 06/08/24 2101   naproxen  (NAPROSYN ) tablet 375  mg  375 mg Oral BID PRN Collene Gouge I, NP       OLANZapine  (ZYPREXA ) injection 10 mg  10 mg Intramuscular TID PRN Trudy Carwin, NP       OLANZapine  (ZYPREXA ) injection 5 mg  5 mg Intramuscular TID PRN Trudy Carwin, NP       [name] ON 06/21/2024] Testosterone  Cypionate SOLN 100 mg  100 mg Injection Q14 Days Nwoko, Agnes I, NP       traZODone  (DESYREL ) tablet 50 mg  50 mg Oral QHS PRN Nwoko, Agnes I, NP   50 mg at 06/08/24 2102   PTA Medications: Medications Prior to Admission  Medication Sig Dispense Refill Last Dose/Taking   albuterol  (VENTOLIN  HFA) 108 (90 Base) MCG/ACT inhaler Inhale 1-2 puffs into the lungs every 6 (six) hours as needed for wheezing or shortness of breath. 8 g 0 Past Month   ascorbic acid (VITAMIN C) 1000 MG tablet Take 1,000 mg by mouth daily.   Past Week   Ashwagandha (FT ASHWAGANDHA EXTRACT) 500 MG CAPS Take 1 tablet by mouth daily.   Past Week   azithromycin (ZITHROMAX) 250 MG tablet Take 250 mg by mouth as directed.  3 doses remaining   Past Week   busPIRone  (BUSPAR ) 10 MG tablet Take 10 mg by mouth 2 (two) times daily.   Past Month   cholecalciferol (VITAMIN D3) 25 MCG (1000 UNIT) tablet Take 1,000 Units by mouth daily.   Past Week   cyanocobalamin  (VITAMIN B12) 1000 MCG tablet Take 1,000 mcg by mouth daily.   Past Week   DOCOSAHEXAENOIC ACID PO Take 1 tablet by mouth daily.   Past Week   fluticasone  (FLOVENT  HFA) 220 MCG/ACT inhaler Inhale 2 puffs into the lungs twice daily 1 Inhaler 0 Past Month   gabapentin  (NEURONTIN ) 100 MG capsule Take 100 mg by mouth at bedtime.   Past Week   hydrOXYzine  (ATARAX ) 50 MG tablet Take 50 mg by mouth at bedtime as needed for itching.   06/06/2024   MAGNESIUM  OXIDE, ELEMENTAL, PO Take 1 tablet by mouth daily.   Past Week   mirtazapine  (REMERON ) 15 MG tablet Take 15 mg by mouth at bedtime.   Past Month   naproxen  (NAPROSYN ) 375 MG tablet Take 1 tablet (375 mg total) by mouth 2 (two) times daily as needed (muscle pain). 14 tablet 0 Past  Week   Oxycodone  HCl 20 MG TABS Take 1 tablet by mouth 4 (four) times daily as needed.   06/07/2024 Morning   Testosterone  Cypionate 200 MG/ML SOLN Inject 100 mg as directed every 14 (fourteen) days. 2 mL 5 06/07/2024 Morning   traZODone  (DESYREL ) 50 MG tablet Take 50 mg by mouth at bedtime as needed for sleep.   Past Month   Zinc Sulfate (ZINC 15 PO) Take 1 capsule by mouth daily.   Past Month   buPROPion  (WELLBUTRIN  XL) 150 MG 24 hr tablet Take 150 mg by mouth 3 (three) times daily.   06/06/2024   levothyroxine  (SYNTHROID ) 200 MCG tablet Take 1 tablet (200 mcg total) by mouth daily. 90 tablet 3 06/06/2024   methocarbamol  (ROBAXIN ) 500 MG tablet Take 1 tablet (500 mg total) by mouth 2 (two) times daily. 20 tablet 0 Unknown   metoprolol  succinate (TOPROL -XL) 50 MG 24 hr tablet Take 1 tablet by mouth daily.   06/06/2024   ondansetron  (ZOFRAN ) 4 MG tablet Take 4 mg by mouth every 8 (eight) hours as needed for nausea or vomiting.   Unknown   Syringe/Needle, Disp, 22G X 1-1/2 1 ML MISC 1 Device by Does not apply route every 14 (fourteen) days. 10 each 3    tirzepatide (ZEPBOUND) 5 MG/0.5ML Pen Inject 5 mg into the skin once a week. Not yet started       Patient Stressors: Financial difficulties   Health problems   Substance abuse   Traumatic event    Patient Strengths: Capable of independent living  Manufacturing systems engineer  Motivation for treatment/growth  Supportive family/friends   Treatment Modalities: Medication Management, Group therapy, Case management,  1 to 1 session with clinician, Psychoeducation, Recreational therapy.   Physician Treatment Plan for Primary Diagnosis: MDD (major depressive disorder) Long Term Goal(s): Improvement in symptoms so as ready for discharge   Short Term Goals: Ability to identify and develop effective coping behaviors will improve Ability to maintain clinical measurements within normal limits will improve Compliance with prescribed medications will  improve Ability to identify triggers associated with substance abuse/mental health issues will improve Ability to identify changes in lifestyle to reduce recurrence of condition will improve Ability to verbalize feelings will improve Ability to disclose and discuss suicidal ideas Ability to demonstrate self-control will improve  Medication  Management: Evaluate patient's response, side effects, and tolerance of medication regimen.  Therapeutic Interventions: 1 to 1 sessions, Unit Group sessions and Medication administration.  Evaluation of Outcomes: Not Progressing  Physician Treatment Plan for Secondary Diagnosis: Principal Problem:   MDD (major depressive disorder)  Long Term Goal(s): Improvement in symptoms so as ready for discharge   Short Term Goals: Ability to identify and develop effective coping behaviors will improve Ability to maintain clinical measurements within normal limits will improve Compliance with prescribed medications will improve Ability to identify triggers associated with substance abuse/mental health issues will improve Ability to identify changes in lifestyle to reduce recurrence of condition will improve Ability to verbalize feelings will improve Ability to disclose and discuss suicidal ideas Ability to demonstrate self-control will improve     Medication Management: Evaluate patient's response, side effects, and tolerance of medication regimen.  Therapeutic Interventions: 1 to 1 sessions, Unit Group sessions and Medication administration.  Evaluation of Outcomes: Not Progressing   RN Treatment Plan for Primary Diagnosis: MDD (major depressive disorder) Long Term Goal(s): Knowledge of disease and therapeutic regimen to maintain health will improve  Short Term Goals: Ability to remain free from injury will improve, Ability to verbalize frustration and anger appropriately will improve, Ability to demonstrate self-control, Ability to participate in decision  making will improve, Ability to verbalize feelings will improve, Ability to disclose and discuss suicidal ideas, Ability to identify and develop effective coping behaviors will improve, and Compliance with prescribed medications will improve  Medication Management: RN will administer medications as ordered by provider, will assess and evaluate patient's response and provide education to patient for prescribed medication. RN will report any adverse and/or side effects to prescribing provider.  Therapeutic Interventions: 1 on 1 counseling sessions, Psychoeducation, Medication administration, Evaluate responses to treatment, Monitor vital signs and CBGs as ordered, Perform/monitor CIWA, COWS, AIMS and Fall Risk screenings as ordered, Perform wound care treatments as ordered.  Evaluation of Outcomes: Not Progressing   LCSW Treatment Plan for Primary Diagnosis: MDD (major depressive disorder) Long Term Goal(s): Safe transition to appropriate next level of care at discharge, Engage patient in therapeutic group addressing interpersonal concerns.  Short Term Goals: Engage patient in aftercare planning with referrals and resources, Increase social support, Increase ability to appropriately verbalize feelings, Increase emotional regulation, Facilitate acceptance of mental health diagnosis and concerns, Facilitate patient progression through stages of change regarding substance use diagnoses and concerns, Identify triggers associated with mental health/substance abuse issues, and Increase skills for wellness and recovery  Therapeutic Interventions: Assess for all discharge needs, 1 to 1 time with Social worker, Explore available resources and support systems, Assess for adequacy in community support network, Educate family and significant other(s) on suicide prevention, Complete Psychosocial Assessment, Interpersonal group therapy.  Evaluation of Outcomes: Not Progressing   Progress in Treatment: Attending  groups: Yes. Participating in groups: Yes. Taking medication as prescribed: Yes. Toleration medication: Yes. Family/Significant other contact made: No, will contact:  Anthony Rowe (cousin) [phone] Patient understands diagnosis: Yes. Discussing patient identified problems/goals with staff: Yes. Medical problems stabilized or resolved: Yes. Denies suicidal/homicidal ideation: Yes. Issues/concerns per patient self-inventory: No.  New problem(s) identified:  No  New Short Term/Long Term Goal(s):    medication stabilization, elimination of SI thoughts, development of comprehensive mental wellness plan.    Patient Goals:  I want to get my depression and anger under control.  Discharge Plan or Barriers:  Patient recently admitted. CSW will continue to follow and assess for appropriate referrals  and possible discharge planning.   Reason for Continuation of Hospitalization: Anxiety Depression Medication stabilization Suicidal ideation  Estimated Length of Stay: 5 - 7 days  Last 3 [place] Suicide Severity Risk Score: Flowsheet Row [facility] (Current[facility] Most recent reading at 06/08/2024 12:34 AM ED from 06/07/2024 in [facility] Most recent reading at 06/07/2024  5:25 PM UC from 05/20/2024 in [facility] [employer] [facility]) Most recent reading at 05/20/2024  8:30 AM  C-SSRS RISK CATEGORY No Risk High Risk No Risk    Last PHQ 2/9 Scores:    09/25/2013   11:57 AM  Depression screen PHQ 2/9  Decreased Interest 1  Down, Depressed, Hopeless 1  PHQ - 2 Score 2  Altered sleeping 1  Tired, decreased energy 1  Change in appetite 0  Feeling bad or failure about yourself  0  Trouble concentrating 0  Moving slowly or fidgety/restless 0  Suicidal thoughts 0   PHQ-9 Score 4     Data saved with a previous flowsheet row definition    Scribe for Treatment Team: Ramla Hase O  Aroura Vasudevan, LCSWA 06/09/2024 3:49 PM

## 2024-06-09 NOTE — Progress Notes (Signed)
Pt did not attend goals group. 

## 2024-06-09 NOTE — Progress Notes (Signed)
   06/09/24 0100  Psych Admission Type (Psych Patients Only)  Admission Status Involuntary  Psychosocial Assessment  Patient Complaints Anxiety;Depression  Eye Contact Fair  Facial Expression Animated  Affect Anxious;Appropriate to circumstance  Speech Logical/coherent  Interaction Assertive  Motor Activity Other (Comment) (WDL)  Aggressive Behavior  Effect No apparent injury  Thought Process  Coherency Circumstantial  Content Blaming others  Delusions None reported or observed  Perception WDL  Hallucination None reported or observed  Judgment Poor  Confusion None  Danger to Self  Current suicidal ideation? Denies   (Sleep Hours) -9 (Any PRNs that were needed, meds refused, or side effects to meds)- Tylenol , and trazodone   (Any disturbances and when (visitation, over night)-none (Concerns raised by the patient)- Pt stated he is having left side back pain. Pt stated he was unsure if it was related to his chronic back pain,or if he could have a kidney stone forming.Pt stated he has had several kidney stones in the [place]. Pt had a complaint of constipation prn milk of magnesia administered. Pt educated on the importance of increasing water intake.  (SI/HI/AVH)- Pt endorses HI. Pt stated his HI is decreased significantly from the start of the day.

## 2024-06-09 NOTE — Group Note (Signed)
 Date:  06/09/2024 Time:  9:24 PM  Group Topic/Focus:  Wrap-Up Group:   The focus of this group is to help patients review their daily goal of treatment and discuss progress on daily workbooks.    Additional Comments:  Pt attended the AA meeting and participated.   Anthony Rowe 06/09/2024, 9:24 PM

## 2024-06-09 NOTE — Progress Notes (Signed)
 [facility] MD Progress Note  06/09/2024 3:40 PM Anthony Rowe  MRN:  [id number]  Reason for admission: 43 y.o. adult transgender male with sex re-assignment surgery completed. He presents to the [facility] today as a walk in, with complaints of worsening suicidal ideations in the context of psychosocial stressors & unresolved grief. While at the [facility], patient reports suicidal thoughts starting from when his wife passed away in 08-Feb-2024 of last year, 2024,and has been  worsening over the past 24 hours, after the father of his step-daughter came to the school with the Police Department & retrieved the child.   Patient denies any plan at this time to harm himself, but reports that he has an intent to do so.  He reports hx of suicide attempt in his childhood during which he choked himself. He reports homicidal ideations towards his step-Childrens' father but denies having a plan to harm him, but reports an intent to do so.   Daily notes: Anthony Rowe is seen this morning, chart reviewed. The chart findings has been discussed with the treatment team during the team meeting this afternoon. He presents alert, oriented & aware of situation. He is visible on the unit, attending group sessions. He presents with an improving affect, good eye contact & verbally responsive. He reports, I'm starting to feel better today. I feel more focused. I'm glad I came to this place because I have been trying to fight all my struggles & emotions on my own. I have listened to the providers, the SW & the nurses, everyone here has been encouraging & supportive. I have seen too that a lot other patient's in this place do not have  a place to go after discharge. Here I'm with my job, a place to live & cars to drive. I do have a lot of support system as well. I know now that it is going to be okay. I do have a son that has autism who needs me. I'm doing well on my medicines. There are no side effects to report. I have been attending group sessions. I'm  learning some coping skills. I slept well last night. Anthony Rowe currently denies any SIHI, AVH, delusional thoughts or paranoia. He does not appear to be responding to any internal stimuli. Patient is instructed & encouraged to continue to take his medication, attend & participate in the group sessions. Continue current plan of care as already in progress. There are no changes made.  Principal Problem: MDD (major depressive disorder)  Diagnosis: Principal Problem:   MDD (major depressive disorder)  Total Time spent with patient: 45 minutes  Past Psychiatric History: See H&P.  Past Medical History:  Past Medical History:  Diagnosis Date   Asthma    Bronchitis    COVID-19    Endometriosis    Hypertension    Migraine    Thyroid  disease    hyperthyroidism per pt report    Past Surgical History:  Procedure Laterality Date   ABDOMINAL HYSTERECTOMY     ANKLE SURGERY     MASTECTOMY Bilateral 2017   male to male    Family History:  Family History  Problem Relation Age of Onset   Cancer Mother        lung   Hypertension Maternal Grandmother    Diabetes Maternal Grandmother    Thyroid  disease Neg Hx    Family Psychiatric  History: See H&P.  Social History:  Social History   Substance and Sexual Activity  Alcohol Use Yes  Social History   Substance and Sexual Activity  Drug Use Yes   Types: Marijuana    Social History   Socioeconomic History   Marital status: Married    Spouse name: Not on file   Number of children: Not on file   Years of education: Not on file   Highest education level: Not on file  Occupational History   Not on file  Tobacco Use   Smoking status: Never   Smokeless tobacco: Never  Vaping Use   Vaping status: Never Used  Substance and Sexual Activity   Alcohol use: Yes   Drug use: Yes    Types: Marijuana   Sexual activity: Not Currently  Other Topics Concern   Not on file  Social History Narrative   Not on file   Social Drivers  of Health   Financial Resource Strain: Low Risk  (02/11/2024)   [facility] from [facility]   Overall Financial Resource Strain (CARDIA)    Difficulty of Paying Living Expenses: Not hard at all  Food Insecurity: No Food Insecurity (06/08/2024)   Hunger Vital Sign    Worried About Running Out of Food in the Last Year: Never true    Ran Out of Food in the Last Year: Never true  Transportation Needs: No Transportation Needs (06/08/2024)   PRAPARE - Administrator, Civil Service (Medical): No    Lack of Transportation (Non-Medical): No  Physical Activity: Not on file  Stress: Not on file  Social Connections: Patient Declined (06/08/2024)   Social Connection and Isolation Panel    Frequency of Communication with Friends and Family: Patient declined    Frequency of Social Gatherings with Friends and Family: Patient declined    Attends Religious Services: Patient declined    Database administrator or Organizations: Patient declined    Attends Engineer, structural: Patient declined    Marital Status: Patient declined   Additional Social History:   Sleep: Good Estimated Sleeping Duration (Last 24 Hours): 7.50-7.75 hours  Appetite:  Good  Current Medications: Current Facility-Administered Medications  Medication Dose Route Frequency Provider Last Rate Last Admin   acetaminophen  (TYLENOL ) tablet 650 mg  650 mg Oral Q6H PRN Trudy Carwin, NP   650 mg at 06/08/24 1957   albuterol  (VENTOLIN  HFA) 108 (90 Base) MCG/ACT inhaler 1-2 puff  1-2 puff Inhalation Q6H PRN Collene Gouge I, NP       alum & mag hydroxide-simeth (MAALOX/MYLANTA) 200-200-20 MG/5ML suspension 30 mL  30 mL Oral Q4H PRN Trudy Carwin, NP       [name] ON 06/10/2024] buPROPion  (WELLBUTRIN  XL) 24 hr tablet 150 mg  150 mg Oral Daily Kennyth Starleen RAMAN, MD       busPIRone  (BUSPAR ) tablet 10 mg  10 mg Oral BID Staci Carver I, NP   10 mg at 06/09/24 9049   cyanocobalamin  (VITAMIN B12) tablet 1,000 mcg  1,000 mcg Oral  Daily Dhruti Ghuman I, NP   1,000 mcg at 06/09/24 0950   haloperidol  (HALDOL ) tablet 5 mg  5 mg Oral TID PRN Trudy Carwin, NP       And   diphenhydrAMINE  (BENADRYL ) capsule 50 mg  50 mg Oral TID PRN Trudy Carwin, NP       fluticasone  (FLOVENT  HFA) 220 MCG/ACT inhaler 1 puff  1 puff Inhalation BID Collene Gouge I, NP   1 puff at 06/08/24 1955   gabapentin  (NEURONTIN ) capsule 100 mg  100 mg Oral QHS Collene Gouge FERNS,  NP   100 mg at 06/08/24 2100   levothyroxine  (SYNTHROID ) tablet 200 mcg  200 mcg Oral Daily Collene Gouge I, NP   200 mcg at 06/09/24 9367   magnesium  hydroxide (MILK OF MAGNESIA) suspension 30 mL  30 mL Oral Daily PRN Trudy Carwin, NP   30 mL at 06/08/24 1957   metoprolol  succinate (TOPROL -XL) 24 hr tablet 50 mg  50 mg Oral Daily Yesennia Hirota I, NP   50 mg at 06/09/24 9049   mirtazapine  (REMERON ) tablet 15 mg  15 mg Oral QHS Javaris Wigington I, NP   15 mg at 06/08/24 2101   naproxen  (NAPROSYN ) tablet 375 mg  375 mg Oral BID PRN Collene Gouge I, NP       OLANZapine  (ZYPREXA ) injection 10 mg  10 mg Intramuscular TID PRN Trudy Carwin, NP       OLANZapine  (ZYPREXA ) injection 5 mg  5 mg Intramuscular TID PRN Trudy Carwin, NP       [name] ON 06/21/2024] Testosterone  Cypionate SOLN 100 mg  100 mg Injection Q14 Days Gaelyn Tukes I, NP       traZODone  (DESYREL ) tablet 50 mg  50 mg Oral QHS PRN Collene Gouge I, NP   50 mg at 06/08/24 2102   Lab Results:  Results for orders placed or performed during the hospital encounter of 06/07/24 (from the past 48 hours)  Folate     Status: None   Collection Time: 06/08/24  6:43 PM  Result Value Ref Range   Folate 8.8 >5.9 ng/mL    Comment: Performed at [facility], [address] [place], [place] [address]  RPR     Status: None   Collection Time: 06/08/24  6:43 PM  Result Value Ref Range   RPR Ser Ql NON REACTIVE NON REACTIVE    Comment: Performed at [facility], [address], [place], [place] [address]  TSH     Status:  Abnormal   Collection Time: 06/08/24  6:43 PM  Result Value Ref Range   TSH <0.010 (L) 0.350 - 4.500 uIU/mL    Comment: Performed by a 3rd Generation assay with a functional sensitivity of <=0.01 uIU/mL. Performed at [facility], [address] [place], [place] [address]   Vitamin B12     Status: None   Collection Time: 06/08/24  6:43 PM  Result Value Ref Range   Vitamin B-12 896 180 - 914 pg/mL    Comment: (NOTE) This assay is not validated for testing neonatal or myeloproliferative syndrome specimens for Vitamin B12 levels. Performed at [facility], [address], [place], [place] [address]   VITAMIN D  25 Hydroxy (Vit-D Deficiency, Fractures)     Status: None   Collection Time: 06/08/24  6:43 PM  Result Value Ref Range   Vit D, 25-Hydroxy 30.45 30 - 100 ng/mL    Comment: (NOTE) Vitamin D  deficiency has been defined by the Institute of Medicine  and an Endocrine Society practice guideline as a level of serum 25-OH  vitamin D  less than 20 ng/mL (1,2). The Endocrine Society went on to  further define vitamin D  insufficiency as a level between 21 and 29  ng/mL (2).  1. IOM (Institute of Medicine). 2010. Dietary reference intakes for  calcium and D. [place]: [employer]. 2. Holick MF, Binkley Gilberton, Bischoff-Ferrari HA, et al. Evaluation,  treatment, and prevention of vitamin D  deficiency: an Endocrine  Society clinical practice guideline, JCEM. 2011  Jul; 96(7): 1911-30.  Performed at [facility], [address], [place], [place] [address]   T4, free     Status: Abnormal   Collection Time: 06/08/24  6:43 PM  Result Value Ref Range   Free T4 1.14 (H) 0.61 - 1.12 ng/dL    Comment: (NOTE) Biotin ingestion may interfere with free T4 tests. If the results are inconsistent with the TSH level, previous test results, or the clinical presentation, then consider biotin interference. If needed, order repeat testing  after stopping biotin. Performed at [facility], [address], [place], [place] [address]   HIV Antibody (routine testing w rflx)     Status: None   Collection Time: 06/08/24  6:43 PM  Result Value Ref Range   HIV Screen 4th Generation wRfx Non Reactive Non Reactive    Comment: Performed at [facility], [address], [place], [place] [address]   Blood Alcohol level:  Lab Results  Component Value Date   ETH 16 (H) 06/07/2024   Metabolic Disorder Labs: Lab Results  Component Value Date   HGBA1C 5.6 06/07/2024   MPG 114 06/07/2024   No results found for: PROLACTIN Lab Results  Component Value Date   CHOL 200 06/07/2024   TRIG 162 (H) 06/07/2024   HDL 63 06/07/2024   CHOLHDL 3.2 06/07/2024   VLDL 32 06/07/2024   LDLCALC 105 (H) 06/07/2024   LDLCALC 106 (H) 05/03/2023   Physical Findings: AIMS:  ,  ,  ,  ,  ,  ,   CIWA:    COWS:     Musculoskeletal: Strength & Muscle Tone: within normal limits Gait & Station: normal Patient leans: N/A  Psychiatric Specialty Exam:  Presentation  General Appearance:  Casual; Fairly Groomed  Eye Contact: Good  Speech: Clear and Coherent; Normal Rate  Speech Volume: Normal  Handedness: Right   Mood and Affect  Mood: Anxious; Depressed  Affect: Congruent; Depressed; Flat  Thought Process  Thought Processes: Coherent  Descriptions of Associations:Intact  Orientation:Full (Time, Place and Person)  Thought Content:Logical  History of Schizophrenia/Schizoaffective disorder:No data recorded Duration of Psychotic Symptoms:No data recorded Hallucinations:Hallucinations: None; Auditory Description of Auditory Hallucinations: I hear my wife's voice sometimes.  Ideas of Reference:None  Suicidal Thoughts:Suicidal Thoughts: No  Homicidal Thoughts:Homicidal Thoughts: Yes, Passive HI Active Intent and/or Plan: Without Means to Carry Out HI Passive Intent and/or Plan: Without Intent; Without Means  to Carry Out; Without Access to Means; Without Plan  Sensorium  Memory: Immediate Good; Recent Good; Remote Good  Judgment: Fair  Insight: Fair  Chartered certified accountant: Fair  Attention Span: Fair  Recall: Metta Abe of Knowledge: Fair  Language: Good  Psychomotor Activity  Psychomotor Activity: Psychomotor Activity: Normal  Assets  Assets: Communication Skills; Desire for Improvement; Financial Resources/Insurance; Housing; Resilience  Sleep  Sleep: Sleep: Fair Number of Hours of Sleep: 9  Physical Exam: Physical Exam Vitals and nursing note reviewed.  HENT:     Head: Normocephalic.     Nose: Nose normal.     Mouth/Throat:     Pharynx: Oropharynx is clear.  Cardiovascular:     Comments: Elevated blood pressure: 144/93. Pulmonary:     Effort: Pulmonary effort is normal.  Genitourinary:    Comments: Deferred. Musculoskeletal:        General: Normal range of motion.     Cervical back: Normal range of motion.  Neurological:     General: No focal deficit present.  Mental Status: He is alert and oriented to person, place, and time.    Review of Systems  Constitutional:  Negative for chills, diaphoresis and fever.  HENT:  Negative for congestion and sore throat.   Eyes:  Negative for blurred vision.  Respiratory:  Negative for cough, shortness of breath and wheezing.   Cardiovascular:  Negative for chest pain and palpitations.  Gastrointestinal:  Negative for abdominal pain, constipation, diarrhea, heartburn, nausea and vomiting.  Genitourinary:  Negative for dysuria.  Musculoskeletal:  Negative for joint pain and myalgias.  Neurological:  Negative for dizziness, tingling, tremors, sensory change, speech change, focal weakness, seizures, loss of consciousness, weakness and headaches.  Endo/Heme/Allergies:        Reviewed allergy lists.  Psychiatric/Behavioral:  Positive for depression.    Blood pressure (!) 144/93, pulse (!) 53,  temperature 98.4 F (36.9 C), temperature source Oral, resp. rate 20, height 5' 3 (1.6 m), weight 70.9 kg, SpO2 100%. Body mass index is 27.67 kg/m.  Treatment Plan Summary: Daily contact with patient to assess and evaluate symptoms and progress in treatment and Medication management.   Principal/active diagnoses.  MDD (major depressive disorder).  Gender Dysphoria.    Medical:  Hypothyroidism. HTN.   Plan: The risks/benefits/side-effects/alternatives to the medications in use were discussed in detail with the patient and time was given for patient's questions. The patient consents to medication trial.    Resumed medications:  -Continue Buspar  10 mg po bid for anxiety.  -Continue Wellbutrin  XL 150 mg po daily for depression.  -Continue hydroxyzine  25 mg po tid prn for anxiety.  -Completed hydroxyzine  50 mg po once for anxiety.  -Continue Mirtazapine  15 mg po Q hs for depression/insomnia.  - Continue Trazodone  50 mg po Q hs prn for insomnia.  -Continue gabapentin  100 mg po daily at bedtime.   Agitation protocols.  -Continue as recommended (see MAR).   Medical.  -Continue Albuterol  inhaler 1-2 puffs Q 6 hrs prn for SOB.  -Continue Vitamin B12 1,000 mg po daily for supplementation.  -Continue Flovent  inhaler 1 puff bid for SOB.  -Continue Testosterone  soln 100 mg by injection every 14 days.   Other PRNS -Continue Tylenol  650 mg every 6 hours PRN for mild pain -Continue Maalox 30 ml Q 4 hrs PRN for indigestion -Continue MOM 30 ml po Q 6 hrs for constipation   Safety and Monitoring: Voluntary admission to inpatient psychiatric unit for safety, stabilization and treatment Daily contact with patient to assess and evaluate symptoms and progress in treatment Patient's case to be discussed in multi-disciplinary team meeting Observation Level : q15 minute checks Vital signs: q12 hours Precautions: Safety   Discharge Planning: Social work and case management to assist with  discharge planning and identification of hospital follow-up needs prior to discharge Estimated LOS: 5-7 days Discharge Concerns: Need to establish a safety plan; Medication compliance and effectiveness Discharge Goals: Return home with outpatient referrals for mental health follow-up including medication management/psychotherapy  Mac Bolster, NP, pmhnp, fnp-bc. 06/09/2024, 3:40 PM

## 2024-06-09 NOTE — Care Management Important Message (Signed)
 Medicare IM printed and given to social work to give to the patient. ?

## 2024-06-09 NOTE — Progress Notes (Signed)
   06/09/24 2148  Psych Admission Type (Psych Patients Only)  Admission Status Involuntary  Psychosocial Assessment  Patient Complaints Anxiety  Eye Contact Brief  Facial Expression Animated  Affect Appropriate to circumstance  Speech Logical/coherent  Interaction Assertive  Motor Activity Other (Comment) (WDL)  Appearance/Hygiene Unremarkable  Behavior Characteristics Appropriate to situation  Mood Anxious;Pleasant  Thought Process  Coherency WDL  Content WDL  Delusions None reported or observed  Perception WDL  Hallucination None reported or observed  Judgment Impaired  Confusion None  Danger to Self  Current suicidal ideation? Denies  Self-Injurious Behavior No self-injurious ideation or behavior indicators observed or expressed   Agreement Not to Harm Self Yes  Description of Agreement verbal  Danger to Others  Danger to Others None reported or observed

## 2024-06-10 MED ORDER — HYDROXYZINE HCL 25 MG PO TABS
25.0000 mg | ORAL_TABLET | Freq: Three times a day (TID) | ORAL | Status: DC | PRN
  Administered 2024-06-10 – 2024-06-11 (×4): 25 mg via ORAL
  Filled 2024-06-10 (×5): qty 1

## 2024-06-10 NOTE — Progress Notes (Signed)
(  Sleep Hours) - 6.75 hours (Any PRNs that were needed, meds refused, or side effects to meds)-  None (Any disturbances and when (visitation, over night)- None (Concerns raised by the patient)-  None (SI/HI/AVH)-  Denies

## 2024-06-10 NOTE — Group Note (Signed)
 Date:  06/10/2024 Time:  9:53 AM  Group Topic/Focus:  Goals Group:   The focus of this group is to help patients establish daily goals to achieve during treatment and discuss how the patient can incorporate goal setting into their daily lives to aide in recovery.    Participation Level:  Active  Participation Quality:  Appropriate  Affect:  Appropriate  Cognitive:  Alert and Appropriate  Insight: Appropriate  Engagement in Group:  Engaged  Modes of Intervention:  Discussion  Additional Comments: Pt goal is to stay in a good mood and to stay positive. Pt coping skill take my time to breath and remember that he come first.  [facility] LOISE Dawn 06/10/2024, 9:53 AM

## 2024-06-10 NOTE — Group Note (Deleted)
 Date:  06/10/2024 Time:  9:26 PM  Group Topic/Focus:  Recovery Goals:   The focus of this group is to identify appropriate goals for recovery and establish a plan to achieve them.     Participation Level:  {BHH PARTICIPATION OZCZO:77735}  Participation Quality:  {BHH PARTICIPATION QUALITY:22265}  Affect:  {BHH AFFECT:22266}  Cognitive:  {BHH COGNITIVE:22267}  Insight: {BHH Insight2:20797}  Engagement in Group:  {BHH ENGAGEMENT IN HMNLE:77731}  Modes of Intervention:  {BHH MODES OF INTERVENTION:22269}  Additional Comments:  ***  Lacorey Brusca L 06/10/2024, 9:26 PM

## 2024-06-10 NOTE — Plan of Care (Signed)
   Problem: Education: Goal: Mental status will improve Outcome: Progressing   Problem: Activity: Goal: Interest or engagement in activities will improve Outcome: Progressing

## 2024-06-10 NOTE — Group Note (Signed)
 LCSW Group Therapy Note  @TD @ 1:15pm  Type of Therapy and Topic:  Group Therapy - Safety  Participation Level:  Active   Description of Group This process group involved patients discussing the situations or people in their lives that frequently make them safe or unsafe.  Anxiety was a common factor among all group participants and many of them described home situations that keep them on edge and not able to feel completely safe.  Three questions were addressed during the group:  (1) What makes you feel safe (or unsafe)?  (2) Do you feel safe with yourself and why?  (3) If you don't feel safe, what can you do?  A lengthy discussion ensued in which group members empathized with each other, gave suggestions to one another, and expressed their feelings freely.  Therapeutic Goals Patient will describe what makes them feel safe or unsafe in their everyday lives. Patient will think about and discuss whether they feel safe with themselves and what reasons might contribute to feeling safe or unsafe. Patients will participate in planning for what can be done to help themselves feel safer.   Summary of Patient Progress:  The patient share that he recognized an unsafe environment and he recognized a safe environment. The patient share that he is from [place]  and he say that he know that he is safe and when he is unsafe.    Therapeutic Modalities Cognitive Behavioral Therapy  Anthony [facility]e O [name], LCSWA 06/10/2024  3:34 PM

## 2024-06-10 NOTE — Group Note (Signed)
 Date:  06/10/2024 Time:  5:50 PM  Group Topic/Focus:  Wellness Toolbox:   The focus of this group is to introduce the topic of wellness and using collage as a creative outlet for expressing emotions, reducing anxiety, fostering group cohesion, and enabling personal insight and healing.   Participation Level:  Active  Participation Quality:  Appropriate and Attentive  Affect:  Appropriate  Cognitive:  Alert and Appropriate  Insight: Appropriate and Good  Engagement in Group:  Engaged  Modes of Intervention:  Activity, Discussion, Education, Exploration, Rapport Building, Socialization, and Support    Anthony Rowe 06/10/2024, 5:50 PM

## 2024-06-10 NOTE — Progress Notes (Signed)
 [facility] MD Progress Note  06/10/2024 11:59 AM Anthony Rowe  MRN:  [id number]  Patient is a  y.o. 43 yo transgender male who presents to the [facility] unit due to suicidal ideations.   Interval History Patient was seen today for re-evaluation.  Nursing reports no events overnight. The patient has no issues with performing ADLs.  Patient has been medication compliant.    Patient was seen and interviewed by attending psychiatrist. Chart reviewed. Patient discussed during treatment team rounds.  Subjective:  On assessment patient reports feeling better. Patient is visible on the unit and attending group sessions. Affect is improving with good eye contact and verbal engagement. He reports feeling better, more focused, and expresses appreciation for the support from staff. He acknowledges having a stable support system, employment, housing, and transportation, and identifies his autistic son as a Engineering geologist. He reports good mood, sleep, and appetite. Less depressed, less anxiety. No panic attacks. Denies suicidal or homicidal ideation, hallucinations, paranoia, or delusional thinking. He denies any medication side effects or physical complaints. Patient is encouraged to continue medications and group participation. He thinks he will be ready for discharge on Monday.   Labs: no new results for review.   Principal Problem: MDD (major depressive disorder) Diagnosis: Principal Problem:   MDD (major depressive disorder)  Total Time spent with patient: 30 minutes  Past Psychiatric History: see h&p   Past Medical History:  Past Medical History:  Diagnosis Date   Asthma    Bronchitis    COVID-19    Endometriosis    Hypertension    Migraine    Thyroid  disease    hyperthyroidism per pt report    Past Surgical History:  Procedure Laterality Date   ABDOMINAL HYSTERECTOMY     ANKLE SURGERY     MASTECTOMY Bilateral 2017   male to male    Family History:  Family History  Problem Relation  Age of Onset   Cancer Mother        lung   Hypertension Maternal Grandmother    Diabetes Maternal Grandmother    Thyroid  disease Neg Hx    Family Psychiatric  History: see H&P  Social History:  Social History   Substance and Sexual Activity  Alcohol Use Yes     Social History   Substance and Sexual Activity  Drug Use Yes   Types: Marijuana    Social History   Socioeconomic History   Marital status: Married    Spouse name: Not on file   Number of children: Not on file   Years of education: Not on file   Highest education level: Not on file  Occupational History   Not on file  Tobacco Use   Smoking status: Never   Smokeless tobacco: Never  Vaping Use   Vaping status: Never Used  Substance and Sexual Activity   Alcohol use: Yes   Drug use: Yes    Types: Marijuana   Sexual activity: Not Currently  Other Topics Concern   Not on file  Social History Narrative   Not on file   Social Drivers of Health   Financial Resource Strain: Low Risk  (02/11/2024)   Received from [facility]   Overall Financial Resource Strain (CARDIA)    Difficulty of Paying Living Expenses: Not hard at all  Food Insecurity: No Food Insecurity (06/08/2024)   Hunger Vital Sign    Worried About Running Out of Food in the Last Year: Never true    Ran Out of  Food in the Last Year: Never true  Transportation Needs: No Transportation Needs (06/08/2024)   PRAPARE - Administrator, Civil Service (Medical): No    Lack of Transportation (Non-Medical): No  Physical Activity: Not on file  Stress: Not on file  Social Connections: Patient Declined (06/08/2024)   Social Connection and Isolation Panel    Frequency of Communication with Friends and Family: Patient declined    Frequency of Social Gatherings with Friends and Family: Patient declined    Attends Religious Services: Patient declined    Database administrator or Organizations: Patient declined    Attends Hospital doctor: Patient declined    Marital Status: Patient declined   Additional Social History:                         Sleep: Good Estimated Sleeping Duration (Last 24 Hours): 5.25-6.25 hours  Appetite:  Good  Current Medications: Current Facility-Administered Medications  Medication Dose Route Frequency Provider Last Rate Last Admin   acetaminophen  (TYLENOL ) tablet 650 mg  650 mg Oral Q6H PRN Trudy Carwin, NP   650 mg at 06/08/24 1957   albuterol  (VENTOLIN  HFA) 108 (90 Base) MCG/ACT inhaler 1-2 puff  1-2 puff Inhalation Q6H PRN Collene Gouge I, NP   2 puff at 06/10/24 1127   alum & mag hydroxide-simeth (MAALOX/MYLANTA) 200-200-20 MG/5ML suspension 30 mL  30 mL Oral Q4H PRN Trudy Carwin, NP       buPROPion  (WELLBUTRIN  XL) 24 hr tablet 150 mg  150 mg Oral Daily Parker, Alvin S, MD   150 mg at 06/10/24 9056   busPIRone  (BUSPAR ) tablet 10 mg  10 mg Oral BID Nwoko, Agnes I, NP   10 mg at 06/10/24 0848   cyanocobalamin  (VITAMIN B12) tablet 1,000 mcg  1,000 mcg Oral Daily Nwoko, Agnes I, NP   1,000 mcg at 06/10/24 0848   haloperidol  (HALDOL ) tablet 5 mg  5 mg Oral TID PRN Trudy Carwin, NP       And   diphenhydrAMINE  (BENADRYL ) capsule 50 mg  50 mg Oral TID PRN Trudy Carwin, NP       fluticasone  (FLOVENT  HFA) 220 MCG/ACT inhaler 1 puff  1 puff Inhalation BID Collene Gouge I, NP   1 puff at 06/10/24 0850   gabapentin  (NEURONTIN ) capsule 100 mg  100 mg Oral QHS Collene Gouge I, NP   100 mg at 06/09/24 2124   hydrOXYzine  (ATARAX ) tablet 25 mg  25 mg Oral TID PRN Christianna Belmonte, MD   25 mg at 06/10/24 0943   levothyroxine  (SYNTHROID ) tablet 200 mcg  200 mcg Oral Daily Nwoko, Agnes I, NP   200 mcg at 06/10/24 9372   magnesium  hydroxide (MILK OF MAGNESIA) suspension 30 mL  30 mL Oral Daily PRN Trudy Carwin, NP   30 mL at 06/08/24 1957   metoprolol  succinate (TOPROL -XL) 24 hr tablet 50 mg  50 mg Oral Daily Nwoko, Agnes I, NP   50 mg at 06/10/24 0848   mirtazapine  (REMERON ) tablet 15 mg  15 mg  Oral QHS Nwoko, Agnes I, NP   15 mg at 06/09/24 2124   naproxen  (NAPROSYN ) tablet 375 mg  375 mg Oral BID PRN Collene Gouge I, NP       OLANZapine  (ZYPREXA ) injection 10 mg  10 mg Intramuscular TID PRN Trudy Carwin, NP       OLANZapine  (ZYPREXA ) injection 5 mg  5 mg Intramuscular TID PRN Trudy Carwin,  NP       [START ON 06/21/2024] Testosterone  Cypionate SOLN 100 mg  100 mg Injection Q14 Days Nwoko, Agnes I, NP       traZODone  (DESYREL ) tablet 50 mg  50 mg Oral QHS PRN Collene Gouge I, NP   50 mg at 06/08/24 2102    Lab Results:  Results for orders placed or performed during the hospital encounter of 06/07/24 (from the past 48 hours)  Folate     Status: None   Collection Time: 06/08/24  6:43 PM  Result Value Ref Range   Folate 8.8 >5.9 ng/mL    Comment: Performed at [facility], [address] [place], [place] [address]  RPR     Status: None   Collection Time: 06/08/24  6:43 PM  Result Value Ref Range   RPR Ser Ql NON REACTIVE NON REACTIVE    Comment: Performed at [facility], [address], [place], [place] [address]  TSH     Status: Abnormal   Collection Time: 06/08/24  6:43 PM  Result Value Ref Range   TSH <0.010 (L) 0.350 - 4.500 uIU/mL    Comment: Performed by a 3rd Generation assay with a functional sensitivity of <=0.01 uIU/mL. Performed at [facility], [address] [place], [place] [address]   Vitamin B12     Status: None   Collection Time: 06/08/24  6:43 PM  Result Value Ref Range   Vitamin B-12 896 180 - 914 pg/mL    Comment: (NOTE) This assay is not validated for testing neonatal or myeloproliferative syndrome specimens for Vitamin B12 levels. Performed at [facility], [address], [place], [place] [address]   VITAMIN D  25 Hydroxy (Vit-D Deficiency, Fractures)     Status: None   Collection Time: 06/08/24  6:43 PM  Result Value Ref Range   Vit D, 25-Hydroxy 30.45 30 - 100 ng/mL    Comment:  (NOTE) Vitamin D  deficiency has been defined by the Institute of Medicine  and an Endocrine Society practice guideline as a level of serum 25-OH  vitamin D  less than 20 ng/mL (1,2). The Endocrine Society went on to  further define vitamin D  insufficiency as a level between 21 and 29  ng/mL (2).  1. IOM (Institute of Medicine). 2010. Dietary reference intakes for  calcium and D. [place]: [employer]. 2. Holick MF, Binkley Ridott, Bischoff-Ferrari HA, et al. Evaluation,  treatment, and prevention of vitamin D  deficiency: an Endocrine  Society clinical practice guideline, JCEM. 2011 Jul; 96(7): 1911-30.  Performed at [facility], [address], [place], [place] [address]   T4, free     Status: Abnormal   Collection Time: 06/08/24  6:43 PM  Result Value Ref Range   Free T4 1.14 (H) 0.61 - 1.12 ng/dL    Comment: (NOTE) Biotin ingestion may interfere with free T4 tests. If the results are inconsistent with the TSH level, previous test results, or the clinical presentation, then consider biotin interference. If needed, order repeat testing after stopping biotin. Performed at [facility], [address] [place], [place] [address]   HIV Antibody (routine testing w rflx)     Status: None   Collection Time: 06/08/24  6:43 PM  Result Value Ref Range   HIV Screen 4th Generation wRfx Non Reactive Non Reactive    Comment: Performed at [facility], [address] [place], [place] [address]  Blood Alcohol level:  Lab Results  Component Value Date   ETH 16 (H) 06/07/2024    Metabolic Disorder Labs: Lab Results  Component Value Date   HGBA1C 5.6 06/07/2024   MPG 114 06/07/2024   No results found for: PROLACTIN Lab Results  Component Value Date   CHOL 200 06/07/2024   TRIG 162 (H) 06/07/2024   HDL 63 06/07/2024   CHOLHDL 3.2 06/07/2024   VLDL 32 06/07/2024   LDLCALC 105 (H) 06/07/2024   LDLCALC 106 (H) 05/03/2023    Physical  Findings: AIMS:  ,  ,  ,  ,  ,  ,   CIWA:    COWS:     Musculoskeletal: Strength & Muscle Tone: within normal limits Gait & Station: normal Patient leans: N/A  Psychiatric Specialty Exam:  Presentation  General Appearance:  Casual; Fairly Groomed  Eye Contact: Good  Speech: Clear and Coherent; Normal Rate  Speech Volume: Normal  Handedness: Right   Mood and Affect  Mood: Anxious; Depressed  Affect: Congruent; Depressed; Flat   Thought Process  Thought Processes: Coherent  Descriptions of Associations:Intact  Orientation:Full (Time, Place and Person)  Thought Content:Logical  History of Schizophrenia/Schizoaffective disorder:No data recorded Duration of Psychotic Symptoms:No data recorded Hallucinations:No data recorded Ideas of Reference:None  Suicidal Thoughts:No data recorded Homicidal Thoughts:No data recorded  Sensorium  Memory: Immediate Good; Recent Good; Remote Good  Judgment: Fair  Insight: Fair   Art therapist  Concentration: Fair  Attention Span: Fair  Recall: Good  Fund of Knowledge: Fair  Language: Good   Psychomotor Activity  Psychomotor Activity:No data recorded  Assets  Assets: Communication Skills; Desire for Improvement; Financial Resources/Insurance; Housing; Resilience   Sleep  Sleep:No data recorded   Physical Exam: Physical Exam ROS Blood pressure (!) 141/97, pulse 61, temperature 97.9 F (36.6 C), temperature source Oral, resp. rate 14, height 5' 3 (1.6 m), weight 70.9 kg, SpO2 100%. Body mass index is 27.67 kg/m.   Treatment Plan Summary: Daily contact with patient to assess and evaluate symptoms and progress in treatment and Medication management  Patient is a 43 year old transgender male with the above-stated past psychiatric history who is seen in follow-up.  Chart reviewed. Patient discussed with nursing. Patient presents with improved mood, affect, and engagement, actively  participating in group sessions and reporting good sleep, appetite, and focus. He denies depression, anxiety, suicidal or homicidal ideation, hallucinations, paranoia, or delusions. He expresses gratitude for the support received, identifies strong social supports, and reports no medication side effects or physical complaints. Given his stability, insight, and absence of acute psychiatric symptoms, patient will likely be ready for discharge on Monday.   Diagnoses/ Active problems: -MDD, recurrent, severe, w/o psychosis.   PLAN:  Safety and Monitoring: continue inpatient psych admission; 15-minute checks; daily contact with patient to assess and evaluate symptoms and progress in treatment; psychoeducation.Vital signs: q12 hours. Precautions: suicide, elopement, and assault. Placed on room lock out for meals, snacks and groups.  Medications: Psych: -Continue Buspar  10 mg po bid for anxiety.  -Continue Wellbutrin  XL 150 mg po daily for depression.  -Continue hydroxyzine  25 mg po tid prn for anxiety.  -Continue Mirtazapine  15 mg po Q hs for depression/insomnia.  - Continue Trazodone  50 mg po Q hs prn for insomnia.  -Continue gabapentin  100 mg po daily at bedtime.   Agitation protocols.  -Continue as recommended (see MAR).   Medical.  -Continue Albuterol  inhaler 1-2 puffs Q 6 hrs prn for SOB.  -Continue Vitamin B12 1,000  mg po daily for supplementation.  -Continue Flovent  inhaler 1 puff bid for SOB.  -Continue Testosterone  soln 100 mg by injection every 14 days.   Other PRNS -Continue Tylenol  650 mg every 6 hours PRN for mild pain -Continue Maalox 30 ml Q 4 hrs PRN for indigestion -Continue MOM 30 ml po Q 6 hrs for constipation   Pertinent Labs: no new labs ordered today  Consults: No new consults placed since yesterday    Discharge Planning: -Social work and case management to assist with discharge planning and identification of hospital follow-up needs prior to  discharge -Estimated LOS: 5 days -Discharge Concerns: Need to establish a safety plan; Medication compliance and effectiveness -Discharge Goals: Return home with outpatient referrals for mental health follow-up including medication management/psychotherapy  Total Time Spent in Direct Patient Care:  I personally spent 35 minutes on the unit in direct patient care. The direct patient care time included face-to-face time with the patient, reviewing the patient's chart, communicating with other professionals, and coordinating care. Greater than 50% of this time was spent in counseling or coordinating care with the patient regarding goals of hospitalization, psycho-education, and discharge planning needs.   Neil Appl, MD 06/10/2024, 11:59 AM

## 2024-06-10 NOTE — Progress Notes (Signed)
   06/10/24 2327  Psych Admission Type (Psych Patients Only)  Admission Status Involuntary  Psychosocial Assessment  Patient Complaints Sleep disturbance  Eye Contact Fair  Facial Expression Animated  Affect Appropriate to circumstance  Speech Logical/coherent  Interaction Assertive  Motor Activity Other (Comment) (WDL)  Appearance/Hygiene Unremarkable  Behavior Characteristics Appropriate to situation  Mood Anxious;Pleasant  Thought Process  Coherency WDL  Content WDL  Delusions None reported or observed  Perception WDL  Hallucination None reported or observed  Judgment WDL  Confusion None  Danger to Self  Current suicidal ideation? Denies  Agreement Not to Harm Self Yes  Description of Agreement verbal  Danger to Others  Danger to Others None reported or observed

## 2024-06-10 NOTE — Group Note (Signed)
 Date:  06/10/2024 Time:  9:07 PM  Group Topic/Focus:  Wrap-Up Group:   The focus of this group is to help patients review their daily goal of treatment and discuss progress on daily workbooks.    Participation Level:  Active  Participation Quality:  Appropriate  Affect:  Appropriate  Cognitive:  Appropriate  Insight: Appropriate  Engagement in Group:  Engaged  Modes of Intervention:  Education  Additional Comments:  Pt attended and participated in group. Rated day 10/10.  Anthony Rowe 06/10/2024, 9:07 PM

## 2024-06-11 DIAGNOSIS — F332 Major depressive disorder, recurrent severe without psychotic features: Principal | ICD-10-CM

## 2024-06-11 LAB — T3: T3, Total: 105 ng/dL (ref 71–180)

## 2024-06-11 MED ORDER — TRAMADOL HCL 50 MG PO TABS
50.0000 mg | ORAL_TABLET | Freq: Once | ORAL | Status: AC
  Administered 2024-06-11: 50 mg via ORAL
  Filled 2024-06-11: qty 1

## 2024-06-11 NOTE — [facility] Suicide Risk Assessment (Signed)
 [facility] INPATIENT:  Family/Significant Other Suicide Prevention Education  Suicide Prevention Education:  Contact Attempts: Gordy Louder, has been identified by the patient as the family member/significant other with whom the patient will be residing, and identified as the person(s) who will aid the patient in the event of a mental health crisis.  With written consent from the patient, two attempts were made to provide suicide prevention education, prior to and/or following the patient's discharge.  We were unsuccessful in providing suicide prevention education.  A suicide education pamphlet was given to the patient to share with family/significant other.  Date and time of first attempt:06/11/24 at 3:20 PM  Date and time of second attempt: Second attempt is needed.   Veta Dambrosia M Devin Ganaway 06/11/2024, 3:20 PM

## 2024-06-11 NOTE — Progress Notes (Signed)
 [facility] MD Progress Note  06/11/2024 1:21 PM Leib Keys  MRN:  [id number]  Patient is a  y.o. 43 yo transgender male who presents to the [facility] unit due to suicidal ideations.   Interval History Patient was seen today for re-evaluation.  Nursing reports no events overnight. The patient has no issues with performing ADLs.  Patient has been medication compliant.    Patient was seen and interviewed by attending psychiatrist. Chart reviewed. Patient discussed during treatment team rounds.  Subjective:  On assessment patient reports my mood is good, my pain is not. Patient is visible on the unit and attending group sessions. He was observed spending a lot of time in the community room, playing with peers and laughing. He reports good mood, sleep, and appetite. Less depressed, less anxiety. No panic attacks. Denies suicidal or homicidal ideation, hallucinations, paranoia, or delusional thinking. He denies any medication side effects. His physical complaint today - worsened back pain I had it since admission, it was okay with Tylenol , but it worse today. Patient is encouraged to continue medications and group participation. He will likely be ready for discharge tomorrow.  Labs: no new results for review.   Principal Problem: MDD (major depressive disorder) Diagnosis: Principal Problem:   MDD (major depressive disorder)  Total Time spent with patient: 30 minutes  Past Psychiatric History: see h&p   Past Medical History:  Past Medical History:  Diagnosis Date   Asthma    Bronchitis    COVID-19    Endometriosis    Hypertension    Migraine    Thyroid  disease    hyperthyroidism per pt report    Past Surgical History:  Procedure Laterality Date   ABDOMINAL HYSTERECTOMY     ANKLE SURGERY     MASTECTOMY Bilateral 2017   male to male    Family History:  Family History  Problem Relation Age of Onset   Cancer Mother        lung   Hypertension Maternal Grandmother    Diabetes  Maternal Grandmother    Thyroid  disease Neg Hx    Family Psychiatric  History: see H&P  Social History:  Social History   Substance and Sexual Activity  Alcohol Use Yes     Social History   Substance and Sexual Activity  Drug Use Yes   Types: Marijuana    Social History   Socioeconomic History   Marital status: Married    Spouse name: Not on file   Number of children: Not on file   Years of education: Not on file   Highest education level: Not on file  Occupational History   Not on file  Tobacco Use   Smoking status: Never   Smokeless tobacco: Never  Vaping Use   Vaping status: Never Used  Substance and Sexual Activity   Alcohol use: Yes   Drug use: Yes    Types: Marijuana   Sexual activity: Not Currently  Other Topics Concern   Not on file  Social History Narrative   Not on file   Social Drivers of Health   Financial Resource Strain: Low Risk  (02/11/2024)   Received from [facility]   Overall Financial Resource Strain (CARDIA)    Difficulty of Paying Living Expenses: Not hard at all  Food Insecurity: No Food Insecurity (06/08/2024)   Hunger Vital Sign    Worried About Running Out of Food in the Last Year: Never true    Ran Out of Food in the Last Year:  Never true  Transportation Needs: No Transportation Needs (06/08/2024)   PRAPARE - Administrator, Civil Service (Medical): No    Lack of Transportation (Non-Medical): No  Physical Activity: Not on file  Stress: Not on file  Social Connections: Patient Declined (06/08/2024)   Social Connection and Isolation Panel    Frequency of Communication with Friends and Family: Patient declined    Frequency of Social Gatherings with Friends and Family: Patient declined    Attends Religious Services: Patient declined    Database administrator or Organizations: Patient declined    Attends Engineer, structural: Patient declined    Marital Status: Patient declined   Additional Social History:                          Sleep: Good Estimated Sleeping Duration (Last 24 Hours): 8.50-10.25 hours  Appetite:  Good  Current Medications: Current Facility-Administered Medications  Medication Dose Route Frequency Provider Last Rate Last Admin   acetaminophen  (TYLENOL ) tablet 650 mg  650 mg Oral Q6H PRN Trudy Carwin, NP   650 mg at 06/08/24 1957   albuterol  (VENTOLIN  HFA) 108 (90 Base) MCG/ACT inhaler 1-2 puff  1-2 puff Inhalation Q6H PRN Collene Gouge I, NP   2 puff at 06/10/24 1127   alum & mag hydroxide-simeth (MAALOX/MYLANTA) 200-200-20 MG/5ML suspension 30 mL  30 mL Oral Q4H PRN Trudy Carwin, NP       buPROPion  (WELLBUTRIN  XL) 24 hr tablet 150 mg  150 mg Oral Daily Parker, Alvin S, MD   150 mg at 06/11/24 9148   busPIRone  (BUSPAR ) tablet 10 mg  10 mg Oral BID Nwoko, Agnes I, NP   10 mg at 06/11/24 9148   cyanocobalamin  (VITAMIN B12) tablet 1,000 mcg  1,000 mcg Oral Daily Nwoko, Agnes I, NP   1,000 mcg at 06/11/24 9148   haloperidol  (HALDOL ) tablet 5 mg  5 mg Oral TID PRN Trudy Carwin, NP       And   diphenhydrAMINE  (BENADRYL ) capsule 50 mg  50 mg Oral TID PRN Trudy Carwin, NP       fluticasone  (FLOVENT  HFA) 220 MCG/ACT inhaler 1 puff  1 puff Inhalation BID Collene Gouge I, NP   1 puff at 06/10/24 2113   gabapentin  (NEURONTIN ) capsule 100 mg  100 mg Oral QHS Collene Gouge I, NP   100 mg at 06/10/24 2112   hydrOXYzine  (ATARAX ) tablet 25 mg  25 mg Oral TID PRN Rashaun Wichert, MD   25 mg at 06/11/24 0858   levothyroxine  (SYNTHROID ) tablet 200 mcg  200 mcg Oral Daily Nwoko, Agnes I, NP   200 mcg at 06/11/24 9390   magnesium  hydroxide (MILK OF MAGNESIA) suspension 30 mL  30 mL Oral Daily PRN Trudy Carwin, NP   30 mL at 06/08/24 1957   metoprolol  succinate (TOPROL -XL) 24 hr tablet 50 mg  50 mg Oral Daily Nwoko, Agnes I, NP   50 mg at 06/11/24 9148   mirtazapine  (REMERON ) tablet 15 mg  15 mg Oral QHS Nwoko, Agnes I, NP   15 mg at 06/10/24 2112   naproxen  (NAPROSYN ) tablet 375 mg  375 mg  Oral BID PRN Collene Gouge I, NP       OLANZapine  (ZYPREXA ) injection 10 mg  10 mg Intramuscular TID PRN Trudy Carwin, NP       OLANZapine  (ZYPREXA ) injection 5 mg  5 mg Intramuscular TID PRN Trudy Carwin, NP       [  START ON 06/21/2024] Testosterone  Cypionate SOLN 100 mg  100 mg Injection Q14 Days Nwoko, Agnes I, NP       traZODone  (DESYREL ) tablet 50 mg  50 mg Oral QHS PRN Collene Gouge I, NP   50 mg at 06/10/24 2112    Lab Results:  No results found for this or any previous visit (from the past 48 hours).   Blood Alcohol level:  Lab Results  Component Value Date   ETH 16 (H) 06/07/2024    Metabolic Disorder Labs: Lab Results  Component Value Date   HGBA1C 5.6 06/07/2024   MPG 114 06/07/2024   No results found for: PROLACTIN Lab Results  Component Value Date   CHOL 200 06/07/2024   TRIG 162 (H) 06/07/2024   HDL 63 06/07/2024   CHOLHDL 3.2 06/07/2024   VLDL 32 06/07/2024   LDLCALC 105 (H) 06/07/2024   LDLCALC 106 (H) 05/03/2023    Physical Findings: AIMS:  ,  ,  ,  ,  ,  ,   CIWA:    COWS:     Musculoskeletal: Strength & Muscle Tone: within normal limits Gait & Station: normal Patient leans: N/A  Psychiatric Specialty Exam:  Presentation  General Appearance:  Casual; Fairly Groomed  Eye Contact: Good  Speech: Clear and Coherent; Normal Rate  Speech Volume: Normal  Handedness: Right   Mood and Affect  Mood: Anxious; Depressed  Affect: Congruent; Depressed; Flat   Thought Process  Thought Processes: Coherent  Descriptions of Associations:Intact  Orientation:Full (Time, Place and Person)  Thought Content:Logical  History of Schizophrenia/Schizoaffective disorder:No data recorded Duration of Psychotic Symptoms:No data recorded Hallucinations:No data recorded Ideas of Reference:None  Suicidal Thoughts:No data recorded Homicidal Thoughts:No data recorded  Sensorium  Memory: Immediate Good; Recent Good; Remote  Good  Judgment: Fair  Insight: Fair   Art therapist  Concentration: Fair  Attention Span: Fair  Recall: Good  Fund of Knowledge: Fair  Language: Good   Psychomotor Activity  Psychomotor Activity:No data recorded  Assets  Assets: Communication Skills; Desire for Improvement; Financial Resources/Insurance; Housing; Resilience   Sleep  Sleep:No data recorded   Physical Exam: Physical Exam ROS Blood pressure (!) 147/96, pulse (!) 58, temperature 98.1 F (36.7 C), temperature source Oral, resp. rate 16, height 5' 3 (1.6 m), weight 70.9 kg, SpO2 100%. Body mass index is 27.67 kg/m.   Treatment Plan Summary: Daily contact with patient to assess and evaluate symptoms and progress in treatment and Medication management  Patient is a 43 year old transgender male with the above-stated past psychiatric history who is seen in follow-up.  Chart reviewed. Patient discussed with nursing. Patient presents with improved mood, affect, and engagement, actively participating in group sessions and reporting good sleep, appetite, and focus. He denies depression, anxiety, suicidal or homicidal ideation, hallucinations, paranoia, or delusions. He expresses gratitude for the support received, identifies strong social supports, and reports no medication side effects or physical complaints. Given his stability, insight, and absence of acute psychiatric symptoms, patient will likely be ready for discharge on Monday.   Diagnoses/ Active problems: -MDD, recurrent, severe, w/o psychosis.   PLAN:  Safety and Monitoring: continue inpatient psych admission; 15-minute checks; daily contact with patient to assess and evaluate symptoms and progress in treatment; psychoeducation.Vital signs: q12 hours. Precautions: suicide, elopement, and assault. Placed on room lock out for meals, snacks and groups.  Medications: Psych: -Continue Buspar  10 mg po bid for anxiety.  -Continue  Wellbutrin  XL 150 mg po daily for depression.  -Continue  hydroxyzine  25 mg po tid prn for anxiety.  -Continue Mirtazapine  15 mg po Q hs for depression/insomnia.  - Continue Trazodone  50 mg po Q hs prn for insomnia.  -Continue gabapentin  100 mg po daily at bedtime.   Agitation protocols.  -Continue as recommended (see MAR).   Medical.  -Continue Albuterol  inhaler 1-2 puffs Q 6 hrs prn for SOB.  -Continue Vitamin B12 1,000 mg po daily for supplementation.  -Continue Flovent  inhaler 1 puff bid for SOB.  -Continue Testosterone  soln 100 mg by injection every 14 days.  -Tramadol  50mg  PO once 8/10 for pain.   Other PRNS -Continue Tylenol  650 mg every 6 hours PRN for mild pain -Continue Maalox 30 ml Q 4 hrs PRN for indigestion -Continue MOM 30 ml po Q 6 hrs for constipation   Pertinent Labs: no new labs ordered today  Consults: No new consults placed since yesterday    Discharge Planning: -Social work and case management to assist with discharge planning and identification of hospital follow-up needs prior to discharge -Estimated LOS: 5 days -Discharge Concerns: Need to establish a safety plan; Medication compliance and effectiveness -Discharge Goals: Return home with outpatient referrals for mental health follow-up including medication management/psychotherapy  Total Time Spent in Direct Patient Care:  I personally spent 35 minutes on the unit in direct patient care. The direct patient care time included face-to-face time with the patient, reviewing the patient's chart, communicating with other professionals, and coordinating care. Greater than 50% of this time was spent in counseling or coordinating care with the patient regarding goals of hospitalization, psycho-education, and discharge planning needs.   Neil Appl, MD 06/11/2024, 1:21 PM

## 2024-06-11 NOTE — Progress Notes (Signed)
 D:  Patient's self inventory sheet, patient has fair sleep, was given medication.  Good appetite, normal energy level, good concentration.  Denied depression, hopeless and anxiety.  Denied withdrawals.  Denied SI.  Denied physical problems  Has pain in back and L abdomen .  Goal is stay in a good mood and get along with peers. A:  Medications administered per MD orders.  Emotional support  and encouragement given patient. R:  Denied SI and HI, contracts for safety.  Denied A/V hallucinations.  Safety maintained with 15 minute checks. o

## 2024-06-11 NOTE — Plan of Care (Signed)
   Problem: Education: Goal: Emotional status will improve Outcome: Progressing   Problem: Activity: Goal: Sleeping patterns will improve Outcome: Progressing

## 2024-06-11 NOTE — Progress Notes (Signed)
   06/11/24 2318  Psych Admission Type (Psych Patients Only)  Admission Status Involuntary  Psychosocial Assessment  Patient Complaints None  Eye Contact Fair  Facial Expression Animated  Affect Appropriate to circumstance  Speech Logical/coherent  Interaction Assertive  Motor Activity Other (Comment) (WDL)  Appearance/Hygiene Unremarkable  Behavior Characteristics Appropriate to situation  Mood Pleasant  Thought Process  Coherency WDL  Content WDL  Delusions None reported or observed  Perception WDL  Hallucination None reported or observed  Judgment WDL  Confusion None  Danger to Self  Current suicidal ideation? Denies  Self-Injurious Behavior No self-injurious ideation or behavior indicators observed or expressed   Agreement Not to Harm Self Yes  Description of Agreement verbal  Danger to Others  Danger to Others None reported or observed

## 2024-06-11 NOTE — Progress Notes (Signed)
Pt did not attend goals group. 

## 2024-06-11 NOTE — Group Note (Signed)
 Date:  06/11/2024 Time:  5:09 PM  Group Topic/Focus:  Healthy Communication:   The focus of this group is to discuss communication, barriers to communication, as well as healthy ways to communicate with others.    Participation Level:  Active  Participation Quality:  Appropriate and Attentive  Affect:  Appropriate  Cognitive:  Alert and Appropriate  Insight: Appropriate and Good  Engagement in Group:  Engaged  Modes of Intervention:  Activity, Discussion, Education, Exploration, and Socialization  Additional Comments:    Anthony Rowe 06/11/2024, 5:09 PM

## 2024-06-11 NOTE — Plan of Care (Signed)
 Nurse discussed anxiety, depression and coping skills with patient.

## 2024-06-11 NOTE — Progress Notes (Addendum)
(  Sleep Hours) - 7 hours (Any PRNs that were needed, meds refused, or side effects to meds)-  Trazodone  50 mg, Vistaril  25 mg (Any disturbances and when (visitation, over night)- none (Concerns raised by the patient)-  None (SI/HI/AVH)-  Denies

## 2024-06-12 DIAGNOSIS — F329 Major depressive disorder, single episode, unspecified: Secondary | ICD-10-CM

## 2024-06-12 MED ORDER — MIRTAZAPINE 15 MG PO TABS: 15.0000 mg | ORAL_TABLET | Freq: Every day | ORAL | 0 refills | Status: AC

## 2024-06-12 MED ORDER — BUSPIRONE HCL 10 MG PO TABS: 10.0000 mg | ORAL_TABLET | Freq: Two times a day (BID) | ORAL | 0 refills | Status: AC

## 2024-06-12 MED ORDER — BUPROPION HCL ER (XL) 150 MG PO TB24: 150.0000 mg | ORAL_TABLET | Freq: Every day | ORAL | 0 refills | Status: AC

## 2024-06-12 NOTE — Discharge Summary (Signed)
 Physician Discharge Summary Note  Patient:  Anthony Rowe is an 43 y.o., adult MRN:  [id number] DOB:  June 27, 1981 Patient phone:  [phone] (home)  Patient address:   [address] [place] [address],  Total Time spent with patient: 45 minutes  Date of Admission:  06/07/2024 Date of Discharge: 06/12/2024  Reason for Admission:   (Below information from behavioral health assessment has been reviewed by me and I agreed with the findings): 43 y.o. adult transgender male with sex re-assignment surgery completed. He presents to the [facility] today as a walk in, with complaints of worsening suicidal ideations in the context of psychosocial stressors & unresolved grief While at the [facility], patient reports suicidal thoughts starting from when his wife passed away in Feb 09, 2024 of last year, 2024,and has been  worsening over the past 24 hours, after the father of his step-daughter came to the school with the Police Department & retrieved the child.   Patient denies any plan at this time to harm himself, but reports that he has an intent to do so.  He reports hx of suicide attempt in his childhood during which he choked himself. He reports homicidal ideations towards his step-Childrens' father but denies having a plan to harm him, but reports an intent to do so. He is unable to promise that he will not bring harm to himself or step-Childrens' father if discharged today. Patient reports Hx of PTSD, MDD, panic attacks & GAD. He reports that he goes to [facility] for mental health services. He is unable to recall his mental health medications, with the exception of trazodone . He reports that he prefers 50 mg nightly for sleep. He reports a hx of mental health related hospitalizations, but states that this was in his childhood years.  He reports a hx of self-harming behaviors by cutting himself also in his childhood. Reports occasional AH of deceased wife's voice, otherwise, denies AVH. Denies paranoia, denies delusional  thinking and there are no overt signs of psychosis. 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 He reports worsening intrusive thoughts of suicide, feelings of guilt related to wife's death, states that wife had told him that she was leaving, and he thought she was going somewhere because they had been arguing about something to eat, and he later found her dead, attempted CPR, called emergency services, but they were unable to revive her. He reports unresolved grief related to her death, reports trouble with sleep, reports nightmares, verbalizes readiness to get help for his mental health.   Objective: During this evaluation, Anthony Rowe presents alert, oriented & aware of situation. He is visible on the unit, attending group sessions. He presents with sad facial expressions. He expressed how much he misses his wife. He reports the intrusive thoughts & flash backs of finding her dead in their apartment. He expressed how much he has worked to make sure his step children are well taking care. He expressed how worried he is about his left arm/hand that got surgically messed by his doctor during his gender re-assignment surgery. 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 He expressed homicidal ideations towards his wife' ex-husband trying to come between him & his step children after their mothers' passing just because he is their biological father.  Patient expressed that he was prescribed some medication by [facility] but was not consistently taking them. He is willing to go back to these medications while in this hospital. He is resumed on those medications including the his antihypertensive medication. He currently denies any SI, AVH, delusional thoughts or paranoia. He does not appear  to be responding to any internal stimuli. He is currently/passively suicidal.   Associated Signs/Symptoms:   Depression Symptoms:  depressed mood, insomnia, feelings of worthlessness/guilt, anxiety,   (Hypo) Manic Symptoms:  Labiality of Mood,   Anxiety Symptoms:  Excessive Worry,   Psychotic Symptoms:  Patient currently denies any SIHI, AVH, delusional thoughts or paranoia. He does note appear to be responding to any internal stimuli.   PTSD Symptoms: I saw my wife slumped after she passed. That still bothers me. Re-experiencing:  Flashbacks Intrusive Thoughts  Principal Problem: MDD (major depressive disorder) Discharge Diagnoses: Principal Problem:   MDD (major depressive disorder)   Past Psychiatric History:   Childhood suicidal ideations, hospitalized at the time in a psychiatric hospital.   Is the patient at risk to self? No.  Has the patient been a risk to self in the past 6 months? Yes.    Has the patient been a risk to self within the distant past? Yes.    Is the patient a risk to others? Yes.    Has the patient been a risk to others in the past 6 months? No.  Has the patient been a risk to others within the distant past? No.  Past Medical History:  Past Medical History:  Diagnosis Date   Asthma    Bronchitis    COVID-19    Endometriosis    Hypertension    Migraine    Thyroid  disease    hyperthyroidism per pt report    Past Surgical History:  Procedure Laterality Date   ABDOMINAL HYSTERECTOMY     ANKLE SURGERY     MASTECTOMY Bilateral 2017   male to male    Family History:  Family History  Problem Relation Age of Onset   Cancer Mother        lung   Hypertension Maternal Grandmother    Diabetes Maternal Grandmother    Thyroid  disease Neg Hx    Family Psychiatric  History:  MDD: My mother, my brother. Mood swings run in my family.   Social History:  Social History   Substance and Sexual Activity  Alcohol Use Yes     Social History    Substance and Sexual Activity  Drug Use Yes   Types: Marijuana    Social History   Socioeconomic History   Marital status: Married    Spouse name: Not on file   Number of children: Not on file   Years of education: Not on file   Highest education level: Not on file  Occupational History   Not on file  Tobacco Use   Smoking status: Never   Smokeless tobacco: Never  Vaping Use   Vaping status: Never Used  Substance and Sexual Activity   Alcohol use: Yes   Drug use: Yes    Types: Marijuana   Sexual activity: Not Currently  Other Topics Concern   Not on file  Social History Narrative   Not on file   Social Drivers of Health   Financial Resource Strain: Low Risk  (02/11/2024)   Received from [facility]   Overall Financial Resource Strain (CARDIA)    Difficulty of Paying Living Expenses: Not  hard at all  Food Insecurity: No Food Insecurity (06/08/2024)   Hunger Vital Sign    Worried About Running Out of Food in the Last Year: Never true    Ran Out of Food in the Last Year: Never true  Transportation Needs: No Transportation Needs (06/08/2024)   PRAPARE - Administrator, Civil Service (Medical): No    Lack of Transportation (Non-Medical): No  Physical Activity: Not on file  Stress: Not on file  Social Connections: Patient Declined (06/08/2024)   Social Connection and Isolation Panel    Frequency of Communication with Friends and Family: Patient declined    Frequency of Social Gatherings with Friends and Family: Patient declined    Attends Religious Services: Patient declined    Database administrator or Organizations: Patient declined    Attends Banker Meetings: Patient declined    Marital Status: Patient declined    Hospital Course:   Patient was admitted on suicide precautions.  Home medications was adjusted.  Bupropion  was reduced to 150 mg daily.  Patient was maintained on his other medicines.  Patient participated with unit groups and  therapeutic activities.  He did not engage in any self-injurious behavior.  Sleep-wake cycle was well-regulated.  Other biological functions were well-regulated.  Patient did not require any psychiatric or medical emergency measures during his hospital stay.  I met with the patient for the first time today.  He tells me that he was grieving the loss of his wife in March 2024.  States that he has 3 stepchildren from his late wife.  States that the minor set of twins with skipping classes.  In course of trying to help them get back on track, patient states that relationship with the biological father became strained.  Patient states that he was upset at that time when he is expressed thoughts of harming him.  States that he no longer harbors any such thoughts.  He has decided to allow him take care of the children and make decisions for the children.  Patient has no desire to get involved with the kids anymore.  Patient also reports being stressed by recent hand surgery we did not go too well.  He works as a Paediatric nurse stating that with deficits from his hand surgery he is not able to function well.  Patient states that the medical body is involved and he is suing the hospital and surgeon who did it for him.  States that all of this was at the front burner for him when he came into the hospital.  Patient is not endorsing any current suicidal thoughts.  He is not endorsing any current homicidal thoughts.  No thoughts of violence.  He plans to get back to the community and work on his issues.  I discussed that his TFT results with him.  He has elevated free T4 and very low TSH.  He is currently on 150 mg of Synthroid .  Patient is seeing his endocrinologist room.  Encouraged him to bring it to the endocrinologist attention.  Nursing staff reports that patient has been appropriate on the unit.  No challenging behavior.  No PRNs required.  No observed response to internal stimuli.  Parent did not voice any fertility  thoughts.  Patient and team agrees that he is back to his baseline.  Team agrees with discharge today.  Physical Findings: AIMS:  , ,  ,  ,  ,  ,   CIWA:    COWS:  Musculoskeletal: Strength & Muscle Tone: within normal limits Gait & Station: normal Patient leans: N/A   Psychiatric Specialty Exam:  Presentation  General Appearance:  Casually dressed, not in any distress, appropriate behavior, engaged politely.  No EPS.  Eye Contact: Good.  Speech: Spontaneous.  Normal rate, tone and volume.  Normal prosody of speech.  Mood and Affect  Mood: Euthymic.  Affect: Full range and appropriate.  Thought Process  Thought Processes: Linear and goal directed.  Descriptions of Associations:Intact  Orientation:Full (Time, Place and Person)  Thought Content: Future oriented.  No current suicidal thoughts.  No homicidal thoughts.  No thoughts of violence.  No negative ruminative flooding.  No guilty ruminations.  No delusional theme.  No obsessions.  Hallucinations: No hallucination in any modality.  Sensorium  Memory: Good.  Judgment: Good.  Insight: Good  Executive Functions  Concentration: Good.  Attention Span: Good.  Recall: Good.  Fund of Knowledge: Good.  Language: Good   Psychomotor Activity  Normal psychomotor activity    Physical Exam: Physical Exam ROS Blood pressure (!) 133/95, pulse 72, temperature 97.8 F (36.6 C), temperature source Oral, resp. rate 16, height 5' 3 (1.6 m), weight 70.9 kg, SpO2 100%. Body mass index is 27.67 kg/m.   Social History   Tobacco Use  Smoking Status Never  Smokeless Tobacco Never   Tobacco Cessation:  N/A, patient does not currently use tobacco products   Blood Alcohol level:  Lab Results  Component Value Date   ETH 16 (H) 06/07/2024    Metabolic Disorder Labs:  Lab Results  Component Value Date   HGBA1C 5.6 06/07/2024   MPG 114 06/07/2024   No results found for:  PROLACTIN Lab Results  Component Value Date   CHOL 200 06/07/2024   TRIG 162 (H) 06/07/2024   HDL 63 06/07/2024   CHOLHDL 3.2 06/07/2024   VLDL 32 06/07/2024   LDLCALC 105 (H) 06/07/2024   LDLCALC 106 (H) 05/03/2023    See Psychiatric Specialty Exam and Suicide Risk Assessment completed by Attending Physician prior to discharge.  Discharge destination:  Home  Is patient on multiple antipsychotic therapies at discharge:  No   Has Patient had three or more failed trials of antipsychotic monotherapy by history:  No  Recommended Plan for Multiple Antipsychotic Therapies: NA  Discharge Instructions     Diet - low sodium heart healthy   Complete by: As directed    Increase activity slowly   Complete by: As directed       Allergies as of 06/12/2024       Reactions   Frovatriptan Other (See Comments)   Numbness in both legs   Sulfa Antibiotics Other (See Comments), Itching   Makes patients legs numb   Dilaudid  [hydromorphone  Hcl]    Pt says medication make his legs go numb   Thiazide-type Diuretics Other (See Comments)   Oxycodone  Itching   Needs to take in combination with atarax         Medication List     STOP taking these medications    ascorbic acid 1000 MG tablet Commonly known as: VITAMIN C   azithromycin 250 MG tablet Commonly known as: ZITHROMAX   cholecalciferol 25 MCG (1000 UNIT) tablet Commonly known as: VITAMIN D3   DOCOSAHEXAENOIC ACID PO   FT Ashwagandha Extract 500 MG Caps Generic drug: Ashwagandha   hydrOXYzine  50 MG tablet Commonly known as: ATARAX    MAGNESIUM  OXIDE (ELEMENTAL) PO   methocarbamol  500 MG tablet Commonly known as: ROBAXIN   ondansetron  4 MG tablet Commonly known as: ZOFRAN    Oxycodone  HCl 20 MG Tabs   Syringe/Needle (Disp) 22G X 1-1/2 1 ML Misc   tirzepatide 5 MG/0.5ML Pen Commonly known as: ZEPBOUND   ZINC 15 PO       TAKE these medications      Indication  albuterol  108 (90 Base) MCG/ACT  inhaler Commonly known as: VENTOLIN  HFA Inhale 1-2 puffs into the lungs every 6 (six) hours as needed for wheezing or shortness of breath.  Indication: Asthma   buPROPion  150 MG 24 hr tablet Commonly known as: WELLBUTRIN  XL Take 1 tablet (150 mg total) by mouth daily. Start taking on: June 13, 2024 What changed: when to take this  Indication: Major Depressive Disorder   busPIRone  10 MG tablet Commonly known as: BUSPAR  Take 1 tablet (10 mg total) by mouth 2 (two) times daily.  Indication: Major Depressive Disorder   cyanocobalamin  1000 MCG tablet Commonly known as: VITAMIN B12 Take 1,000 mcg by mouth daily.  Indication: Inadequate Vitamin B12   Flovent  HFA 220 MCG/ACT inhaler Generic drug: fluticasone  Inhale 2 puffs into the lungs twice daily  Indication: Asthma   gabapentin  100 MG capsule Commonly known as: NEURONTIN  Take 100 mg by mouth at bedtime.  Indication: Neuropathic Pain   levothyroxine  200 MCG tablet Commonly known as: SYNTHROID  Take 1 tablet (200 mcg total) by mouth daily.  Indication: Underactive Thyroid    metoprolol  succinate 50 MG 24 hr tablet Commonly known as: TOPROL -XL Take 1 tablet by mouth daily.  Indication: Thyroid  disease   mirtazapine  15 MG tablet Commonly known as: REMERON  Take 1 tablet (15 mg total) by mouth at bedtime.  Indication: Major Depressive Disorder   naproxen  375 MG tablet Commonly known as: NAPROSYN  Take 1 tablet (375 mg total) by mouth 2 (two) times daily as needed (muscle pain).  Indication: Pain   Testosterone  Cypionate 200 MG/ML Soln Inject 100 mg as directed every 14 (fourteen) days.  Indication: Transgender Man   traZODone  50 MG tablet Commonly known as: DESYREL  Take 50 mg by mouth at bedtime as needed for sleep.  Indication: Trouble Sleeping        Follow-up Information     [facility] Follow up on 06/19/2024.   Why: You have a hospital follow up appointment for therapy and medication management services on  06/19/24 at 2:00 pm .  The appointment will be Virtual telehealth. Contact information: [address]  [address] [place] [address] [phone]         [facility] Follow up on 06/13/2024.   Specialty: Hospice and Palliative Medicine Why: Please call this provider on 06/13/24 at 9:00 am to personally schedule an appointment for grief/bereavement therapy services. Contact information: [address] [facility] [place]  [address] [phone]                Follow-up recommendations: Patient will stay on medication as recommended.  Patient will follow up as recommended.  No restrictions with respect to diet or level of activity.   Signed: Jerrell DELENA Forehand, MD 06/12/2024, 3:04 PM

## 2024-06-12 NOTE — Progress Notes (Signed)
  [facility] Adult Case Management Discharge Plan :  Will you be returning to the same living situation after discharge:  Yes,  patient will return to his home At discharge, do you have transportation home?: Yes,  Patient's cousin, Gordy Louder [phone], will pick him up at 1 PM Do you have the ability to pay for your medications: Yes,  patient has insurance  Release of information consent forms completed and in the chart;  Patient's signature needed at discharge.  Patient to Follow up at:  Follow-up Information     [facility] Follow up on 06/19/2024.   Why: You have a hospital follow up appointment for therapy and medication management services on 06/19/24 at 2:00 pm .  The appointment will be Virtual telehealth. Contact information: [address]  [address] [place] [address] [phone]         [facility] Follow up on 06/13/2024.   Specialty: Hospice and Palliative Medicine Why: Please call this provider on 06/13/24 at 9:00 am to personally schedule an appointment for grief/bereavement therapy services. Contact information: [address] [facility] [place]  [address] [phone]                Next level of care provider has access to  [facility] Link:no  Safety Planning and Suicide Prevention discussed: Yes,  Gordy Louder (cousin) [phone] and with patient  Has patient been referred to the Quitline?: Patient does not use tobacco/nicotine products  Patient has been referred for addiction treatment:  At admission, patient tested positive for marijuana.  Patient said he stopped using marijuana on his own before he was admitted to the hospital.  If needed, he will discuss this with his therapist.   Jackye Dever O Journe Hallmark, LCSWA 06/12/2024, 9:53 AM

## 2024-06-12 NOTE — Progress Notes (Signed)
(  Sleep Hours) - 12 hours, some on dayshift  (Any PRNs that were needed, meds refused, or side effects to meds)- Vistaril , Trazodone , Tylenol , Naproxen  given (Any disturbances and when (visitation, over night)- Pt reports continued pain in back, left flank -Pt states did report this to doctor (Concerns raised by the patient)-  Pain initially reported in back and left flank.  Pt did interact in dayroom, and did not appear in acute distress.  Pt did appear to sleep this shift. (SI/HI/AVH)-  Denies

## 2024-06-12 NOTE — Progress Notes (Signed)
 Anthony Rowe D/C'd Home per MD order.  Discussed with the patient and all questions fully answered.   An After Visit Summary was printed and given to the patient. Patient received prescription.  D/c education completed with patient including follow up instructions, medication list, d/c activities limitations if indicated, with other d/c instructions as indicated by MD - patient able to verbalize understanding, all questions fully answered.   Patient instructed to return to ED, call 911, or call MD for any changes in condition.   Patient escorted to the main entrance, and D/C home via private auto.  [name] Doing 06/12/2024 1:54 PM

## 2024-06-12 NOTE — Plan of Care (Signed)
  Problem: Education: Goal: Mental status will improve Outcome: Completed/Met   Problem: Health Behavior/Discharge Planning: Goal: Identification of resources available to assist in meeting health care needs will improve Outcome: Completed/Met   Problem: Coping: Goal: Ability to verbalize frustrations and anger appropriately will improve Outcome: Completed/Met   Problem: Activity: Goal: Sleeping patterns will improve Outcome: Completed/Met   Problem: Activity: Goal: Interest or engagement in activities will improve Outcome: Completed/Met   Problem: Physical Regulation: Goal: Ability to maintain clinical measurements within normal limits will improve Outcome: Completed/Met

## 2024-06-12 NOTE — BHH Suicide Risk Assessment (Signed)
 [facility] Discharge Suicide Risk Assessment   Principal Problem: MDD (major depressive disorder) Discharge Diagnoses: Principal Problem:   MDD (major depressive disorder)   Total Time spent with patient: 30 minutes  Musculoskeletal: Strength & Muscle Tone: within normal limits Gait & Station: normal Patient leans: N/A  Psychiatric Specialty Exam  Presentation  General Appearance:  Casually dressed, not in any distress, appropriate behavior, engaged politely.  No EPS.  Eye Contact: Good.  Speech: Spontaneous.  Normal rate, tone and volume.  Normal prosody of speech.  Mood and Affect  Mood: Euthymic.  Affect: Full range and appropriate.  Thought Process  Thought Processes: Linear and goal directed.  Descriptions of Associations:Intact  Orientation:Full (Time, Place and Person)  Thought Content: Future oriented.  No current suicidal thoughts.  No homicidal thoughts.  No thoughts of violence.  No negative ruminative flooding.  No guilty ruminations.  No delusional theme.  No obsessions.  Hallucinations: No hallucination in any modality.  Sensorium  Memory: Good.  Judgment: Good.  Insight: Good  Executive Functions  Concentration: Good.  Attention Span: Good.  Recall: Good.  Fund of Knowledge: Good.  Language: Good   Psychomotor Activity  Normal psychomotor activity  Physical Exam: Physical Exam ROS Blood pressure (!) 133/95, pulse 72, temperature 97.8 F (36.6 C), temperature source Oral, resp. rate 16, height 5' 3 (1.6 m), weight 70.9 kg, SpO2 100%. Body mass index is 27.67 kg/m.  Mental Status Per Nursing Assessment::   On Admission:  NA  Demographic Factors:  Male, Adolescent or young adult, and Gay, lesbian, or bisexual orientation  Loss Factors: Decline in physical health  Historical Factors: NA  Risk Reduction Factors:   Positive social support, Positive therapeutic relationship, and Positive coping skills or problem  solving skills  Continued Clinical Symptoms:  Patient is no longer having suicidal thoughts.  Patient is no longer having homicidal thoughts.  Patient is future oriented.  No overwhelming anxiety lately.  No evidence of mania.  No evidence of psychosis.  Cognitive Features That Contribute To Risk:  None    Suicide Risk:  Minimal: No current suicidal thoughts.  No current homicidal thoughts.  No current thoughts of violence.  Modifiable risk factor targeted during this admission as mood symptoms.  Patient has tolerated recent adjustments well. Patient is stable for care at the lower setting.  Follow-up Information     [facility] Follow up on 06/19/2024.   Why: You have a hospital follow up appointment for therapy and medication management services on 06/19/24 at 2:00 pm .  The appointment will be Virtual telehealth. Contact information: [address]  [address] [place] [address] [phone]         [facility] Follow up on 06/13/2024.   Specialty: Hospice and Palliative Medicine Why: Please call this provider on 06/13/24 at 9:00 am to personally schedule an appointment for grief/bereavement therapy services. Contact information: [address] [facility] [place]  [address] [phone]                Plan Of Care/Follow-up recommendations:  See discharge summary  Anthony DELENA Forehand, MD 06/12/2024, 1:19 PM

## 2024-06-12 NOTE — Plan of Care (Signed)
  Problem: Education: Goal: Emotional status will improve Outcome: Progressing   Problem: Education: Goal: Verbalization of understanding the information provided will improve Outcome: Progressing

## 2024-06-12 NOTE — [facility] Group Notes (Signed)
 Spiritual care group on grief and loss facilitated by Chaplain Rockie Sofia, [facility]  Group Goal: Support / Education around grief and loss  Members engage in facilitated group support and psycho-social education.  Group Description:  Following introductions and group rules, group members engaged in facilitated group dialogue and support around topic of loss, with particular support around experiences of loss in their lives. Group Identified types of loss (relationships / self / things) and identified patterns, circumstances, and changes that precipitate losses. Reflected on thoughts / feelings around loss, normalized grief responses, and recognized variety in grief experience. Group encouraged individual reflection on safe space and on the coping skills that they are already utilizing.  Group drew on Adlerian / Rogerian and narrative framework  Patient Progress: Anthony Rowe attended group until time of discharge.

## 2024-06-12 NOTE — Group Note (Signed)
 Date:  06/12/2024 Time:  9:51 AM  Group Topic/Focus:  Goals Group:   The focus of this group is to help patients establish daily goals to achieve during treatment and discuss how the patient can incorporate goal setting into their daily lives to aide in recovery.    Participation Level:  Active  Participation Quality:  Appropriate  Affect:  Appropriate  Cognitive:  Appropriate  Insight: Appropriate  Engagement in Group:  Engaged  Modes of Intervention:  Discussion  Additional Comments:  Pt discussed his goals for today and actively listened to others during group.  Rani Idler A Janeice Stegall 06/12/2024, 9:51 AM

## 2024-06-12 NOTE — [facility] Suicide Risk Assessment (Signed)
 [facility] INPATIENT:  Family/Significant Other Suicide Prevention Education  Suicide Prevention Education:  Education Completed; with patient,  (name of family member/significant other) has been identified by the patient as the family member/significant other with whom the patient will be residing, and identified as the person(s) who will aid the patient in the event of a mental health crisis (suicidal ideations/suicide attempt).  With written consent from the patient, the family member/significant other has been provided the following suicide prevention education, prior to the and/or following the discharge of the patient.  Patient was given Suicide Prevention Information brochure.  Patient confirmed that his cousin has his guns, and he doesn't have access to them.  The suicide prevention education provided includes the following: Suicide risk factors Suicide prevention and interventions National Suicide Hotline telephone number [facility] assessment telephone number [facility] Emergency Assistance 911 [facility] and/or Residential Mobile Crisis Unit telephone number  Request made of family/significant other to: Remove weapons (e.g., guns, rifles, knives), all items previously/currently identified as safety concern.   Remove drugs/medications (over-the-counter, prescriptions, illicit drugs), all items previously/currently identified as a safety concern.  The family member/significant other verbalizes understanding of the suicide prevention education information provided.  The family member/significant other agrees to remove the items of safety concern listed above.  Anthony Rowe, LCSWA 06/12/2024, 9:51 AM

## 2024-06-12 NOTE — Plan of Care (Signed)
  Problem: Coping: Goal: Ability to verbalize frustrations and anger appropriately will improve Outcome: Completed/Met   Problem: Activity: Goal: Interest or engagement in activities will improve Outcome: Completed/Met   Problem: Education: Goal: Mental status will improve Outcome: Completed/Met

## 2024-06-12 NOTE — [facility] Suicide Risk Assessment (Addendum)
 [facility] INPATIENT:  Family/Significant Other Suicide Prevention Education  Suicide Prevention Education:  Education Completed; Gordy Louder (cousin) [phone],  (name of family member/significant other) has been identified by the patient as the family member/significant other with whom the patient will be residing, and identified as the person(s) who will aid the patient in the event of a mental health crisis (suicidal ideations/suicide attempt).  With written consent from the patient, the family member/significant other has been provided the following suicide prevention education, prior to the and/or following the discharge of the patient.  Cousin said he will pick up patient at 1 PM.  Cousin said that he has patient's guns, and patient doesn't have access to it.  He didn't have any concerns about patient being discharged.    The suicide prevention education provided includes the following: Suicide risk factors Suicide prevention and interventions National Suicide Hotline telephone number [facility] assessment telephone number [facility] Emergency Assistance 911 [facility] and/or Residential Mobile Crisis Unit telephone number  Request made of family/significant other to: Remove weapons (e.g., guns, rifles, knives), all items previously/currently identified as safety concern.   The family member/significant other verbalizes understanding of the suicide prevention education information provided.  The family member/significant other agrees to remove the items of safety concern listed above.  Greenley Martone O Baran Kuhrt, LCSWA 06/12/2024, 8:50 AM

## 2024-07-08 ENCOUNTER — Ambulatory Visit (INDEPENDENT_AMBULATORY_CARE_PROVIDER_SITE_OTHER)

## 2024-07-08 ENCOUNTER — Encounter: Payer: Self-pay | Admitting: Emergency Medicine

## 2024-07-08 ENCOUNTER — Ambulatory Visit
Admission: EM | Admit: 2024-07-08 | Discharge: 2024-07-08 | Disposition: A | Discharge status: Home / Self Care | Attending: Emergency Medicine | Admitting: Emergency Medicine

## 2024-07-08 DIAGNOSIS — G8918 Other acute postprocedural pain: Secondary | ICD-10-CM | POA: Diagnosis not present

## 2024-07-08 DIAGNOSIS — Z87442 Personal history of urinary calculi: Secondary | ICD-10-CM | POA: Diagnosis not present

## 2024-07-08 DIAGNOSIS — Z79899 Other long term (current) drug therapy: Secondary | ICD-10-CM | POA: Diagnosis not present

## 2024-07-08 DIAGNOSIS — M545 Low back pain, unspecified: Secondary | ICD-10-CM | POA: Diagnosis present

## 2024-07-08 DIAGNOSIS — Y831 Surgical operation with implant of artificial internal device as the cause of abnormal reaction of the patient, or of later complication, without mention of misadventure at the time of the procedure: Secondary | ICD-10-CM | POA: Insufficient documentation

## 2024-07-08 DIAGNOSIS — M25532 Pain in left wrist: Secondary | ICD-10-CM | POA: Diagnosis not present

## 2024-07-08 DIAGNOSIS — S60852A Superficial foreign body of left wrist, initial encounter: Secondary | ICD-10-CM

## 2024-07-08 DIAGNOSIS — T8484XA Pain due to internal orthopedic prosthetic devices, implants and grafts, initial encounter: Secondary | ICD-10-CM | POA: Insufficient documentation

## 2024-07-08 DIAGNOSIS — Z945 Skin transplant status: Secondary | ICD-10-CM | POA: Diagnosis not present

## 2024-07-08 LAB — POCT URINE DIPSTICK
Bilirubin, UA: NEGATIVE
Blood, UA: NEGATIVE
Glucose, UA: NEGATIVE mg/dL
Ketones, POC UA: NEGATIVE mg/dL
Nitrite, UA: NEGATIVE
Protein Ur, POC: 30 mg/dL — AB
Spec Grav, UA: 1.015 (ref 1.010–1.025)
Urobilinogen, UA: 0.2 U/dL
pH, UA: 7.5 (ref 5.0–8.0)

## 2024-07-08 MED ORDER — IBUPROFEN 800 MG PO TABS
800.0000 mg | ORAL_TABLET | Freq: Once | ORAL | Status: AC
  Administered 2024-07-08: 800 mg via ORAL

## 2024-07-08 MED ORDER — CYCLOBENZAPRINE HCL 10 MG PO TABS: 10.0000 mg | ORAL_TABLET | Freq: Two times a day (BID) | ORAL | 0 refills | Status: AC | PRN

## 2024-07-08 MED ORDER — IBUPROFEN 800 MG PO TABS: 800.0000 mg | ORAL_TABLET | Freq: Three times a day (TID) | ORAL | 0 refills | Status: AC

## 2024-07-08 NOTE — ED Triage Notes (Signed)
 Pt presents c/o lower back pain (more severe on left side) x 3 weeks. Pt reports he has had kidney stones before and the pain feels similar. Pt denies emesis and diarrhea.   Pt also requesting wound check on left wrist. Pt had staples placed with [employer] on 02/10/24. Pt states for about a week now he has felt a piece of what looks to be a staple poking out if his skin. There should be no staples in the area as all of them should have been removed.

## 2024-07-08 NOTE — ED Provider Notes (Signed)
 [name]    CSN: [id number] Arrival date & time: 07/08/24  1100      History   Chief Complaint Chief Complaint  Patient presents with   Back Pain   Wound Check    HPI Anthony Rowe is a 43 y.o. adult.  Here with 3 weeks of bilateral low back pain Tension, tightness, and spasm History of kidney stone and want's to make sure its not this.  Not having hematuria or other urinary symptoms No injury, trauma, fall No bladder/bowel dysfunction, no weakness/paresthesias  Has tried flexeril  that helped but he ran out. Also using oxycodone  from pain clinic.    Also wants to have left wrist evaluated.  He had surgical clips on April 10 after skin graft.  On 6/20 he went to the ED for wrist pain, xray at that time showed numerous surgical clips. He is concerned that there is something in his skin; about 1 week ago a silver spot appeared and it's painful. Reports he is currently involved in a medical lawsuit with the original surgeon due to postoperative issues. He has recently been undergoing physical and occupational therapy for the hand, better movement now.  Last saw ortho surg on 8/18, documented improvement in function Next appointment is 9/29  Past Medical History:  Diagnosis Date   Asthma    Bronchitis    COVID-19    Endometriosis    Hypertension    Migraine    Thyroid  disease    hyperthyroidism per pt report    Patient Active Problem List   Diagnosis Date Noted   MDD (major depressive disorder), recurrent severe, without psychosis (HCC) 06/07/2024   MDD (major depressive disorder) 06/07/2024   Gender dysphoria 06/29/2022   Acute respiratory disease due to COVID-19 virus 02/10/2020   Depression with anxiety 02/10/2020   Obesity (BMI 30.0-34.9) 02/10/2020   Polycythemia 12/20/2019   Hypothyroidism 11/06/2019   Hypokalemia 05/29/2019   Migraine 08/12/2018   HTN (hypertension) 08/12/2018   Asthma 08/12/2018   Male-to-male transgender person  08/12/2018   Gender dysphoria 08/19/2016   Left ankle pain 09/25/2013   Hypovitaminosis D 09/25/2013    Past Surgical History:  Procedure Laterality Date   ABDOMINAL HYSTERECTOMY     ANKLE SURGERY     MASTECTOMY Bilateral 2017   male to male     OB History   No obstetric history on file.      Home Medications    Prior to Admission medications   Medication Sig Start Date End Date Taking? Authorizing Provider  cyclobenzaprine  (FLEXERIL ) 10 MG tablet Take 1 tablet (10 mg total) by mouth 2 (two) times daily as needed for muscle spasms. 07/08/24  Yes Shelbee Apgar, [place], PA-C  ibuprofen  (ADVIL ) 800 MG tablet Take 1 tablet (800 mg total) by mouth 3 (three) times daily. 07/08/24  Yes Corney Knighton, [place], PA-C  albuterol  (VENTOLIN  HFA) 108 (90 Base) MCG/ACT inhaler Inhale 1-2 puffs into the lungs every 6 (six) hours as needed for wheezing or shortness of breath. 12/09/22   Enedelia Dorna HERO, FNP  buPROPion  (WELLBUTRIN  XL) 150 MG 24 hr tablet Take 1 tablet (150 mg total) by mouth daily. 06/13/24   Hinda Jerrell LABOR, MD  busPIRone  (BUSPAR ) 10 MG tablet Take 1 tablet (10 mg total) by mouth 2 (two) times daily. 06/12/24   IzediunoJerrell LABOR, MD  cyanocobalamin  (VITAMIN B12) 1000 MCG tablet Take 1,000 mcg by mouth daily.    [provider]  fluticasone  (FLOVENT  HFA) 220 MCG/ACT inhaler Inhale 2  puffs into the lungs twice daily 02/09/20   Jeneal Danita Macintosh, MD  gabapentin  (NEURONTIN ) 100 MG capsule Take 100 mg by mouth at bedtime.    [provider]  levothyroxine  (SYNTHROID ) 200 MCG tablet Take 1 tablet (200 mcg total) by mouth daily. 02/10/24   Shamleffer, Ibtehal Jaralla, MD  metoprolol  succinate (TOPROL -XL) 50 MG 24 hr tablet Take 1 tablet by mouth daily. 02/04/24   [provider]  mirtazapine  (REMERON ) 15 MG tablet Take 1 tablet (15 mg total) by mouth at bedtime. 06/12/24   Izediuno, Jerrell LABOR, MD  naproxen  (NAPROSYN ) 375 MG tablet Take 1 tablet (375 mg total) by  mouth 2 (two) times daily as needed (muscle pain). 06/30/23   Mayer, Jodi R, NP  Testosterone  Cypionate 200 MG/ML SOLN Inject 100 mg as directed every 14 (fourteen) days. 03/20/24   Shamleffer, Ibtehal Jaralla, MD  traZODone  (DESYREL ) 50 MG tablet Take 50 mg by mouth at bedtime as needed for sleep. 10/13/23   [provider]    Family History Family History  Problem Relation Age of Onset   Cancer Mother        lung   Hypertension Maternal Grandmother    Diabetes Maternal Grandmother    Thyroid  disease Neg Hx     Social History Social History   Tobacco Use   Smoking status: Never    Passive exposure: Never   Smokeless tobacco: Never  Vaping Use   Vaping status: Never Used  Substance Use Topics   Alcohol use: Yes   Drug use: Yes    Types: Marijuana     Allergies   Frovatriptan, Sulfa antibiotics, Dilaudid  [hydromorphone  hcl], Thiazide-type diuretics, and Oxycodone    Review of Systems Review of Systems As per HPI  Physical Exam Triage Vital Signs ED Triage Vitals  Encounter Vitals Group     BP 07/08/24 1149 133/86     Girls Systolic BP Percentile --      Girls Diastolic BP Percentile --      Boys Systolic BP Percentile --      Boys Diastolic BP Percentile --      Pulse Rate 07/08/24 1149 70     Resp 07/08/24 1149 16     Temp 07/08/24 1149 97.7 F (36.5 C)     Temp Source 07/08/24 1149 Oral     SpO2 07/08/24 1149 100 %     Weight 07/08/24 1149 156 lb 4.9 oz (70.9 kg)     Height --      Head Circumference --      Peak Flow --      Pain Score 07/08/24 1147 8     Pain Loc --      Pain Education --      Exclude from Growth Chart --    No data found.  Updated Vital Signs BP 133/86 (BP Location: Right Arm)   Pulse 70   Temp 97.7 F (36.5 C) (Oral)   Resp 16   Wt 156 lb 4.9 oz (70.9 kg)   SpO2 100%   BMI 27.69 kg/m   Physical Exam Vitals and nursing note reviewed.  Constitutional:      General: He is not in acute distress. HENT:      Mouth/Throat:     Mouth: Mucous membranes are moist.     Pharynx: Oropharynx is clear.  Eyes:     Extraocular Movements: Extraocular movements intact.     Conjunctiva/sclera: Conjunctivae normal.     Pupils: Pupils are equal,  round, and reactive to light.  Cardiovascular:     Rate and Rhythm: Normal rate and regular rhythm.     Heart sounds: Normal heart sounds.  Pulmonary:     Effort: Pulmonary effort is normal.     Breath sounds: Normal breath sounds.  Musculoskeletal:        General: Normal range of motion.     Cervical back: Normal range of motion. No rigidity or tenderness.       Back:     Comments: Muscular tenderness and spasm of the low back. No bony tenderness C-L spine. Full ROM of the left wrist without pain. Distal sensation intact all fingers. Grip strength 5/5. Cap refill < 2 seconds, radial pulse 2+  Skin:    General: Skin is warm and dry.     Comments: There is a pinpoint silver mark on the left wrist at edge of scarring. Well healing scar tissue at area of graft is noted. Wrist is non tender, there is no localized swelling or erythema.   Neurological:     General: No focal deficit present.     Mental Status: He is alert and oriented to person, place, and time.     Cranial Nerves: Cranial nerves 2-12 are intact. No cranial nerve deficit.     Sensory: Sensation is intact.     Motor: Motor function is intact. No weakness.     Coordination: Coordination is intact.     Gait: Gait is intact.     Deep Tendon Reflexes: Reflexes are normal and symmetric.     Comments: Strength 5/5. Sensation intact throughout     UC Treatments / Results  Labs (all labs ordered are listed, but only abnormal results are displayed) Labs Reviewed  POCT URINE DIPSTICK - Abnormal; Notable for the following components:      Result Value   Clarity, UA cloudy (*)    Protein Ur, POC =30 (*)    Leukocytes, UA Trace (*)    All other components within normal limits  URINE CULTURE     EKG  Radiology DG Wrist Complete Left Result Date: 07/08/2024 CLINICAL DATA:  Evaluation of foreign body. History of staple placement and subsequent removal. EXAM: LEFT WRIST - COMPLETE 4 VIEW COMPARISON:  Left forearm radiograph dated 04/21/2024 FINDINGS: There is no evidence of fracture or dislocation. Innumerable surgical clips surrounding the left wrist, similar to 04/21/2024. Soft tissues are unremarkable. IMPRESSION: Innumerable surgical clips surrounding the left wrist , similar to 04/21/2024, likely related to prior radial flap. Electronically Signed   By: Limin  Xu M.D.   On: 07/08/2024 13:26    Procedures Procedures   Medications Ordered in UC Medications  ibuprofen  (ADVIL ) tablet 800 mg (800 mg Oral Given 07/08/24 1242)    Initial Impression / Assessment and Plan / UC Course  I have reviewed the triage vital signs and the nursing notes.  Pertinent labs & imaging results that were available during my care of the patient were reviewed by me and considered in my medical decision making (see chart for details).  Afebrile, stable vitals  Declines IM toradol  today Oral ibuprofen  dose given for pain.  Left wrist with concern for foreign body There is a small silver spot seen and palpated in the left wrist. Left wrist xray today similar to 2 months ago with innumerous surgical clips. Discussion with patient. He will need to see either a hand specialist or plastic surgeon to have evaluation. Reports he has an appointment coming up, and  will follow up at that visit. Otherwise the graft area appears to be healing well.   Low back pain Muscular tenderness on exam. No bony tenderness to warrant need for plain films.  UA without blood. There are trace leuks so we will culture just to r/o infection, although not having overt urinary symptoms. Patient agreeable,.  Flexeril  is prescribed, drowsy precautions Ibuprofen  800 mg q6 hours, and continue other home meds from pain clinic.  Sees  PCP in 3 days   Discussed all with patient.  Patient is agreeable with plan. No questions at this time   Final Clinical Impressions(s) / UC Diagnoses   Final diagnoses:  Foreign body of left wrist  Acute bilateral low back pain without sciatica  Status post skin graft     Discharge Instructions      Xray of the wrist does show the surgical clips still present. The one you can see visibly in your skin has moved closer to the surface. This will need to be evaluated by the plastic surgeon or orthopedic surgeon - whoever you are following with for healing  For the back pain, I suspect muscular etiology and spasms. Take ibuprofen  -- 800 mg (1 tablet) every 6 hours for pain Continue your other home pain medications You can take the muscle relaxer Flexeril  twice daily. If the medication makes you drowsy, take only at bed time.     ED Prescriptions     Medication Sig Dispense Auth. Provider   ibuprofen  (ADVIL ) 800 MG tablet Take 1 tablet (800 mg total) by mouth 3 (three) times daily. 21 tablet Ferdie Bakken, PA-C   cyclobenzaprine  (FLEXERIL ) 10 MG tablet Take 1 tablet (10 mg total) by mouth 2 (two) times daily as needed for muscle spasms. 20 tablet Nihira Puello, [place], PA-C      PDMP not reviewed this encounter.   Amesha Bailey, [place], PA-C 07/08/24 1428

## 2024-07-08 NOTE — Discharge Instructions (Signed)
 Xray of the wrist does show the surgical clips still present. The one you can see visibly in your skin has moved closer to the surface. This will need to be evaluated by the plastic surgeon or orthopedic surgeon - whoever you are following with for healing  For the back pain, I suspect muscular etiology and spasms. Take ibuprofen  -- 800 mg (1 tablet) every 6 hours for pain Continue your other home pain medications You can take the muscle relaxer Flexeril  twice daily. If the medication makes you drowsy, take only at bed time.

## 2024-07-09 ENCOUNTER — Ambulatory Visit: Payer: Self-pay

## 2024-07-09 LAB — URINE CULTURE: Culture: 20000 — AB

## 2024-07-10 ENCOUNTER — Ambulatory Visit (HOSPITAL_COMMUNITY): Payer: Self-pay

## 2024-07-16 ENCOUNTER — Telehealth

## 2024-07-16 DIAGNOSIS — J4541 Moderate persistent asthma with (acute) exacerbation: Secondary | ICD-10-CM | POA: Diagnosis not present

## 2024-07-16 MED ORDER — PREDNISONE 20 MG PO TABS: 20.0000 mg | ORAL_TABLET | Freq: Two times a day (BID) | ORAL | 0 refills | Status: AC

## 2024-07-16 MED ORDER — PROMETHAZINE-DM 6.25-15 MG/5ML PO SYRP: 5.0000 mL | ORAL_SOLUTION | Freq: Four times a day (QID) | ORAL | 0 refills | Status: AC | PRN

## 2024-07-16 MED ORDER — AZITHROMYCIN 250 MG PO TABS: ORAL_TABLET | ORAL | 0 refills | Status: AC

## 2024-07-16 NOTE — Progress Notes (Signed)
 Virtual Visit Consent   Anthony Rowe, you are scheduled for a virtual visit with a [facility] provider today. Just as with appointments in the office, your consent must be obtained to participate. Your consent will be active for this visit and any virtual visit you may have with one of our providers in the next 365 days. If you have a MyChart account, a copy of this consent can be sent to you electronically.  As this is a virtual visit, video technology does not allow for your provider to perform a traditional examination. This may limit your provider's ability to fully assess your condition. If your provider identifies any concerns that need to be evaluated in person or the need to arrange testing (such as labs, EKG, etc.), we will make arrangements to do so. Although advances in technology are sophisticated, we cannot ensure that it will always work on either your end or our end. If the connection with a video visit is poor, the visit may have to be switched to a telephone visit. With either a video or telephone visit, we are not always able to ensure that we have a secure connection.  By engaging in this virtual visit, you consent to the provision of healthcare and authorize for your insurance to be billed (if applicable) for the services provided during this visit. Depending on your insurance coverage, you may receive a charge related to this service.  I need to obtain your verbal consent now. Are you willing to proceed with your visit today? Anthony Rowe has provided verbal consent on 07/16/2024 for a virtual visit (video or telephone). Anthony Lamp, FNP  Date: 07/16/2024 10:15 AM   Virtual Visit via Video Note   I, Anthony Rowe, connected with  Anthony Rowe  ([id number], 05-19-1981) on 07/16/24 at  9:30 AM EDT by a video-enabled telemedicine application and verified that I am speaking with the correct person using two identifiers.  Location: Patient: Virtual Visit Location Patient:  Home Provider: Virtual Visit Location Provider: Home Office   I discussed the limitations of evaluation and management by telemedicine and the availability of in person appointments. The patient expressed understanding and agreed to proceed.    History of Present Illness: Anthony Rowe is a 43 y.o. who identifies as a transgender male who was assigned adult at birth, and is being seen today for cough, wheezing, sob with history of asthma. In no distress. Has albuterol  to use. Cough is keeping him up at night. Yellow mucus. Head congestion. Sore throat. Sx 4-5 days worsening.   HPI: HPI  Problems:  Patient Active Problem List   Diagnosis Date Noted   MDD (major depressive disorder), recurrent severe, without psychosis (HCC) 06/07/2024   MDD (major depressive disorder) 06/07/2024   Gender dysphoria 06/29/2022   Acute respiratory disease due to COVID-19 virus 02/10/2020   Depression with anxiety 02/10/2020   Obesity (BMI 30.0-34.9) 02/10/2020   Polycythemia 12/20/2019   Hypothyroidism 11/06/2019   Hypokalemia 05/29/2019   Migraine 08/12/2018   HTN (hypertension) 08/12/2018   Asthma 08/12/2018   Male-to-male transgender person 08/12/2018   Gender dysphoria 08/19/2016   Left ankle pain 09/25/2013   Hypovitaminosis D 09/25/2013    Allergies:  Allergies  Allergen Reactions   Frovatriptan Other (See Comments)    Numbness in both legs   Sulfa Antibiotics Other (See Comments) and Itching    Makes patients legs numb   Dilaudid  [Hydromorphone  Hcl]     Pt says medication make his legs go numb  Thiazide-Type Diuretics Other (See Comments)   Oxycodone  Itching    Needs to take in combination with atarax    Medications:  Current Outpatient Medications:    azithromycin  (ZITHROMAX ) 250 MG tablet, Take 2 tablets on day 1, then 1 tablet daily on days 2 through 5, Disp: 6 tablet, Rfl: 0   predniSONE  (DELTASONE ) 20 MG tablet, Take 1 tablet (20 mg total) by mouth 2 (two) times daily with a  meal for 5 days., Disp: 10 tablet, Rfl: 0   promethazine -dextromethorphan (PROMETHAZINE -DM) 6.25-15 MG/5ML syrup, Take 5 mLs by mouth 4 (four) times daily as needed for up to 10 days for cough., Disp: 118 mL, Rfl: 0   albuterol  (VENTOLIN  HFA) 108 (90 Base) MCG/ACT inhaler, Inhale 1-2 puffs into the lungs every 6 (six) hours as needed for wheezing or shortness of breath., Disp: 8 g, Rfl: 0   buPROPion  (WELLBUTRIN  XL) 150 MG 24 hr tablet, Take 1 tablet (150 mg total) by mouth daily., Disp: 30 tablet, Rfl: 0   busPIRone  (BUSPAR ) 10 MG tablet, Take 1 tablet (10 mg total) by mouth 2 (two) times daily., Disp: 60 tablet, Rfl: 0   cyanocobalamin  (VITAMIN B12) 1000 MCG tablet, Take 1,000 mcg by mouth daily., Disp: , Rfl:    cyclobenzaprine  (FLEXERIL ) 10 MG tablet, Take 1 tablet (10 mg total) by mouth 2 (two) times daily as needed for muscle spasms., Disp: 20 tablet, Rfl: 0   fluticasone  (FLOVENT  HFA) 220 MCG/ACT inhaler, Inhale 2 puffs into the lungs twice daily, Disp: 1 Inhaler, Rfl: 0   gabapentin  (NEURONTIN ) 100 MG capsule, Take 100 mg by mouth at bedtime., Disp: , Rfl:    ibuprofen  (ADVIL ) 800 MG tablet, Take 1 tablet (800 mg total) by mouth 3 (three) times daily., Disp: 21 tablet, Rfl: 0   levothyroxine  (SYNTHROID ) 200 MCG tablet, Take 1 tablet (200 mcg total) by mouth daily., Disp: 90 tablet, Rfl: 3   metoprolol  succinate (TOPROL -XL) 50 MG 24 hr tablet, Take 1 tablet by mouth daily., Disp: , Rfl:    mirtazapine  (REMERON ) 15 MG tablet, Take 1 tablet (15 mg total) by mouth at bedtime., Disp: 30 tablet, Rfl: 0   naproxen  (NAPROSYN ) 375 MG tablet, Take 1 tablet (375 mg total) by mouth 2 (two) times daily as needed (muscle pain)., Disp: 14 tablet, Rfl: 0   Testosterone  Cypionate 200 MG/ML SOLN, Inject 100 mg as directed every 14 (fourteen) days., Disp: 2 mL, Rfl: 5   traZODone  (DESYREL ) 50 MG tablet, Take 50 mg by mouth at bedtime as needed for sleep., Disp: , Rfl:   Observations/Objective: Patient is  well-developed, well-nourished in no acute distress.  Resting comfortably  at home.  Head is normocephalic, atraumatic.  No labored breathing.  Speech is clear and coherent with logical content.  Patient is alert and oriented at baseline.    Assessment and Plan: 1. Moderate persistent asthmatic bronchitis with acute exacerbation (Primary)  Increase fluids, humidifier at night, tylenol  or ibuprofen  as directed, UC if sx worsen.   Follow Up Instructions: I discussed the assessment and treatment plan with the patient. The patient was provided an opportunity to ask questions and all were answered. The patient agreed with the plan and demonstrated an understanding of the instructions.  A copy of instructions were sent to the patient via MyChart unless otherwise noted below.     The patient was advised to call back or seek an in-person evaluation if the symptoms worsen or if the condition fails to improve as anticipated.  Josejuan Hoaglin, FNP

## 2024-08-02 ENCOUNTER — Other Ambulatory Visit

## 2024-08-07 ENCOUNTER — Ambulatory Visit: Payer: Self-pay | Admitting: Internal Medicine

## 2024-08-07 LAB — TESTOSTERONE, TOTAL, LC/MS/MS: Testosterone, Total, LC-MS-MS: 1343 ng/dL — ABNORMAL HIGH (ref 250–1100)

## 2024-08-07 LAB — T4, FREE: Free T4: 1.6 ng/dL (ref 0.8–1.8)

## 2024-08-07 LAB — TSH: TSH: 0.01 m[IU]/L — ABNORMAL LOW (ref 0.40–4.50)

## 2024-08-07 MED ORDER — LEVOTHYROXINE SODIUM 175 MCG PO TABS: 175.0000 ug | ORAL_TABLET | Freq: Every day | ORAL | 3 refills | Status: DC

## 2024-08-10 ENCOUNTER — Encounter: Payer: Self-pay | Admitting: Internal Medicine

## 2024-08-10 ENCOUNTER — Ambulatory Visit (INDEPENDENT_AMBULATORY_CARE_PROVIDER_SITE_OTHER): Admitting: Internal Medicine

## 2024-08-10 VITALS — BP 118/72 | HR 100 | Ht 63.0 in | Wt 148.0 lb

## 2024-08-10 DIAGNOSIS — E890 Postprocedural hypothyroidism: Secondary | ICD-10-CM | POA: Diagnosis not present

## 2024-08-10 DIAGNOSIS — F649 Gender identity disorder, unspecified: Secondary | ICD-10-CM | POA: Diagnosis not present

## 2024-08-10 MED ORDER — LEVOTHYROXINE SODIUM 175 MCG PO TABS: 175.0000 ug | ORAL_TABLET | Freq: Every day | ORAL | 3 refills | Status: DC

## 2024-08-10 NOTE — Progress Notes (Signed)
 Name: Anthony Rowe  MRN/ DOB: [id number], 06/20/1981    Age/ Sex: 43 y.o., adult     PCP: Shelda Atlas, MD   Reason for Endocrinology Evaluation: Gender dysphoria /hyperthyroidism     Initial Endocrinology Clinic Visit: 08/12/2018    PATIENT IDENTIFIER: [name] is a 43 y.o., adult with a past medical history of Asthma, migraine headaches . He has followed with [facility] since 08/12/2018 for consultative assistance with management of his gender dysphoria and hyperthyroidism  HISTORICAL SUMMARY: The patient was first noted with gender dysphoria at age 69. ( Male to Male ) . Puberty was at age 75.    S/P musculizing breast sx in 2017  Gender affirming treatment started in 2015  No hx of DVT, dyslipidemia, liver or renal disease, OSA , CAD nor CVA.  S/P hysterectomy 11/2021  HYPERTHYROID HISTORY: Pt has been diagnosed with hyperthyroidism in 07/2018 NO prior XRT exposure nor neck sx  No prior amiodarone use    Methimazole  started 08/2018 Pt had an uptake and scan in 05/2019 with an elevated  24-hr uptake of 75% of I-131 S/P RAI ablation on 06/16/2019 with 12.3 mCI of I-131 Pt was started on LT-4 replacement by 09/2019 No FH of thyroid  disease   Pt was seen by Dr. Kassie 10/19 until 06/2021  Patient is s/p phalloplasty on 02/10/2024   SUBJECTIVE:   Today (08/10/2024):  Mr. Lampert is here for a follow up on gender dysphoria and postablative hypothyroidism    Weight has been stable, he continues to be on Zepbound through a general surgery office  Since his last visit here, the patient underwent phalloplasty in April, 2025, with delayed wound healing. He also developed left arm swelling and tingling as that's where they get the graft from . Pending laser sx followed by PT   Last testosterone  injection was 9/26th Denies skin rash  He is tired all the time , he is on oral B12  No acne  Has palpitations  Uses albuterol  for asthma  No  constipation or diarrhea    HOME ENDOCRINE MEDICATIONS: Levothyroxine  175 mcg daily  Testosterone  cypionate 100 mg every 14 days      HISTORY:  Past Medical History:  Past Medical History:  Diagnosis Date   Asthma    Bronchitis    COVID-19    Endometriosis    Hypertension    Migraine    Thyroid  disease    hyperthyroidism per pt report   Past Surgical History:  Past Surgical History:  Procedure Laterality Date   ABDOMINAL HYSTERECTOMY     ANKLE SURGERY     MASTECTOMY Bilateral 2017   male to male    Social History:  reports that he has never smoked. He has never been exposed to tobacco smoke. He has never used smokeless tobacco. He reports current alcohol use. He reports current drug use. Drug: Marijuana. Family History:  Family History  Problem Relation Age of Onset   Cancer Mother        lung   Hypertension Maternal Grandmother    Diabetes Maternal Grandmother    Thyroid  disease Neg Hx      HOME MEDICATIONS: Allergies as of 08/10/2024       Reactions   Frovatriptan Other (See Comments)   Numbness in both legs   Sulfa Antibiotics Other (See Comments), Itching   Makes patients legs numb   Dilaudid  [hydromorphone  Hcl]    Pt says medication make his legs go numb  Thiazide-type Diuretics Other (See Comments)   Oxycodone  Itching   Needs to take in combination with atarax         Medication List        Accurate as of August 10, 2024  8:43 AM. If you have any questions, ask your nurse or doctor.          albuterol  108 (90 Base) MCG/ACT inhaler Commonly known as: VENTOLIN  HFA Inhale 1-2 puffs into the lungs every 6 (six) hours as needed for wheezing or shortness of breath.   buPROPion  150 MG 24 hr tablet Commonly known as: WELLBUTRIN  XL Take 1 tablet (150 mg total) by mouth daily.   busPIRone  10 MG tablet Commonly known as: BUSPAR  Take 1 tablet (10 mg total) by mouth 2 (two) times daily.   cyanocobalamin  1000 MCG tablet Commonly known as:  VITAMIN B12 Take 1,000 mcg by mouth daily.   cyclobenzaprine  10 MG tablet Commonly known as: FLEXERIL  Take 1 tablet (10 mg total) by mouth 2 (two) times daily as needed for muscle spasms.   Flovent  HFA 220 MCG/ACT inhaler Generic drug: fluticasone  Inhale 2 puffs into the lungs twice daily   gabapentin  100 MG capsule Commonly known as: NEURONTIN  Take 100 mg by mouth at bedtime.   ibuprofen  800 MG tablet Commonly known as: ADVIL  Take 1 tablet (800 mg total) by mouth 3 (three) times daily.   levothyroxine  175 MCG tablet Commonly known as: SYNTHROID  Take 1 tablet (175 mcg total) by mouth daily.   metoprolol  succinate 50 MG 24 hr tablet Commonly known as: TOPROL -XL Take 1 tablet by mouth daily.   mirtazapine  15 MG tablet Commonly known as: REMERON  Take 1 tablet (15 mg total) by mouth at bedtime.   naproxen  375 MG tablet Commonly known as: NAPROSYN  Take 1 tablet (375 mg total) by mouth 2 (two) times daily as needed (muscle pain).   Testosterone  Cypionate 200 MG/ML Soln Inject 100 mg as directed every 14 (fourteen) days.   traZODone  50 MG tablet Commonly known as: DESYREL  Take 50 mg by mouth at bedtime as needed for sleep.          OBJECTIVE:   PHYSICAL EXAM: VS: BP 118/72 (BP Location: Left Arm, Patient Position: Sitting, Cuff Size: Large)   Pulse 100   Ht 5' 3 (1.6 m)   Wt 148 lb (67.1 kg)   SpO2 99%   BMI 26.22 kg/m    EXAM: General: Pt appears well and is in NAD  Neck: General: Supple without adenopathy. Thyroid : Thyroid  size normal.  No goiter or nodules appreciated.  Lungs: Clear with good BS bilat  Heart: Auscultation: RRR.  Extremities:  BL LE: No pretibial edema   Mental Status: Judgment, insight: Intact Orientation: Oriented to time, place, and person Mood and affect: No depression, anxiety, or agitation    DATA REVIEWED:  Latest Reference Range & Units 08/02/24 08:51  Testosterone , Total, LC-MS-MS 250 - 1,100 ng/dL 8,656 (H)  TSH 9.59 -  4.50 mIU/L 0.01 (L)  T4,Free(Direct) 0.8 - 1.8 ng/dL 1.6      Old records , labs and images have been reviewed.   ASSESSMENT / PLAN / RECOMMENDATIONS:   Gender Dysphoria   - F- M -S/P  mastectomy and hysterectomy.  - S/P  phalloplasty 02/2024 - His most recent testosterone  was elevated.  When he was on the smaller dose down his testosterone  was 81 ?.  - His godmother injects his testosterone , I did advise the patient that he needs to verify the  dosing before he gets injected by another person, and the dose should be 0.5 mL which is the equivalent to 100 mg every 2 weeks -If this remains elevated we will decrease it    Medications  Continue testosterone  100 mg q. 14 days IM  2. Postablative Hypothyroidism:  -S/P RAI ablation in 2020 - His TSH continues to be suppressed.  He was on levothyroxine  200 mcg dose, but the patient states the pharmacy dispensed 137 mcg dose 2 tablets daily???? - I have resent the prescription of levothyroxine  175   Medication Start levothyroxine  175 mcg daily    Follow-up in 6 months Labs in 6 weeks   Signed electronically by: Stefano Redgie Butts, MD  [facility]  [facility] [address] [place] [place], [place] [address] Phone: [phone] FAX: [phone]      CC: Shelda Atlas, MD [address] [place] [address] Phone: [phone]  Fax: [phone]   Return to Endocrinology clinic as below: No future appointments.

## 2024-08-10 NOTE — Patient Instructions (Signed)

## 2024-09-21 ENCOUNTER — Other Ambulatory Visit

## 2024-09-22 ENCOUNTER — Other Ambulatory Visit: Payer: Self-pay | Admitting: Internal Medicine

## 2024-09-22 ENCOUNTER — Ambulatory Visit: Payer: Self-pay | Admitting: Internal Medicine

## 2024-09-22 LAB — TSH: TSH: <0.01 m[IU]/L — ABNORMAL LOW (ref 0.40–4.50)

## 2024-09-22 MED ORDER — LEVOTHYROXINE SODIUM 150 MCG PO TABS: 150.0000 ug | ORAL_TABLET | Freq: Every day | ORAL | 4 refills | Status: AC

## 2024-10-16 ENCOUNTER — Other Ambulatory Visit: Payer: Self-pay | Admitting: Internal Medicine

## 2025-02-09 ENCOUNTER — Ambulatory Visit: Admitting: Internal Medicine
